# Patient Record
Sex: Male | Born: 1935 | Race: Black or African American | Hispanic: No | Marital: Single | Smoking: Never smoker
Health system: Southern US, Community
[De-identification: ages and names within clinical notes are randomized; demographics above are authoritative.]

## PROBLEM LIST (undated history)

## (undated) DIAGNOSIS — R413 Other amnesia: Secondary | ICD-10-CM

## (undated) DIAGNOSIS — C61 Malignant neoplasm of prostate: Secondary | ICD-10-CM

## (undated) DIAGNOSIS — I639 Cerebral infarction, unspecified: Secondary | ICD-10-CM

## (undated) DIAGNOSIS — H409 Unspecified glaucoma: Secondary | ICD-10-CM

## (undated) DIAGNOSIS — D128 Benign neoplasm of rectum: Secondary | ICD-10-CM

## (undated) HISTORY — DX: Benign neoplasm of rectum: D12.8

## (undated) HISTORY — DX: Other amnesia: R41.3

## (undated) HISTORY — DX: Unspecified glaucoma: H40.9

## (undated) HISTORY — DX: Malignant neoplasm of prostate: C61

---

## 2001-02-04 ENCOUNTER — Ambulatory Visit (HOSPITAL_BASED_OUTPATIENT_CLINIC_OR_DEPARTMENT_OTHER): Admission: RE | Admit: 2001-02-04 | Discharge: 2001-02-04 | Payer: Self-pay | Admitting: Urology

## 2001-12-17 ENCOUNTER — Encounter (HOSPITAL_BASED_OUTPATIENT_CLINIC_OR_DEPARTMENT_OTHER): Payer: Self-pay | Admitting: General Surgery

## 2001-12-22 ENCOUNTER — Ambulatory Visit (HOSPITAL_COMMUNITY): Admission: RE | Admit: 2001-12-22 | Discharge: 2001-12-23 | Payer: Self-pay | Admitting: General Surgery

## 2002-11-05 DIAGNOSIS — C61 Malignant neoplasm of prostate: Secondary | ICD-10-CM

## 2002-11-05 HISTORY — PX: OTHER SURGICAL HISTORY: SHX169

## 2002-11-05 HISTORY — DX: Malignant neoplasm of prostate: C61

## 2003-08-11 ENCOUNTER — Inpatient Hospital Stay (HOSPITAL_COMMUNITY): Admission: EM | Admit: 2003-08-11 | Discharge: 2003-08-12 | Payer: Self-pay | Admitting: Emergency Medicine

## 2003-08-11 ENCOUNTER — Encounter: Payer: Self-pay | Admitting: Emergency Medicine

## 2004-01-06 ENCOUNTER — Ambulatory Visit (HOSPITAL_COMMUNITY): Admission: RE | Admit: 2004-01-06 | Discharge: 2004-01-06 | Payer: Self-pay | Admitting: Ophthalmology

## 2004-02-22 ENCOUNTER — Ambulatory Visit (HOSPITAL_COMMUNITY): Admission: RE | Admit: 2004-02-22 | Discharge: 2004-02-22 | Payer: Self-pay | Admitting: Ophthalmology

## 2004-07-14 ENCOUNTER — Emergency Department (HOSPITAL_COMMUNITY): Admission: EM | Admit: 2004-07-14 | Discharge: 2004-07-14 | Payer: Self-pay | Admitting: Emergency Medicine

## 2005-01-12 ENCOUNTER — Ambulatory Visit: Payer: Self-pay | Admitting: Family Medicine

## 2005-02-22 ENCOUNTER — Ambulatory Visit: Payer: Self-pay | Admitting: Family Medicine

## 2005-03-13 ENCOUNTER — Ambulatory Visit: Payer: Self-pay | Admitting: Family Medicine

## 2005-05-29 ENCOUNTER — Ambulatory Visit: Payer: Self-pay | Admitting: Family Medicine

## 2005-05-30 ENCOUNTER — Ambulatory Visit: Payer: Self-pay | Admitting: Cardiovascular Disease

## 2005-06-01 ENCOUNTER — Ambulatory Visit (HOSPITAL_COMMUNITY): Admission: RE | Admit: 2005-06-01 | Discharge: 2005-06-01 | Payer: Self-pay | Admitting: Family Medicine

## 2005-06-04 ENCOUNTER — Ambulatory Visit: Payer: Self-pay

## 2005-06-08 ENCOUNTER — Ambulatory Visit (HOSPITAL_COMMUNITY): Admission: RE | Admit: 2005-06-08 | Discharge: 2005-06-08 | Payer: Self-pay | Admitting: Family Medicine

## 2005-08-27 ENCOUNTER — Ambulatory Visit: Payer: Self-pay | Admitting: Family Medicine

## 2006-03-07 ENCOUNTER — Ambulatory Visit: Payer: Self-pay | Admitting: Gastroenterology

## 2006-04-04 ENCOUNTER — Ambulatory Visit: Payer: Self-pay | Admitting: Family Medicine

## 2006-05-06 ENCOUNTER — Ambulatory Visit: Payer: Self-pay | Admitting: Family Medicine

## 2007-12-25 ENCOUNTER — Ambulatory Visit (HOSPITAL_COMMUNITY): Admission: RE | Admit: 2007-12-25 | Discharge: 2007-12-25 | Payer: Self-pay | Admitting: Ophthalmology

## 2008-02-10 ENCOUNTER — Ambulatory Visit: Admission: RE | Admit: 2008-02-10 | Discharge: 2008-05-10 | Payer: Self-pay | Admitting: Radiation Oncology

## 2008-03-24 ENCOUNTER — Ambulatory Visit (HOSPITAL_BASED_OUTPATIENT_CLINIC_OR_DEPARTMENT_OTHER): Admission: RE | Admit: 2008-03-24 | Discharge: 2008-03-24 | Payer: Self-pay | Admitting: Urology

## 2008-05-11 ENCOUNTER — Ambulatory Visit: Admission: RE | Admit: 2008-05-11 | Discharge: 2008-05-16 | Payer: Self-pay | Admitting: Radiation Oncology

## 2008-05-19 ENCOUNTER — Ambulatory Visit (HOSPITAL_COMMUNITY): Admission: RE | Admit: 2008-05-19 | Discharge: 2008-05-19 | Payer: Self-pay | Admitting: Ophthalmology

## 2010-07-20 ENCOUNTER — Encounter (INDEPENDENT_AMBULATORY_CARE_PROVIDER_SITE_OTHER): Payer: Self-pay | Admitting: *Deleted

## 2010-12-05 NOTE — Letter (Signed)
Summary: Colonoscopy Date Change Letter  [facility]  [address] [place], [place] [address]   Phone: [phone]  Fax: [phone]      July 20, 2010 MRN: [id number]   [facility] [address] [place], [place]  [address]   Dear [name],   Previously you were recommended to have a repeat colonoscopy around this time. Your chart was recently reviewed by Dr. Claudette Head of [facility]. Follow up colonoscopy is now recommended in November 2014. This revised recommendation is based on current, nationally recognized guidelines for colorectal cancer screening and polyp surveillance. These guidelines are endorsed by the American Cancer Society, [employer] on Colorectal Cancer as well as numerous other major medical organizations.  Please understand that our recommendation assumes that you do not have any new symptoms such as bleeding, a change in bowel habits, anemia, or significant abdominal discomfort. If you do have any concerning GI symptoms or want to discuss the guideline recommendations, please call to arrange an office visit at your earliest convenience. Otherwise we will keep you in our reminder system and contact you 1-2 months prior to the date listed above to schedule your next colonoscopy.  Thank you,  Judie Petit T. Russella Dar, M.D.  [facility] [phone]

## 2011-03-20 NOTE — Op Note (Signed)
NAME:  [name], [name]                ACCOUNT NO.:  [id number]   MEDICAL RECORD NO.:  [id number]          PATIENT TYPE:  AMB   LOCATION:  [facility]                         FACILITY:  [facility]   PHYSICIAN:  Lindaann Slough, M.D.  DATE OF BIRTH:  Oct 19, 1932   DATE OF PROCEDURE:  03/24/2008  DATE OF DISCHARGE:                               OPERATIVE REPORT   PREOPERATIVE DIAGNOSIS:  Adenocarcinoma of prostate.   POSTOPERATIVE DIAGNOSIS:  Adenocarcinoma of prostate.   PROCEDURE DONE:  I-125 seed implantation.   SURGEON:  Danae Chen, M.D. and Maryln Gottron, M.D.   ANESTHESIA:  General.   INDICATION:  The patient is a 77 year old male who had a positive  prostate biopsy for adenocarcinoma of prostate on January 19, 2008.  Gleason score 6.  PSA at diagnosis was 5.70.  Treatment options were  discussed with the patient and he elected to have seeds implantation.  He is scheduled today for the procedure.   Under general anesthesia, the patient was prepped and draped and placed  in the dorsal lithotomy position.  A Foley catheter was inserted in the  bladder.  A rectal tube was placed in the rectum and ultrasound planning  was done by Dr. Dayton Scrape.  When ultrasound planning was completed, a total  of 59 seeds were inserted in the prostate using 24 needles.  The  procedure was done under ultrasound guidance and using the Nucletron.  Then the Foley catheter was then removed.  A flexible cystoscope was  then inserted in the bladder.  The anterior urethra is normal.  He has  moderate prostatic hypertrophy.  The bladder mucosa is normal.  There is  no stone, seed or tumor in the bladder.  The ureteral orifices are in  normal position and shape with clear efflux.  The cystoscope was then  removed.  A #16 Foley catheter was then inserted in the bladder.   The patient tolerated the procedure well and left the OR in satisfactory  condition to post anesthesia care unit.      Lindaann Slough, M.D.  Electronically Signed     MN/MEDQ  D:  03/24/2008  T:  03/24/2008  Job:  [id number]   cc:   Maryln Gottron, M.D.  Fax: [phone]   Delaney Meigs, M.D.  Fax: [phone]

## 2011-03-20 NOTE — Op Note (Signed)
NAMEANTONE, SUMMONS                ACCOUNT NO.:  [id number]   MEDICAL RECORD NO.:  [id number]          PATIENT TYPE:  AMB   LOCATION:  SDS                          FACILITY:  [facility]   PHYSICIAN:  Salley Scarlet., M.D.DATE OF BIRTH:  [birth year]   DATE OF PROCEDURE:  12/25/2007  DATE OF DISCHARGE:  12/25/2007                               OPERATIVE REPORT      Salley Scarlet., M.D.  Electronically Signed     TB/MEDQ  D:  12/25/2007  T:  12/26/2007  Job:  [id number]

## 2011-03-23 NOTE — Op Note (Signed)
NAMEKENNEY, GOING                            ACCOUNT NO.:  [id number]   MEDICAL RECORD NO.:  [id number]                   PATIENT TYPE:  OIB   LOCATION:  2855                                 FACILITY:  [facility]   PHYSICIAN:  Salley Scarlet., M.D.         DATE OF BIRTH:  05/30/1932   DATE OF PROCEDURE:  01/06/2004  DATE OF DISCHARGE:  01/06/2004                                 OPERATIVE REPORT   PREOPERATIVE DIAGNOSIS:  Immature cataract right eye.   POSTOPERATIVE DIAGNOSIS:  Immature cataract right eye.   PROCEDURE:  Kelman phacoemulsification cataract with intraocular lens  implantation right eye.   SURGEON:  Nadyne Coombes, M.D.   ANESTHESIA:  Local using Xylocaine 2% with Marcaine 0.75% Wydase.   JUSTIFICATION FOR PROCEDURE:  This is a 77 year old diabetic gentleman who  complains of blurring of vision with difficulty seeing to read.  He was  evaluated and found to have a visual acuity best corrected as 20/70 on the  right and 20/50 on the left.  There were bilateral immature cataracts worse  on the right than the left.  Cataract extraction of the right eye was  recommended.  He was admitted, at this time, for that purpose.   DESCRIPTION OF PROCEDURE:  Under the influence with IV sedation, akinesia  and retrobulbar anesthesia was given.  The patient was prepped and draped in  the usual manner.  The lid speculum was inserted under the upper lower lid  of the right eye and a 4-0 silk traction suture was passed through the belly  of the superior rectus muscle retraction.  A Fornix based conjunctival flap  was turned and hemostasis was achieved by using cautery.  An incision was  made in the sclera approximately 1 mm posterior to the limbus.  This  incision was dissected down into the cornea using a crescent blade.  A strap  wound incision was made at the 135 position. Occucoat was injected into the  eye through the side port incision.   The anterior chamber was then  entered through the corneoscleral tunnel  incision at the 11:30 o'clock position.  An anterior capsulotomy was done  using the 25-gauge needle.  The nucleus was hydrodissected using Xylocaine.  The KTP handpiece was passed into the eye and the nucleus was emulsified  without difficulty.  The residual cortical material was aspirated.  The  posterior capsule was polished using high tech polisher.  A foldable  posterior chamber lens was then inserted into the eye behind the iris  without difficulty.  The anterior chamber was reformed and pupil was  constricted using Miochol.  The corneoscleral wound was tested to make sure  that there was no leak.   The conjunctiva was then closed over the wound using thermal cautery. One cc  of Celestone, 0.5 cc of gentamicin were injected subconjunctivally.  Maxitrol ophthalmic ointment and pilocarpine  ointment were applied along  with a  patch and Fox shield.  The patient tolerated the procedure well and  was discharged to the post-anesthesia care unit in  satisfactory condition.  He was instructed to rest today, to take Vicodin  q.4h. as needed for pain and to see me in the office tomorrow for further  evaluation.   DISCHARGE DIAGNOSIS:  Immature cataract right eye.                                               Salley Scarlet., M.D.    TB/MEDQ  D:  01/06/2004  T:  01/08/2004  Job:  [id number]

## 2011-03-23 NOTE — Discharge Summary (Signed)
NAMEELENA, COTHERN                            ACCOUNT NO.:  [id number]   MEDICAL RECORD NO.:  [id number]                   PATIENT TYPE:  INP   LOCATION:  0353                                 FACILITY:  [facility]   PHYSICIAN:  Malcolm T. Russella Dar, M.D. [facility]          DATE OF BIRTH:  1931/11/11   DATE OF ADMISSION:  08/11/2003  DATE OF DISCHARGE:  08/12/2003                                 DISCHARGE SUMMARY   ADMISSION DIAGNOSES:  46. A 77 year old, white male with hematemesis and syncope, rule out Mallory-     Weiss tear, rule out peptic ulcer disease.  2. Recent gum infection on clindamycin.  3. Remote inguinal hernia repair.  4. Status post bladder surgery, type unclear.   DISCHARGE DIAGNOSES:  1. Stable, status post acute upper gastrointestinal bleed secondary to     Mallory-Weiss tear, self-limited with no endoscopic intervention     required.  2. Incidental antral ulcer.  3. Anemia, normocytic secondary to above.   CONSULTATIONS:  None.   PROCEDURE:  Upper endoscopy.   HISTORY OF PRESENT ILLNESS:  Mr. [name] is a pleasant, 77 year old, African-  American male generally in good health with history of remote peptic ulcer  disease in the 1980s, but no history of GI bleeding that he can recall.  He  has had problems with his gums recently and has been having some scaling  done and has been on clindamycin.  He says he has been having some  indigestion and epigastric discomfort over the past week or so for which he  had been taking a couple of Advil per day.  He denies any heartburn,  dysphagia or odynophagia.  His appetite has been fine and he had not been  experiencing any nausea.  Earlier on the morning of admission, he became  rather acutely nauseated and then vomited x1 with a large amount of bright  red blood and clots.  He says he became dizzy and had a subsequent syncope  episode slumping down, but without any apparent injury and the patient  reports that he does not feel that  he hit his head.  He was brought to the  emergency room and found to be orthostatic, but not hypotensive and has been  stable in the emergency room.  His stool is dark and heme positive and he  has evidence of dried dark blood on his shoes and slacks.  Labs in the ER  showed a hemoglobin of 13.4, hematocrit 39.2, coagulations were normal.  BUN  was slightly elevated at 31, creatinine 1.  He was admitted to the hospital  with an acute upper GI bleed, rule out peptic ulcer disease versus Mallory-  Weiss tear for observation and upper endoscopy.   LABORATORY DATA AND X-RAY FINDINGS:  On admission, WBC 14.4, hemoglobin  13.4, hematocrit 39.2, platelets 312.  Electrolytes within normal limits.  BUN 31, creatinine 1.  UA was negative.  Follow up on October 7, with  hemoglobin down to 10.2 with hematocrit of 30.2.  H. pylori antibody was  drawn and is pending at the time of dictation.   HOSPITAL COURSE:  The patient was admitted to the service of Dr. Claudette Head who was covering the hospital.  He was initially placed on telemetry  and kept at bed rest, hydrated with serial H&H's obtained.  He was covered  with IV Protonix.  He did not have any further evidence of active bleeding,  no further hematemesis.  He underwent upper endoscopy on the morning of  October 7, and was found to have a Mallory-Weiss tear in the gastric cardia  which was not bleeding and did not require any therapy.  He was also noted  to have a 6 mm antral ulcer which was clean based.  CLOtest was done and  also noted duodenal erosions.  As it was felt that he was stable and was not  having any evidence of further active bleeding, he was allowed discharge to  home on October 7, with plans to have followup CBC in five days.  He was  scheduled to see Dr. Russella Dar in the office on October 21, at 11:15 a.m. and at  that time will discuss outpatient colonoscopy as well for surveillance.   DISCHARGE MEDICATIONS:  1. Zyrtec 10 mg  q.d.  2. Nexium 40 mg q.d.   SPECIAL INSTRUCTIONS:  Avoid all aspirin and aspirin products.  He could  continue his clindamycin if he did not feel it was causing any problems with  nausea.   DIET:  Full liquids on the day of discharge and advance to soft over the  next two to three days and then regular as tolerated.      Amy Esterwood, P.A.-C. [facility]                Malcolm T. Russella Dar, M.D. [facility]    AE/MEDQ  D:  08/23/2003  T:  08/23/2003  Job:  [id number]   cc:   Colon Flattery, MD  [address]  [place]  [place] [address]  Fax: [phone]

## 2011-03-23 NOTE — Op Note (Signed)
[facility]  Patient:    [name], [name] Visit Number: [id number] MRN: [id number]          Service Type: DSU Location: 3000 [id number] Attending Physician:  Sonda Primes Dictated by:   Mardene Celeste. Lurene Shadow, M.D. Proc. Date: 12/22/01 Admit Date:  12/22/2001 Discharge Date: 12/23/2001                             Operative Report  PREOPERATIVE DIAGNOSIS: Bilateral inguinal hernias.  POSTOPERATIVE DIAGNOSIS: Bilateral inguinal hernias.  OPERATION/PROCEDURE: Repair of bilateral inguinal hernias with mesh.  SURGEON: Mardene Celeste. Lurene Shadow, M.D.  ASSISTANT: Nurse.  ANESTHESIA: General.  INDICATIONS FOR PROCEDURE: The patient is a 77 year old man, presenting with bilateral inguinal hernias, the right side being greater than the left.  The hernias have been essentially asymptomatic but the right one had been increasing in size over the past few months.  The risks and benefits of surgery for hernia repair have been discussed with the patient, all questions answered, and consent obtained.  DESCRIPTION OF PROCEDURE: Following the induction of satisfactory general anesthesia, with the patient positioned supine, the lower abdomen was prepped and draped to be included in the sterile operative field.  I started on the left side.  I infiltrated the lower abdominal crease with 0.5% Marcaine with epinephrine 1:200,000 concentration.  I made a transverse incision in the lower abdominal crease and deepened this throughout the skin and subcutaneous tissues, carrying dissection down to the external oblique aponeurosis.  The external oblique aponeurosis was opened up through the external inguinal ring, with traction on the ilioinguinal nerve medially and cephalad.  The spermatic cord was then dissected free and elevated with a Penrose drain.  Dissection along the anterior medial aspect of the cord did not reveal any evidence of an indirect sac.  There was a  modestly large and prolapsing left inguinal hernia in the direct space.  This was repaired with Marlex mesh sewn in at the pubic tubercle, carried up along the conjoined tendon to the internal ring, and again from the pubic tubercle up along the shelving edge of Pouparts ligament to the internal ring.  Mesh was split so as to allow the cord to protrude. Tails of the mesh were then trimmed and sutured down into the internal oblique muscle completely occluding the internal ring save for the spermatic cord. Sponge, instrument, and sharp counts on the left side were then verified.  The external oblique aponeurosis was then closed with a running suture of 2-0 Vicryl after all the areas had been suctioned and checked for hemostasis and noted to be dry.  Subcutaneous and Scarpas fascia were closed with a running suture of 3-0 Vicryl.  Attention was then turned to the right side. Similarly, the right side was infiltrated with 0.5% Marcaine with epinephrine 1:200,000.  A transverse incision was made symmetrically on the right side, with dissection carried down into the external oblique aponeurosis.  The aponeurosis was opened up throughout the external inguinal hernia ring with retraction of the ilioinguinal nerve laterally and inferiorly.  Spermatic cord was elevated and held with a Penrose drain.  A rather large direct hernia could be seen immediately.  There was no evidence of an indirect hernia on dissection within the spermatic cord.  The hernia was repaired onlay mesh of polypropylene starting at the pubic tubercle and carried up along the conjoined tendon with a 2-0 Novofil suture up to the internal  ring, and again from the pubic tubercle up along the the shelving edge of Pouparts ligament to the internal ring.  The mesh was split so as to allow the cord to protrude and the tails of the mesh were trimmed and then sutured down at the internal oblique muscle, completing the occlusion of the  internal ring.  Again, sponge, instruments, and sharp counts were verified.  All areas of dissection were checked for hemostasis and noted to be dry.  External oblique aponeurosis was closed with a running suture of 2-0 Vicryl, Scarpas fascia and subcutaneous tissues closed with running 3-0 Vicryl.  Skin was closed on both sides with running sutures of 4-0 Monocryl and then reinforced with Steri-Strips.  A sterile compressive dressing was applied to both wounds.  The anesthetic was reversed, the patient removed from the operating room to the recovery room in stable condition.  He tolerated the procedure well. Dictated by:   Mardene Celeste. Lurene Shadow, M.D. Attending Physician:  Sonda Primes DD:  12/22/01 TD:  12/22/01 Job: [id number]

## 2011-03-23 NOTE — Op Note (Signed)
NAME:  JARYN, ROSKO                ACCOUNT NO.:  [id number]   MEDICAL RECORD NO.:  [id number]          PATIENT TYPE:  AMB   LOCATION:  SDS                          FACILITY:  [facility]   PHYSICIAN:  Salley Scarlet., M.D.DATE OF BIRTH:  1932-02-21   DATE OF PROCEDURE:  01/09/2008  DATE OF DISCHARGE:  12/25/2007                               OPERATIVE REPORT   PREOPERATIVE DIAGNOSIS:  Immature cataract, left eye.   POSTOPERATIVE DIAGNOSIS:  Immature cataract, left eye.   PROCEDURE:  Cataract extraction, left eye, with intraocular lens  implantation.   ANESTHESIA:  Local using Xylocaine 2% with Marcaine, 0.75% Wydase.   JUSTIFICATION FOR PROCEDURE:  This is a 77 year old gentleman who  underwent a cataract extraction of the right eye with good visual  results several months ago.  He complains of inability to see from the  left eye which causes him difficulty seeing to read and drive.  He was  evaluated and found have a dense posterior subcapsular cataract of the  left eye.  Cataract extraction of the left eye was recommended.  He is  admitted at this time for that purpose.   PROCEDURE:  Under the influence of IV sedation, Van Lint akinesia and  retrobulbar anesthesia was given.  The patient was prepped and draped in  the usual manner.  A lid speculum was inserted under the upper and lower  lid of the left eye, and a 4-0 silk traction suture was passed through  the belly of the superior rectus muscle for retraction.  A fornix-based  conjunctival flap was turned, and hemostasis was achieved using cautery.  An incision was made in the sclera at the limbus.  This incision was  dissected down to clear cornea using a crescent blade.  A sideport  incision was made at the 1:30 o'clock position.  Ocucoat was injected  into the eye through the sideport incision.  The anterior chamber was  then entered through the corneoscleral tunnel incision at the 11:30  o'clock position.  An anterior  capsulotomy was done using a bent 25-  gauge needle.  The nucleus was hydrodissected using Xylocaine.  The KPE  handpiece was passed into the eye, and the nucleus was emulsified  without difficulty.  The residual cortical material was aspirated.  The  posterior capsule was polished using an olive tip polisher.  The wound  was widened slightly to accommodate a foldable silicone lens.  The lens  was seated into the eye behind the iris without difficulty.  The  anterior chamber was reformed, and the pupil was constricted using  Miochol.  The lips of the wound were hydrated and tested to make sure  that there was no leak.  After ascertaining that there was no leak, the  conjunctiva was closed over the wound using thermal cautery.  1 mL of  Celestone and 0.5 mL of gentamicin were injected subconjunctivally.  Maxitrol ophthalmic ointment and prilocaine antibiotic ointment were  applied along with a patch and Fox shield.   The patient tolerated the procedure well and was discharged to the  postanesthesia recovery room  in satisfactory condition.  He was  instructed to rest today, to take Vicodin every 4 hours as needed for  pain, and to see me in office tomorrow for further evaluation.   DISCHARGE DIAGNOSIS:  Immature cataract, left eye.      Salley Scarlet., M.D.  Electronically Signed     TB/MEDQ  D:  01/09/2008  T:  01/10/2008  Job:  [id number]

## 2011-03-23 NOTE — H&P (Signed)
NAMEANTONIA, [name]                            ACCOUNT NO.:  [id number]   MEDICAL RECORD NO.:  [id number]                   PATIENT TYPE:  INP   LOCATION:  0353                                 FACILITY:  [facility]   PHYSICIAN:  Malcolm T. Russella Dar, M.D. [facility]          DATE OF BIRTH:  October 25, 1932   DATE OF ADMISSION:  08/11/2003  DATE OF DISCHARGE:                                HISTORY & PHYSICAL   CHIEF COMPLAINT:  Vomiting blood.   HISTORY:  [name] is a pleasant 77 year old African-American male  generally in good health who has history of peptic ulcer disease in the  1980s, no prior history of GI bleeding.  He does have seasonal allergies and  a recent gum infection for which he is on clindamycin.  He relates he had  had some indigestion and epigastric discomfort over the past week for which  he had been taking some Advil two to three per day.  He denied any  heartburn.  No dysphagia or odynophagia.  His appetite has been fine.  His  weight has been stable.  Earlier on the morning of admission he became  nauseated and vomited x1 with a large amount of bright red blood and clots.  He says he became dizzy and had a subsequent syncopal episode slumping  down, but apparently had no obvious injury and does not feel that he hit his  head.  He was brought to the emergency room, found to be orthostatic, but  not hypotensive and has been stable.  His stool is dark, but heme-positive.  He does have dark dried blood on his shoes and slacks.  Laboratories in the  ER showed a hemoglobin of 13.4, hematocrit of 39.2, WBC of 14, platelets  312,000, pro time 12.9, INR 1, potassium 4.1, BUN 31, creatinine 1.0.  UA is  negative.  EKG showed normal sinus rhythm.  He is admitted at this time with  an acute upper GI bleed, rule out peptic ulcer disease versus Mallory-Weiss  tear.   CURRENT MEDICATIONS:  1. Clindamycin 150 t.i.d.  2. Zyrtec 10 daily.   ALLERGIES:  No known drug allergies.   PAST  MEDICAL HISTORY:  1. Remote inguinal hernia repair.  2. Status post bladder surgery two to three years ago.  He is uncertain of     the exact procedure.   FAMILY HISTORY:  Pertinent for diabetes mellitus.   SOCIAL HISTORY:  The patient lives alone.  Has grown children here in town.  He is retired from [employer].  He has a large extended family with several family  members present in the emergency room.  He had moved back to [place] from  [place] within the past couple of years.   REVIEW OF SYSTEMS:  Reviewed and cardiovascular, pulmonary, genitourinary,  musculoskeletal all negative.  Specifically, denies any chest pain or  anginal symptoms prior to his syncopal episode.  No shortness of  breath.  Denies any pain or discomfort post fall.  Specifically, denies headache.   PHYSICAL EXAMINATION:  GENERAL:  Well-developed African-American male in no  acute distress.  He is alert and oriented x3.  VITAL SIGNS:  Temperature 97, blood pressure 142/64, pulse in the 70s.  HEENT:  Normocephalic, atraumatic.  EOMI.  PERLA.  Sclerae anicteric.  NECK:  Supple without nodes.  CARDIOVASCULAR:  Regular rate and rhythm with S1 and S2.  PULMONARY:  Clear to A&P.  ABDOMEN:  Soft, basically nontender.  No mass or hepatosplenomegaly.  Bowel  sounds active.  RECTAL:  Per the ER.  Dark green stool.  4+ positive for occult blood.  EXTREMITIES:  Without clubbing, cyanosis, edema.  Pulses are equal.  NEUROLOGIC:  Nonfocal.   IMPRESSION:  24. A 77 year old African-American male with acute upper gastrointestinal     bleed with syncope and orthostasis currently stable.  Rule out Mallory-     Weiss tear.  Rule out peptic ulcer disease.  2. Recent gum infection on clindamycin.  3. Remote hernia repair.  4. Status post bladder surgery, type unclear.   PLAN:  The patient is admitted to the service of Judie Petit T. Russella Dar, M.D. [facility]  to telemetry.  He will be hydrated, volume repleted, serial H&H, then  transfusions  as indicated.  Have scheduled upper endoscopy for early a.m.  This will be done emergently if he becomes unstable in the interim.  He will  be placed on IV PPI and for further details please see the orders.     Amy Esterwood, P.A.-C. [facility]                Malcolm T. Russella Dar, M.D. [facility]    AE/MEDQ  D:  08/12/2003  T:  08/12/2003  Job:  [id number]   cc:   Colon Flattery, MD  [address]  [place]  [place] [address]  Fax: [phone]

## 2011-07-27 LAB — URINE MICROSCOPIC-ADD ON

## 2011-07-27 LAB — CBC
HCT: 39.6
MCHC: 34
MCV: 92.6
RBC: 4.28
WBC: 7.8

## 2011-07-27 LAB — URINALYSIS, ROUTINE W REFLEX MICROSCOPIC
Bilirubin Urine: NEGATIVE
Glucose, UA: NEGATIVE
Hgb urine dipstick: NEGATIVE
Ketones, ur: NEGATIVE
Protein, ur: NEGATIVE
pH: 5.5

## 2011-07-27 LAB — BASIC METABOLIC PANEL
BUN: 16
CO2: 25
Chloride: 107
Creatinine, Ser: 1.11
GFR calc Af Amer: >60
Potassium: 4.2

## 2011-08-01 LAB — COMPREHENSIVE METABOLIC PANEL
ALT: 20
AST: 21
Alkaline Phosphatase: 67
CO2: 28
Calcium: 9.5
Chloride: 104
GFR calc Af Amer: >60
GFR calc non Af Amer: >60
Glucose, Bld: 68 — ABNORMAL LOW
Potassium: 4.7
Sodium: 138

## 2011-08-01 LAB — CBC
Hemoglobin: 13.7
MCHC: 33.5
RBC: 4.45
WBC: 7

## 2011-08-01 LAB — PROTIME-INR: Prothrombin Time: 13.2

## 2013-06-19 ENCOUNTER — Encounter: Payer: Self-pay | Admitting: Gastroenterology

## 2013-07-29 ENCOUNTER — Encounter: Payer: Self-pay | Admitting: Gastroenterology

## 2013-09-24 ENCOUNTER — Ambulatory Visit (AMBULATORY_SURGERY_CENTER): Payer: Self-pay | Admitting: *Deleted

## 2013-09-24 VITALS — Ht 71.0 in | Wt 172.2 lb

## 2013-09-24 DIAGNOSIS — Z1211 Encounter for screening for malignant neoplasm of colon: Secondary | ICD-10-CM

## 2013-09-24 MED ORDER — MOVIPREP 100 G PO SOLR: ORAL | Status: DC

## 2013-09-25 ENCOUNTER — Encounter: Payer: Self-pay | Admitting: Gastroenterology

## 2013-10-08 ENCOUNTER — Ambulatory Visit (AMBULATORY_SURGERY_CENTER): Payer: Medicare Other | Admitting: Gastroenterology

## 2013-10-08 ENCOUNTER — Encounter: Payer: Self-pay | Admitting: Gastroenterology

## 2013-10-08 VITALS — BP 133/76 | HR 51 | Temp 97.3°F | Resp 15 | Ht 71.0 in | Wt 172.0 lb

## 2013-10-08 DIAGNOSIS — D126 Benign neoplasm of colon, unspecified: Secondary | ICD-10-CM

## 2013-10-08 DIAGNOSIS — Z1211 Encounter for screening for malignant neoplasm of colon: Secondary | ICD-10-CM

## 2013-10-08 DIAGNOSIS — D128 Benign neoplasm of rectum: Secondary | ICD-10-CM

## 2013-10-08 HISTORY — DX: Benign neoplasm of rectum: D12.8

## 2013-10-08 MED ORDER — SODIUM CHLORIDE 0.9 % IV SOLN: 500.0000 mL | INTRAVENOUS | Status: DC

## 2013-10-08 NOTE — Progress Notes (Signed)
Report to pacu rn, vss, bbs=clear 

## 2013-10-08 NOTE — Op Note (Signed)
[facility] [address] [place], [address]   COLONOSCOPY PROCEDURE REPORT  PATIENT: [name], [name]  MR#: [id number] BIRTHDATE: [birth year] , 81  yrs. old GENDER: Male ENDOSCOPIST: Meryl Dare, MD, [facility] PROCEDURE DATE:  10/08/2013 PROCEDURE:   Colonoscopy with biopsy and snare polypectomy First Screening Colonoscopy - Avg.  risk and is 50 yrs.  old or older - No.  Prior Negative Screening - Now for repeat screening. 10 or more years since last screening  History of Adenoma - Now for follow-up colonoscopy & has been > or = to 3 yrs.  N/A  Polyps Removed Today? Yes. ASA CLASS:   Class II INDICATIONS:average risk screening. MEDICATIONS: MAC sedation, administered by CRNA and propofol (Diprivan) 230mg  IV DESCRIPTION OF PROCEDURE:   After the risks benefits and alternatives of the procedure were thoroughly explained, informed consent was obtained.  A digital rectal exam revealed no abnormalities of the rectum.   The LB [id number]  endoscope was introduced through the anus and advanced to the cecum, which was identified by both the appendix and ileocecal valve. No adverse events experienced.   The quality of the prep was good, using MoviPrep  The instrument was then slowly withdrawn as the colon was fully examined.  COLON FINDINGS: Two sessile polyps measuring 4-5 mm in size were found in the transverse colon.  A polypectomy was performed with a cold snare and with cold forceps.   The colon was otherwise normal. There was no diverticulosis, inflammation, polyps or cancers unless previously stated.  Retroflexed views revealed small internal hemorrhoids. The time to cecum=3 minutes 29 seconds. Withdrawal time=9 minutes 38 seconds.  The scope was withdrawn and the procedure completed. COMPLICATIONS: There were no complications.  ENDOSCOPIC IMPRESSION: 1.   Two sessile polyps measuring 4-5 mm in the transverse colon; polypectomy performed with a cold snare  and with cold forceps 2.   Small internal hemorrhoids  RECOMMENDATIONS: 1.  Await pathology results 2.  Given your age, you will not need another colonoscopy for colon cancer screening or polyp surveillance.  These types of tests usually stop around the age 76.  eSigned:  Meryl Dare, MD, [facility] 10/08/2013 8:28 AM   cc: Joette Catching, MD

## 2013-10-08 NOTE — Patient Instructions (Signed)

## 2013-10-08 NOTE — Progress Notes (Signed)
Called to room to assist during endoscopic procedure.  Patient ID and intended procedure confirmed with present staff. Received instructions for my participation in the procedure from the performing physician.  

## 2013-10-08 NOTE — Progress Notes (Signed)
Pt rolled to right side.  HOB dropped with feet up.  Pt encouraged to pass gas. Maw

## 2013-10-08 NOTE — Progress Notes (Signed)
Pt did pass a good amount of flatus.  Pt's abdomen is soft.  No complaints noted.  Maw

## 2013-10-09 ENCOUNTER — Telehealth: Payer: Self-pay

## 2013-10-09 NOTE — Telephone Encounter (Signed)
  Follow up Call-  Call back number 10/08/2013  Post procedure Call Back phone  # [phone]  Permission to leave phone message Yes     Patient questions:  Do you have a fever, pain , or abdominal swelling? no Pain Score  0 *  Have you tolerated food without any problems? yes  Have you been able to return to your normal activities? yes  Do you have any questions about your discharge instructions: Diet   no Medications  no Follow up visit  no  Do you have questions or concerns about your Care? no  Actions: * If pain score is 4 or above: No action needed, pain <4.

## 2013-10-13 ENCOUNTER — Encounter: Payer: Self-pay | Admitting: Gastroenterology

## 2013-10-13 ENCOUNTER — Telehealth: Payer: Self-pay | Admitting: Gastroenterology

## 2013-10-13 NOTE — Telephone Encounter (Signed)
Patient c/o right sided abdominal pain since the procedure on 10/08/13.  He had two polyps removed via cold snare and cold forceps.  He denies fever,  rectal bleeding or constipation. He has not had any vomiting for the last two days.  Upon further questioning the patient admits to the same pain being present prior to the procedure.  He will come in and see Willette Cluster RNP tomorrow at 10:00

## 2013-10-13 NOTE — Telephone Encounter (Signed)
Agree with office evaluation tomorrow.  I don't think this is procedure related.

## 2013-10-14 ENCOUNTER — Ambulatory Visit (INDEPENDENT_AMBULATORY_CARE_PROVIDER_SITE_OTHER): Payer: Medicare Other | Admitting: Nurse Practitioner

## 2013-10-14 ENCOUNTER — Other Ambulatory Visit (INDEPENDENT_AMBULATORY_CARE_PROVIDER_SITE_OTHER): Payer: Medicare Other

## 2013-10-14 ENCOUNTER — Encounter: Payer: Self-pay | Admitting: Nurse Practitioner

## 2013-10-14 VITALS — BP 128/64 | HR 74 | Ht 71.0 in | Wt 167.0 lb

## 2013-10-14 DIAGNOSIS — R109 Unspecified abdominal pain: Secondary | ICD-10-CM | POA: Insufficient documentation

## 2013-10-14 DIAGNOSIS — R112 Nausea with vomiting, unspecified: Secondary | ICD-10-CM | POA: Insufficient documentation

## 2013-10-14 LAB — CBC WITH DIFFERENTIAL/PLATELET
Basophils Relative: 0.1 % (ref 0.0–3.0)
Eosinophils Absolute: 0 10*3/uL (ref 0.0–0.7)
Eosinophils Relative: 0 % (ref 0.0–5.0)
HCT: 40.7 % (ref 39.0–52.0)
Lymphs Abs: 1.2 10*3/uL (ref 0.7–4.0)
MCHC: 34.2 g/dL (ref 30.0–36.0)
MCV: 91.8 fl (ref 78.0–100.0)
Monocytes Absolute: 1.6 10*3/uL — ABNORMAL HIGH (ref 0.1–1.0)
Neutrophils Relative %: 82.9 % — ABNORMAL HIGH (ref 43.0–77.0)
Platelets: 288 10*3/uL (ref 150.0–400.0)
WBC: 16.4 10*3/uL — ABNORMAL HIGH (ref 4.5–10.5)

## 2013-10-14 LAB — COMPREHENSIVE METABOLIC PANEL
AST: 16 U/L (ref 0–37)
Alkaline Phosphatase: 64 U/L (ref 39–117)
BUN: 16 mg/dL (ref 6–23)
Glucose, Bld: 121 mg/dL — ABNORMAL HIGH (ref 70–99)
Sodium: 141 mEq/L (ref 135–145)
Total Bilirubin: 2.9 mg/dL — ABNORMAL HIGH (ref 0.3–1.2)

## 2013-10-14 MED ORDER — METHOCARBAMOL 500 MG PO TABS: ORAL_TABLET | ORAL | Status: DC

## 2013-10-14 MED ORDER — TRAMADOL HCL 50 MG PO TABS: 50.0000 mg | ORAL_TABLET | Freq: Four times a day (QID) | ORAL | Status: DC | PRN

## 2013-10-14 MED ORDER — ESOMEPRAZOLE MAGNESIUM 40 MG PO CPDR: 40.0000 mg | DELAYED_RELEASE_CAPSULE | Freq: Every day | ORAL | Status: DC

## 2013-10-14 NOTE — Patient Instructions (Addendum)
Please go to the basement level to have your labs drawn.  We faxed a signed prescription for Tramadol to your pharmacy. Take over the counter Motrin 200 mg, Take 1 tab twice daily.  We sent prescriptions to [facility]. 1. Robaxin 2. Ultram ( Tramadol ) for pain 3. Nexium 40 mg. Take 1 tab in the am   For 14 days.

## 2013-10-14 NOTE — Progress Notes (Signed)
     History of Present Illness:  Patient is a relatively healthy 79 year old male who had a screening colonoscopy with [place] 10/08/13 . He is here today for evaluation of abdominal pain, nausea and vomiting which started following the procedure . According to the telephone call note yesterday, symptoms were probably present prior to procedure but patient adamantly denies that today . The right sided abdominal pain is intermittent, mainly positional. Patient has slept in a rocking chair since the procedure because it hurts to lay on left side. Pain is also exacerbated by twisting.  It can be uncomfortable to walk. Pain is not related to eating . Since colonoscopy patient has also had several episodes of nausea and vomiting, as recent as yesterday. His appetite has diminished. No fevers. He is not having much in the way of bowel movements since colonoscopy.  Current Medications, Allergies, Past Medical History, Past Surgical History, Family History and Social History were reviewed in [employer] record.  Physical Exam: General: Pleasant, well developed , black male in no acute distress Head: Normocephalic and atraumatic Eyes:  sclerae anicteric, conjunctiva pink  Ears: Normal auditory acuity Lungs: Clear throughout to auscultation Heart: Regular rate and rhythm Abdomen: Soft, non distended, mild right mid abdominal / RUQ tenderness. Negative Carnett's. No obvious masses.  Normal bowel sounds Musculoskeletal: Symmetrical with no gross deformities  Extremities: No edema  Neurological: Alert oriented x 4, grossly nonfocal Psychological:  Alert and cooperative. Normal mood and affect  Assessment and Recommendations:  Nausea, vomiting, right-sided abdominal pain. Symptoms present since colonoscopy with polypectomy on the fourth of this month. Doubt symptoms are related to colonoscopy. His pain may be musculoskeletal given its relationship to position/movement. I am not sure what's  causing the nausea and vomiting. Some diagnostic considerations include gallbladder, PUD.   Will obtain CBC, CMET.   Trial of Motrin 200mg  BID and Robaxin BID for 7 days. Samples of Nexium  daily x 2 weeks for mucosal protection while taking NSAID.   If no improvement in symptoms he may need abdominal imaging.   Trial of ultram for pain.   Asked patient to call for lab results and to give [place] a condition update in two days. He knows to call in the interim for worsening pain and /or fever.

## 2013-10-14 NOTE — Progress Notes (Signed)
Reviewed and agree with management plan.  Malcolm T. Stark, MD FACG 

## 2013-10-15 ENCOUNTER — Other Ambulatory Visit: Payer: Self-pay | Admitting: *Deleted

## 2013-10-15 DIAGNOSIS — R945 Abnormal results of liver function studies: Secondary | ICD-10-CM

## 2013-10-15 DIAGNOSIS — R112 Nausea with vomiting, unspecified: Secondary | ICD-10-CM

## 2013-10-15 DIAGNOSIS — R109 Unspecified abdominal pain: Secondary | ICD-10-CM

## 2013-10-15 DIAGNOSIS — R7989 Other specified abnormal findings of blood chemistry: Secondary | ICD-10-CM

## 2013-10-16 ENCOUNTER — Ambulatory Visit (HOSPITAL_COMMUNITY)
Admission: RE | Admit: 2013-10-16 | Discharge: 2013-10-16 | Disposition: A | Discharge status: Home / Self Care | Payer: Medicare Other | Source: Ambulatory Visit | Attending: Nurse Practitioner | Admitting: Nurse Practitioner

## 2013-10-16 DIAGNOSIS — R112 Nausea with vomiting, unspecified: Secondary | ICD-10-CM

## 2013-10-16 DIAGNOSIS — R109 Unspecified abdominal pain: Secondary | ICD-10-CM

## 2013-10-16 DIAGNOSIS — R7989 Other specified abnormal findings of blood chemistry: Secondary | ICD-10-CM | POA: Insufficient documentation

## 2013-10-16 DIAGNOSIS — K7689 Other specified diseases of liver: Secondary | ICD-10-CM | POA: Insufficient documentation

## 2013-10-16 DIAGNOSIS — R945 Abnormal results of liver function studies: Secondary | ICD-10-CM

## 2013-10-16 DIAGNOSIS — N281 Cyst of kidney, acquired: Secondary | ICD-10-CM | POA: Insufficient documentation

## 2013-10-19 ENCOUNTER — Other Ambulatory Visit (INDEPENDENT_AMBULATORY_CARE_PROVIDER_SITE_OTHER): Payer: Medicare Other

## 2013-10-19 DIAGNOSIS — R112 Nausea with vomiting, unspecified: Secondary | ICD-10-CM

## 2013-10-19 DIAGNOSIS — R945 Abnormal results of liver function studies: Secondary | ICD-10-CM

## 2013-10-19 DIAGNOSIS — R7989 Other specified abnormal findings of blood chemistry: Secondary | ICD-10-CM

## 2013-10-19 DIAGNOSIS — R109 Unspecified abdominal pain: Secondary | ICD-10-CM

## 2013-10-19 LAB — HEPATIC FUNCTION PANEL
ALT: 14 U/L (ref 0–53)
AST: 17 U/L (ref 0–37)
Alkaline Phosphatase: 54 U/L (ref 39–117)
Bilirubin, Direct: 0.1 mg/dL (ref 0.0–0.3)
Total Bilirubin: 0.7 mg/dL (ref 0.3–1.2)

## 2013-10-19 LAB — BASIC METABOLIC PANEL
BUN: 16 mg/dL (ref 6–23)
CO2: 24 mEq/L (ref 19–32)
Calcium: 9.1 mg/dL (ref 8.4–10.5)
GFR: 49.82 mL/min — ABNORMAL LOW (ref >60.00)
Glucose, Bld: 116 mg/dL — ABNORMAL HIGH (ref 70–99)
Potassium: 4.2 mEq/L (ref 3.5–5.1)
Sodium: 140 mEq/L (ref 135–145)

## 2013-10-19 LAB — CBC WITH DIFFERENTIAL/PLATELET
Basophils Absolute: 0 10*3/uL (ref 0.0–0.1)
Eosinophils Relative: 1.7 % (ref 0.0–5.0)
Lymphocytes Relative: 15.3 % (ref 12.0–46.0)
Monocytes Relative: 9.1 % (ref 3.0–12.0)
Neutrophils Relative %: 73.6 % (ref 43.0–77.0)
Platelets: 334 10*3/uL (ref 150.0–400.0)
RDW: 14.3 % (ref 11.5–14.6)
WBC: 7.7 10*3/uL (ref 4.5–10.5)

## 2013-11-03 ENCOUNTER — Telehealth: Payer: Self-pay | Admitting: *Deleted

## 2013-11-03 DIAGNOSIS — K7689 Other specified diseases of liver: Secondary | ICD-10-CM

## 2013-11-03 NOTE — Telephone Encounter (Signed)
Message copied by Richardson Chiquito on Tue Nov 03, 2013 10:59 AM ------      Message from: Meredith Pel      Created: Mon Nov 02, 2013  4:38 PM       Rene Kocher, can you set patient up for MRI for evaluation of liver cysts please. Thanks      ----- Message -----         From: Meryl Dare, MD         Sent: 10/16/2013   6:26 PM           To: Meredith Pel, NP            Yes, MRI with and without contrast as recommended.      GU evaluation of renal cysts and symptoms.            ----- Message -----         From: Meredith Pel, NP         Sent: 10/16/2013   4:32 PM           To: Meryl Dare, MD            Would you like to obtain MRI for evaluation of liver cysts. Will send report to PCP for evaluation of renal cysts. Not sure why the abdominal pain, elevated WBC. He was feeling better when Rene Kocher called him yesterday. I have ordered repeat CBC for Monday.             ------

## 2013-11-03 NOTE — Telephone Encounter (Signed)
I have spoken to patient and have advised that ultrasound showed some enlarging liver cysts as well as some kidney cysts. He needs MRI abdomen for further evaluation of liver cysts. He has been set up for MRI abdomen with and without contrast on 11/09/13 @ 12:00 pm. No prep nessecary. Appt is at [facility] radiology. I advised that patient will need to have the kidney cysts further evaluated by his primary care doctor (I have sent a copy of the ultrasound to Dr Lysbeth Galas). Patient verbalizes understanding but would also like me to contact his brother, Felicity Pellegrini to discuss as he takes care of his medical concerns. I have contacted and spoken to [facility] and have given him all of the above information. He verbalizes understanding as well.

## 2013-11-09 ENCOUNTER — Ambulatory Visit (HOSPITAL_COMMUNITY)
Admission: RE | Admit: 2013-11-09 | Discharge: 2013-11-09 | Disposition: A | Discharge status: Home / Self Care | Payer: Medicare Other | Source: Ambulatory Visit | Attending: Nurse Practitioner | Admitting: Nurse Practitioner

## 2013-11-09 DIAGNOSIS — M47817 Spondylosis without myelopathy or radiculopathy, lumbosacral region: Secondary | ICD-10-CM | POA: Insufficient documentation

## 2013-11-09 DIAGNOSIS — K7689 Other specified diseases of liver: Secondary | ICD-10-CM | POA: Insufficient documentation

## 2013-11-09 DIAGNOSIS — R112 Nausea with vomiting, unspecified: Secondary | ICD-10-CM | POA: Insufficient documentation

## 2013-11-09 DIAGNOSIS — Z8546 Personal history of malignant neoplasm of prostate: Secondary | ICD-10-CM | POA: Insufficient documentation

## 2013-11-09 DIAGNOSIS — Q618 Other cystic kidney diseases: Secondary | ICD-10-CM | POA: Insufficient documentation

## 2013-11-09 MED ORDER — GADOBENATE DIMEGLUMINE 529 MG/ML IV SOLN
15.0000 mL | Freq: Once | INTRAVENOUS | Status: AC | PRN
  Administered 2013-11-09: 15 mL via INTRAVENOUS

## 2013-11-13 ENCOUNTER — Telehealth: Payer: Self-pay | Admitting: Nurse Practitioner

## 2013-11-13 NOTE — Telephone Encounter (Signed)
Already spoke with patient this AM. Called him and told him I was just trying to reach him and tell him to call Dr. Edrick Oh for OV which we have already discussed.

## 2014-07-03 ENCOUNTER — Encounter (HOSPITAL_COMMUNITY): Payer: Self-pay | Admitting: Emergency Medicine

## 2014-07-03 ENCOUNTER — Emergency Department (HOSPITAL_COMMUNITY): Payer: Medicare Other

## 2014-07-03 ENCOUNTER — Inpatient Hospital Stay (HOSPITAL_COMMUNITY)
Admission: EM | Admit: 2014-07-03 | Discharge: 2014-07-07 | DRG: 481 | Disposition: A | Discharge status: Skilled Nursing Facility | Payer: Medicare Other | Attending: Family Medicine | Admitting: Family Medicine

## 2014-07-03 DIAGNOSIS — D62 Acute posthemorrhagic anemia: Secondary | ICD-10-CM | POA: Diagnosis not present

## 2014-07-03 DIAGNOSIS — S7290XA Unspecified fracture of unspecified femur, initial encounter for closed fracture: Secondary | ICD-10-CM | POA: Diagnosis present

## 2014-07-03 DIAGNOSIS — Z7901 Long term (current) use of anticoagulants: Secondary | ICD-10-CM | POA: Diagnosis not present

## 2014-07-03 DIAGNOSIS — W19XXXA Unspecified fall, initial encounter: Secondary | ICD-10-CM | POA: Diagnosis present

## 2014-07-03 DIAGNOSIS — S7223XA Displaced subtrochanteric fracture of unspecified femur, initial encounter for closed fracture: Principal | ICD-10-CM | POA: Diagnosis present

## 2014-07-03 DIAGNOSIS — Z79899 Other long term (current) drug therapy: Secondary | ICD-10-CM | POA: Diagnosis not present

## 2014-07-03 DIAGNOSIS — G8918 Other acute postprocedural pain: Secondary | ICD-10-CM | POA: Diagnosis not present

## 2014-07-03 DIAGNOSIS — Z8546 Personal history of malignant neoplasm of prostate: Secondary | ICD-10-CM | POA: Diagnosis not present

## 2014-07-03 DIAGNOSIS — H409 Unspecified glaucoma: Secondary | ICD-10-CM | POA: Diagnosis present

## 2014-07-03 DIAGNOSIS — I498 Other specified cardiac arrhythmias: Secondary | ICD-10-CM | POA: Diagnosis present

## 2014-07-03 DIAGNOSIS — S7292XA Unspecified fracture of left femur, initial encounter for closed fracture: Secondary | ICD-10-CM | POA: Diagnosis present

## 2014-07-03 DIAGNOSIS — R001 Bradycardia, unspecified: Secondary | ICD-10-CM

## 2014-07-03 LAB — COMPREHENSIVE METABOLIC PANEL
ALBUMIN: 3.8 g/dL (ref 3.5–5.2)
ALK PHOS: 73 U/L (ref 39–117)
ALT: 15 U/L (ref 0–53)
AST: 16 U/L (ref 0–37)
Anion gap: 9 (ref 5–15)
BUN: 15 mg/dL (ref 6–23)
CHLORIDE: 107 meq/L (ref 96–112)
CO2: 26 meq/L (ref 19–32)
Calcium: 9.3 mg/dL (ref 8.4–10.5)
Creatinine, Ser: 1.05 mg/dL (ref 0.50–1.35)
GFR calc Af Amer: 75 mL/min — ABNORMAL LOW (ref >90)
GFR, EST NON AFRICAN AMERICAN: 65 mL/min — AB (ref >90)
GLUCOSE: 138 mg/dL — AB (ref 70–99)
POTASSIUM: 4.8 meq/L (ref 3.7–5.3)
Sodium: 142 mEq/L (ref 137–147)
Total Bilirubin: 0.8 mg/dL (ref 0.3–1.2)
Total Protein: 7.2 g/dL (ref 6.0–8.3)

## 2014-07-03 LAB — CBC WITH DIFFERENTIAL/PLATELET
BASOS PCT: 0 % (ref 0–1)
Basophils Absolute: 0 10*3/uL (ref 0.0–0.1)
Eosinophils Absolute: 0.1 10*3/uL (ref 0.0–0.7)
Eosinophils Relative: 1 % (ref 0–5)
HCT: 38.7 % — ABNORMAL LOW (ref 39.0–52.0)
HEMOGLOBIN: 13.1 g/dL (ref 13.0–17.0)
LYMPHS ABS: 0.8 10*3/uL (ref 0.7–4.0)
LYMPHS PCT: 6 % — AB (ref 12–46)
MCH: 30.6 pg (ref 26.0–34.0)
MCHC: 33.9 g/dL (ref 30.0–36.0)
MCV: 90.4 fL (ref 78.0–100.0)
MONO ABS: 0.6 10*3/uL (ref 0.1–1.0)
Monocytes Relative: 5 % (ref 3–12)
NEUTROS ABS: 11.4 10*3/uL — AB (ref 1.7–7.7)
NEUTROS PCT: 88 % — AB (ref 43–77)
Platelets: 281 10*3/uL (ref 150–400)
RBC: 4.28 MIL/uL (ref 4.22–5.81)
RDW: 14 % (ref 11.5–15.5)
WBC: 12.9 10*3/uL — ABNORMAL HIGH (ref 4.0–10.5)

## 2014-07-03 LAB — PROTIME-INR
INR: 1.03 (ref 0.00–1.49)
Prothrombin Time: 13.5 seconds (ref 11.6–15.2)

## 2014-07-03 MED ORDER — SODIUM CHLORIDE 0.45 % IV SOLN
INTRAVENOUS | Status: DC
  Administered 2014-07-03 – 2014-07-07 (×2): via INTRAVENOUS

## 2014-07-03 MED ORDER — ENOXAPARIN SODIUM 40 MG/0.4ML ~~LOC~~ SOLN
40.0000 mg | SUBCUTANEOUS | Status: DC
  Administered 2014-07-04: 40 mg via SUBCUTANEOUS
  Filled 2014-07-03 (×2): qty 0.4

## 2014-07-03 MED ORDER — MORPHINE SULFATE 4 MG/ML IJ SOLN
6.0000 mg | Freq: Once | INTRAMUSCULAR | Status: AC
  Administered 2014-07-03: 6 mg via INTRAVENOUS
  Filled 2014-07-03: qty 2

## 2014-07-03 MED ORDER — ENOXAPARIN SODIUM 40 MG/0.4ML ~~LOC~~ SOLN: 40.0000 mg | SUBCUTANEOUS | Status: DC

## 2014-07-03 MED ORDER — MORPHINE SULFATE 2 MG/ML IJ SOLN
2.0000 mg | INTRAMUSCULAR | Status: DC | PRN
  Administered 2014-07-03 – 2014-07-06 (×5): 2 mg via INTRAVENOUS
  Filled 2014-07-03 (×5): qty 1

## 2014-07-03 NOTE — ED Notes (Signed)
Pt to xray at this time.

## 2014-07-03 NOTE — H&P (Signed)
[facility] Admission History and Physical Service Pager: [phone]  Patient name: [name] Medical record number: [id number] Date of birth: [birth year] Age: 80 y.o. Gender: male  Primary Care Provider: Sherrie Mustache, MD Consultants: Ortho Code Status: FULL  Chief Complaint: broken L femur  Assessment and Plan: Eben Choinski is a 80 y.o. male presenting with broken L femur. PMH is significant for prostate cancer 10-15 years ago, glaucoma  Broken L femur: Xray: Oblique displaced proximal femur fracture beginning at lesser trochanter.  No evidence of dislocation or subluxation.  Extremity WWP, no mottling or evidence of compartment syndrome.  Fracture is closed.  Hgb 13.1 (baseline 12-13's). Patient reports no bony pain prior to this event. -Admit to Med-surge under Dr McDiarmid -vitals per floor protocol -[facility] to see in am, likely surgical fixation -repeat CBC in am -Lovenox for DVT prophylaxis given elevated risk of DVT in fracture patient  -PT/OT consult -IV Morphine PRN pain -could benefit from calcium and vitamin D supplementation given fracture in a fairly low risk situation  Bradycardia: notes has not previously been told his heart rate is slow. No prior EKG to review. Likely is baseline heart rate.  -EKG: sinus brady, with borderline L axis deviation -1/2 NS @100cc /hr, HR 40-50's, will bolus if HR stays low. -will continue to monitor   Glaucoma: -Patient uses eye drops daily but is unsure of what he uses. -Monitor -will restart eye drops once they are confirmed  Hx of prostate cancer -pelvic xray shows seeds in prostate -could consider obtaining a repeat PSA given fracture to ensure no recurrence of cancer  FEN/GI:1/2NS@100cc /hr, NPO after midnight Prophylaxis: Lovenox   Disposition: Admit to service under Dr McDiarmid. Discharge pending stabilization of fracture and ortho recs/evaluation.  History of Present Illness:  [name] is a 80 y.o. male presenting with broken L femur.  PMH of glaucoma and prostate cancer treated with seeds.  Patient reports that he was moving a wheelbarrow and fell on ground.  Patient didn't think anything was wrong at first but was unable to get up off the ground.  Patient denies extreme pain but does note that his upper leg is uncomfortable.  Pain is about 7-8/10, controlled by Morphine.  Reports that he is completely independent at home (performs ADLs), drives himself and resides alone.  Denies CP, SOB, abdominal pain, or pain anywhere else.  ED: ED provider consulted [facility] ortho.  Ortho provider likely to bring patient in for surgery in the am.  NPO after midnight. HR 40-50's in ED. EKG: sinus bradycardia.  Vital signs were otherwise stable.   Review Of Systems: Per HPI with the following additions: none Otherwise 12 point review of systems was performed and was unremarkable.  Patient Active Problem List   Diagnosis Date Noted  . Closed femur fracture 07/03/2014  . Abdominal  pain, other specified site 10/14/2013  . Nausea & vomiting 10/14/2013   Past Medical History: Past Medical History  Diagnosis Date  . Glaucoma   . Prostate cancer 2004  . Tubular adenoma polyp of rectum 10/08/2013   Past Surgical History: Past Surgical History  Procedure Laterality Date  . Prostate seed implant  2004   Social History: History  Substance Use Topics  . Smoking status: Never Smoker   . Smokeless tobacco: Never Used  . Alcohol Use: Yes     Comment: 1 drink a month   Additional social history: none Please also refer to relevant sections of EMR.  Family History: Family  History  Problem Relation Age of Onset  . Colon cancer Neg Hx    Allergies and Medications: No Known Allergies No current facility-administered medications on file prior to encounter.   No current outpatient prescriptions on file prior to encounter.    Objective: BP 148/74  Pulse 46  Temp(Src)  97.9 F (36.6 C) (Oral)  Resp 20  SpO2 97% Exam: General: well nourished elderly AA male, resting in bed, NAD, family at bedside HEENT: AT/[place], MMM, EOMI, PERRLA Cardiovascular: S1S2, bradycardic but regular rhythm, no murmurs Respiratory: CTAB anteriorly, no wheeze, rhonchi, rales Abdomen: flat, soft, NT/ND, +BS Extremities: WWP, no pitting edema Left BD:[id number] swelling of the upper thigh. Externally rotated and shortened.  Extremity is cool but the same temperature compared to the right.  No mottling. +2 posterior tib pulses b/l R stronger than L. Skin: dry, intact, no lesions/rashes Neuro: alert, oriented, gait not assessed, LE sensation grossly intact b/l  Labs and Imaging: CBC BMET   Recent Labs Lab 07/03/14 2147  WBC 12.9*  HGB 13.1  HCT 38.7*  PLT 281   No results found for this basename: NA, K, CL, CO2, BUN, CREATININE, GLUCOSE, CALCIUM,  in the last 168 hours   Dg Pelvis 1-2 Views  07/03/2014   CLINICAL DATA:  Fall.  Proximal femur pain.  EXAM: PELVIS - 1-2 VIEW  COMPARISON:  None.  FINDINGS: An oblique fracture of the proximal femur begins at the lesser trochanter and extends inferiorly and laterally. The hip remains located. Degenerative changes are noted in the SI joints. The pelvis is intact. Prostate seeds are noted.  IMPRESSION: 1. Oblique proximal femur fracture beginning at the lesser trochanter.   Electronically Signed   By: Lawrence Santiago M.D.   On: 07/03/2014 21:11   Dg Femur Left  07/03/2014   CLINICAL DATA:  80 year old male right leg pain following fall.  EXAM: LEFT FEMUR - 2 VIEW  COMPARISON:  None.  FINDINGS: An oblique/spiral type fracture of the proximal left femoral diaphysis is noted with 3.5 cm posterior and medial displacement.  There is no evidence of subluxation or dislocation.  No other acute bony abnormalities are noted.  Prostate seeds are present.  IMPRESSION: Displaced proximal left femoral fracture.   Electronically Signed   By: Hassan Rowan M.D.    On: 07/03/2014 21:12     Janora Norlander, DO 07/03/2014, 10:05 PM PGY-1, [place] Intern pager: [phone], text pages welcome  Upper Level Addendum:  I have seen and evaluated this patient along with Dr. Lajuana Ripple and reviewed the above note, making necessary revisions in red.   Tommi Rumps, MD Family Medicine PGY-3

## 2014-07-03 NOTE — ED Provider Notes (Signed)
CSN: [id number]     Arrival date & time 07/03/14  2006 History   First MD Initiated Contact with Patient 07/03/14 2007     Chief Complaint  Patient presents with  . Fall     (Consider location/radiation/quality/duration/timing/severity/associated sxs/prior Treatment) Patient is a 80 y.o. male presenting with fall. The history is provided by the patient.  Fall This is a new problem. The current episode started 1 to 2 hours ago. The problem occurs constantly. The problem has been resolved. Pertinent negatives include no chest pain, no abdominal pain and no shortness of breath. Nothing aggravates the symptoms. Nothing relieves the symptoms.    Past Medical History  Diagnosis Date  . Glaucoma   . Prostate cancer 2004  . Tubular adenoma polyp of rectum 10/08/2013   Past Surgical History  Procedure Laterality Date  . Prostate seed implant  2004   Family History  Problem Relation Age of Onset  . Colon cancer Neg Hx    History  Substance Use Topics  . Smoking status: Never Smoker   . Smokeless tobacco: Never Used  . Alcohol Use: Yes     Comment: 1 drink a month    Review of Systems  Constitutional: Negative for fever and chills.  Respiratory: Negative for cough and shortness of breath.   Cardiovascular: Negative for chest pain and leg swelling.  Gastrointestinal: Negative for vomiting and abdominal pain.  All other systems reviewed and are negative.     Allergies  Review of patient's allergies indicates no known allergies.  Home Medications   Prior to Admission medications   Not on File   BP 152/80  Pulse 46  Temp(Src) 97.9 F (36.6 C) (Oral)  Resp 17  SpO2 97% Physical Exam  Nursing note and vitals reviewed. Constitutional: He is oriented to person, place, and time. He appears well-developed and well-nourished. No distress.  HENT:  Head: Normocephalic and atraumatic.  Mouth/Throat: No oropharyngeal exudate.  Eyes: EOM are normal. Pupils are equal, round,  and reactive to light.  Neck: Normal range of motion. Neck supple.  Cardiovascular: Normal rate and regular rhythm.  Exam reveals no friction rub.   No murmur heard. Pulmonary/Chest: Effort normal and breath sounds normal. No respiratory distress. He has no wheezes. He has no rales.  Abdominal: He exhibits no distension. There is no tenderness. There is no rebound.  Musculoskeletal: He exhibits no edema.       Left hip: He exhibits decreased range of motion, decreased strength, tenderness and bony tenderness.  L leg externally rotated and shortened  Neurological: He is alert and oriented to person, place, and time.  Skin: He is not diaphoretic.    ED Course  Procedures (including critical care time) Labs Review Labs Reviewed  CBC WITH DIFFERENTIAL - Abnormal; Notable for the following:    WBC 12.9 (*)    HCT 38.7 (*)    Neutrophils Relative % 88 (*)    Neutro Abs 11.4 (*)    Lymphocytes Relative 6 (*)    All other components within normal limits  COMPREHENSIVE METABOLIC PANEL - Abnormal; Notable for the following:    Glucose, Bld 138 (*)    GFR calc non Af Amer 65 (*)    GFR calc Af Amer 75 (*)    All other components within normal limits  PROTIME-INR    Imaging Review Dg Pelvis 1-2 Views  07/03/2014   CLINICAL DATA:  Fall.  Proximal femur pain.  EXAM: PELVIS - 1-2 VIEW  COMPARISON:  None.  FINDINGS: An oblique fracture of the proximal femur begins at the lesser trochanter and extends inferiorly and laterally. The hip remains located. Degenerative changes are noted in the SI joints. The pelvis is intact. Prostate seeds are noted.  IMPRESSION: 1. Oblique proximal femur fracture beginning at the lesser trochanter.   Electronically Signed   By: Lawrence Santiago M.D.   On: 07/03/2014 21:11   Dg Femur Left  07/03/2014   CLINICAL DATA:  80 year old male right leg pain following fall.  EXAM: LEFT FEMUR - 2 VIEW  COMPARISON:  None.  FINDINGS: An oblique/spiral type fracture of the proximal  left femoral diaphysis is noted with 3.5 cm posterior and medial displacement.  There is no evidence of subluxation or dislocation.  No other acute bony abnormalities are noted.  Prostate seeds are present.  IMPRESSION: Displaced proximal left femoral fracture.   Electronically Signed   By: Hassan Rowan M.D.   On: 07/03/2014 21:12     EKG Interpretation None      MDM   Final diagnoses:  Closed femur fracture, left, initial encounter    10M s/p fall - fell while pushing a wheel barrow. L leg shortened and externally rotated. Mild pain. No loss of consciousness. Concern for hip fracture. L femur with oblique proximal femur fx at lesser trochanter. NVI. Dr. Erlinda Hong with Ortho with see him in the morning. Admitted by medicine.   Evelina Bucy, MD 07/03/14 [phone]

## 2014-07-03 NOTE — ED Notes (Signed)
Pt arrives via EMS from home with c/o left hip pain post fall. Shortened, externally rotated. CMS intact to LLE. Per EMS, he was outside pushing a wheelbarrow and lost his balance. Initially rated pain 8/10, 4 MG morphine given by EMS, rates pain a 4/10 at this time. VSS. No hx, no allergies, no meds. 22 G IV R wrist

## 2014-07-04 DIAGNOSIS — S7290XA Unspecified fracture of unspecified femur, initial encounter for closed fracture: Secondary | ICD-10-CM

## 2014-07-04 LAB — CBC
HEMATOCRIT: 36.2 % — AB (ref 39.0–52.0)
HEMOGLOBIN: 12.1 g/dL — AB (ref 13.0–17.0)
MCH: 31.2 pg (ref 26.0–34.0)
MCHC: 33.4 g/dL (ref 30.0–36.0)
MCV: 93.3 fL (ref 78.0–100.0)
Platelets: 251 10*3/uL (ref 150–400)
RBC: 3.88 MIL/uL — ABNORMAL LOW (ref 4.22–5.81)
RDW: 14.3 % (ref 11.5–15.5)
WBC: 8.2 10*3/uL (ref 4.0–10.5)

## 2014-07-04 LAB — BASIC METABOLIC PANEL
Anion gap: 9 (ref 5–15)
BUN: 14 mg/dL (ref 6–23)
CHLORIDE: 105 meq/L (ref 96–112)
CO2: 25 meq/L (ref 19–32)
Calcium: 9.2 mg/dL (ref 8.4–10.5)
Creatinine, Ser: 0.94 mg/dL (ref 0.50–1.35)
GFR calc Af Amer: 89 mL/min — ABNORMAL LOW (ref >90)
GFR calc non Af Amer: 77 mL/min — ABNORMAL LOW (ref >90)
Glucose, Bld: 127 mg/dL — ABNORMAL HIGH (ref 70–99)
Potassium: 4.8 mEq/L (ref 3.7–5.3)
SODIUM: 139 meq/L (ref 137–147)

## 2014-07-04 LAB — SURGICAL PCR SCREEN
MRSA, PCR: NEGATIVE
STAPHYLOCOCCUS AUREUS: NEGATIVE

## 2014-07-04 MED ORDER — CHLORHEXIDINE GLUCONATE 4 % EX LIQD
60.0000 mL | Freq: Once | CUTANEOUS | Status: DC
  Filled 2014-07-04: qty 60

## 2014-07-04 MED ORDER — ACETAMINOPHEN 325 MG PO TABS
650.0000 mg | ORAL_TABLET | Freq: Four times a day (QID) | ORAL | Status: AC
  Administered 2014-07-04 – 2014-07-06 (×4): 650 mg via ORAL
  Filled 2014-07-04 (×3): qty 2

## 2014-07-04 MED ORDER — SODIUM CHLORIDE 0.9 % IV SOLN
INTRAVENOUS | Status: DC
  Administered 2014-07-05 (×2): via INTRAVENOUS

## 2014-07-04 MED ORDER — CEFAZOLIN SODIUM-DEXTROSE 2-3 GM-% IV SOLR
2.0000 g | INTRAVENOUS | Status: AC
  Administered 2014-07-05 (×2): 2 g via INTRAVENOUS
  Filled 2014-07-04 (×2): qty 50

## 2014-07-04 NOTE — H&P (Signed)
I have seen and examined this patient. I have discussed with Dr Caryl Bis.  I agree with their findings and plans as documented in their admission note.  Acute Issue 1. Acute left femur fracture, traumatic 2. Predisposing factors for post-operative Delirium: Age, Male. Moderate risk.   Plan 1. Anticoagulation perioperatively per orthopedic service. 2. Minimize psychoactive medications and anticholinergic medications 3.  Remove Foley as soon as reasonable post-operatively 4. Adequate analgesia; Recommend scheduled APAP and oxycodone 2.5 mg oral every 6 hours for breakthrough pain once patient able to take oral medications.  If patient needing frequent breakthrough oxycodone, change as needed to scheduled dosing.  5. Monitor electrolytes and hemoglobin  6. Out of bed as soon as orthopedic service gives okay for activity. 7. Family at bedside for reorientation as much as they can.

## 2014-07-04 NOTE — Consult Note (Addendum)
ORTHOPAEDIC CONSULTATION  REQUESTING PHYSICIAN: Blane Ohara McDiarmid, MD  Chief Complaint: left femur fx  HPI: [name] is a 80 y.o. male who complains of left femur fx s/p mechanical fall while trying to use a wheelbarrow.  Denies syncope.  Denies LOC, neck pain, abd pain.  Ortho consulted for proximal femur fx.  Past Medical History  Diagnosis Date  . Glaucoma   . Prostate cancer 2004  . Tubular adenoma polyp of rectum 10/08/2013   Past Surgical History  Procedure Laterality Date  . Prostate seed implant  2004   History   Social History  . Marital Status: Single    Spouse Name: N/A    Number of Children: N/A  . Years of Education: N/A   Social History Main Topics  . Smoking status: Never Smoker   . Smokeless tobacco: Never Used  . Alcohol Use: Yes     Comment: 1 drink a month  . Drug Use: No  . Sexual Activity: None   Other Topics Concern  . None   Social History Narrative  . None   Family History  Problem Relation Age of Onset  . Colon cancer Neg Hx    No Known Allergies Prior to Admission medications   Medication Sig Start Date End Date Taking? Authorizing Provider  PRESCRIPTION MEDICATION Apply 1 drop to eye at bedtime. Glaucoma eye drops   Yes Historical Provider, MD   Dg Pelvis 1-2 Views  07/03/2014   CLINICAL DATA:  Fall.  Proximal femur pain.  EXAM: PELVIS - 1-2 VIEW  COMPARISON:  None.  FINDINGS: An oblique fracture of the proximal femur begins at the lesser trochanter and extends inferiorly and laterally. The hip remains located. Degenerative changes are noted in the SI joints. The pelvis is intact. Prostate seeds are noted.  IMPRESSION: 1. Oblique proximal femur fracture beginning at the lesser trochanter.   Electronically Signed   By: Lawrence Santiago M.D.   On: 07/03/2014 21:11   Dg Femur Left  07/03/2014   CLINICAL DATA:  80 year old male right leg pain following fall.  EXAM: LEFT FEMUR - 2 VIEW  COMPARISON:  None.  FINDINGS: An oblique/spiral  type fracture of the proximal left femoral diaphysis is noted with 3.5 cm posterior and medial displacement.  There is no evidence of subluxation or dislocation.  No other acute bony abnormalities are noted.  Prostate seeds are present.  IMPRESSION: Displaced proximal left femoral fracture.   Electronically Signed   By: Hassan Rowan M.D.   On: 07/03/2014 21:12    Positive ROS: All other systems have been reviewed and were otherwise negative with the exception of those mentioned in the HPI and as above.  Physical Exam: General: Alert, no acute distress Cardiovascular: No pedal edema Respiratory: No cyanosis, no use of accessory musculature GI: No organomegaly, abdomen is soft and non-tender Skin: No lesions in the area of chief complaint Neurologic: Sensation intact distally Psychiatric: Patient is competent for consent with normal mood and affect Lymphatic: No axillary or cervical lymphadenopathy  MUSCULOSKELETAL:  - very painful ROM of hip and thigh - NVI LLE - skin intact   Assessment: Left proximal femur fx  Plan: - recommend IM nail of femur - discussed r/b/a to surgery and patient wishes to proceed - hold periop lovenox - consent signed - will plan for surgery tomorrow - Buck's traction   - Based on history and fracture pattern this likely represents a fragility fracture. - Fragility fractures affect up to one  half of women and one third of men after age 25 years and occur in the setting of bone disorder such as osteoporosis or osteopenia and warrant appropriate work-up. - The following are general recommendations that may serve as an outline for an appropriate work-up:  1.) Obtain bone density measurement to confirm presumptive diagnosis, assess severity of osteoporosis and risk of future fracture, and use as baseline for monitoring treatment  2.) Obtain laboratory tests: CBC, ESR, serum calcium, creatinine, albumin,phosphate, alkaline phosphatase, liver transaminases, protein  electrophoresis, urinalysis, 25-hydroxyvitamin D.  3.) Exclude secondary causes of low bone mass and skeletal fragility (eg,multiple myeloma, lymphoma) as indicated.  4.) Obtain radiograph of thoracic and lumbar spine, particularly among individuals with back pain or height loss to assess presence of vertebral fractures  5.) Intermittent administration of recombinant human parathyroid hormone  6.) Optimize nutritional status using nutritional supplementation.  7.) Patient/family education to prevent future falls.  8.) Early mobilization and exercise program - exercise decreases the rate of bone loss and has been associated with decreased rate of fragility fractures   Thank you for the consult and the opportunity to see [name]  N. Eduard Roux, MD [place] 12:36 PM

## 2014-07-04 NOTE — Progress Notes (Signed)
Family Medicine Teaching Service Daily Progress Note Intern Pager: [phone]  Patient name: [name] Medical record number: [id number] Date of birth: 1932/04/28 Age: 80 y.o. Gender: male  Primary Care Provider: Sherrie Mustache, MD Consultants: Orthopedics Code Status: FULL  Pt Overview and Major Events to Date:  08/30: Bucks traction applied.  No events overnight  Assessment and Plan: [name] is a 80 y.o. male presenting with broken L femur. PMH is significant for prostate cancer 10-15 years ago, glaucoma  Broken L femur: Xray: Oblique displaced proximal femur fracture beginning at lesser trochanter. No evidence of dislocation or subluxation. Extremity WWP, no mottling or evidence of compartment syndrome. Fracture is closed. Hgb 13.1 (baseline 12-13's). Patient reports no bony pain prior to this event.  -LLE with bucks traction -vitals per floor protocol  -Dr Erlinda Hong from [facility] to see this morning, f/u recs -hgb: 12.1 (baseline 12-13's) -Lovenox for DVT prophylaxis given elevated risk of DVT in fracture patient  -PT/OT consult  -IV Morphine PRN pain (last dose 08/29 11:30pm)  -could benefit from calcium and vitamin D supplementation given fracture in a fairly low risk situation   Bradycardia: notes has not previously been told his heart rate is slow. No prior EKG to review. Likely is baseline heart rate.  -EKG: sinus brady, with borderline L axis deviation  -HR 53 this morning, will consider bolus -1/2 NS @100cc /hr, HR 40-50's -will continue to monitor   Glaucoma:  -Patient uses eye drops daily but is unsure of what he uses.  -Monitor  -will restart eye drops once they are confirmed  FEN/GI: NPO until orth evaluation/surgery, 1/2NS @100cc /hr PPx: Lovenox  Disposition: Discharge home after orthopedic intervention and PT evaluation.  Subjective:  Patient reports that he is doing well.  Pain has improved since traction was placed last evening, 4-5/10.  Denies  any changes in sensation of the LE or any pain anywhere else.  Denies CP or SOB.    Objective: Temp:  [97.9 F (36.6 C)-98.1 F (36.7 C)] 98.1 F (36.7 C) (08/30 0501) Pulse Rate:  [46-58] 53 (08/30 0501) Resp:  [16-20] 20 (08/30 0501) BP: (107-155)/(57-87) 107/57 mmHg (08/30 0501) SpO2:  [97 %-99 %] 99 % (08/30 0501) Weight:  [164 lb (74.39 kg)] 164 lb (74.39 kg) (08/29 2300) Physical Exam: General: elderly male asleep in bed, NAD, son at bedside Cardiovascular: S1S2, bradycardic, regular rhythm, no rubs/murmurs Respiratory: CTAB anteriorly, no wheeze/rhonchi/rales, no increased work of breathing Abdomen: flat, soft, NT/ND Extremities: Right LE WWP, Left LE wrapped and in bucks traction, thigh swelling is stable from yesterday (not increased) and is warm to touch, popliteal pulse appreciated. Neuro: sensation in tact on the L thigh, L lower leg unassessable as it is wrapped  Laboratory:  Recent Labs Lab 07/03/14 2147 07/04/14 0507  WBC 12.9* 8.2  HGB 13.1 12.1*  HCT 38.7* 36.2*  PLT 281 251    Recent Labs Lab 07/03/14 2147 07/04/14 0507  NA 142 139  K 4.8 4.8  CL 107 105  CO2 26 25  BUN 15 14  CREATININE 1.05 0.94  CALCIUM 9.3 9.2  PROT 7.2  --   BILITOT 0.8  --   ALKPHOS 73  --   ALT 15  --   AST 16  --   GLUCOSE 138* 127*      Imaging/Diagnostic Tests: Dg Pelvis 1-2 Views  07/03/2014   CLINICAL DATA:  Fall.  Proximal femur pain.  EXAM: PELVIS - 1-2 VIEW  COMPARISON:  None.  FINDINGS:  An oblique fracture of the proximal femur begins at the lesser trochanter and extends inferiorly and laterally. The hip remains located. Degenerative changes are noted in the SI joints. The pelvis is intact. Prostate seeds are noted.  IMPRESSION: 1. Oblique proximal femur fracture beginning at the lesser trochanter.   Electronically Signed   By: Lawrence Santiago M.D.   On: 07/03/2014 21:11   Dg Femur Left  07/03/2014   CLINICAL DATA:  80 year old male right leg pain following  fall.  EXAM: LEFT FEMUR - 2 VIEW  COMPARISON:  None.  FINDINGS: An oblique/spiral type fracture of the proximal left femoral diaphysis is noted with 3.5 cm posterior and medial displacement.  There is no evidence of subluxation or dislocation.  No other acute bony abnormalities are noted.  Prostate seeds are present.  IMPRESSION: Displaced proximal left femoral fracture.   Electronically Signed   By: Hassan Rowan M.D.   On: 07/03/2014 21:12     Janora Norlander, DO 07/04/2014, 7:03 AM PGY-1, [facility] Intern pager: [phone], text pages welcome

## 2014-07-05 ENCOUNTER — Inpatient Hospital Stay (HOSPITAL_COMMUNITY): Payer: Medicare Other

## 2014-07-05 ENCOUNTER — Encounter (HOSPITAL_COMMUNITY)
Admission: EM | Disposition: A | Discharge status: Skilled Nursing Facility | Payer: Self-pay | Source: Home / Self Care | Attending: Family Medicine

## 2014-07-05 ENCOUNTER — Inpatient Hospital Stay (HOSPITAL_COMMUNITY): Payer: Medicare Other | Admitting: Anesthesiology

## 2014-07-05 ENCOUNTER — Encounter (HOSPITAL_COMMUNITY): Payer: Self-pay | Admitting: Certified Registered Nurse Anesthetist

## 2014-07-05 ENCOUNTER — Encounter (HOSPITAL_COMMUNITY): Payer: Medicare Other | Admitting: Anesthesiology

## 2014-07-05 DIAGNOSIS — I498 Other specified cardiac arrhythmias: Secondary | ICD-10-CM

## 2014-07-05 HISTORY — PX: FEMUR IM NAIL: SHX1597

## 2014-07-05 LAB — BASIC METABOLIC PANEL
Anion gap: 11 (ref 5–15)
BUN: 10 mg/dL (ref 6–23)
CHLORIDE: 105 meq/L (ref 96–112)
CO2: 20 meq/L (ref 19–32)
Calcium: 7.9 mg/dL — ABNORMAL LOW (ref 8.4–10.5)
Creatinine, Ser: 0.85 mg/dL (ref 0.50–1.35)
GFR calc Af Amer: >90 mL/min (ref >90)
GFR calc non Af Amer: 80 mL/min — ABNORMAL LOW (ref >90)
Glucose, Bld: 105 mg/dL — ABNORMAL HIGH (ref 70–99)
Potassium: 3.8 mEq/L (ref 3.7–5.3)
SODIUM: 136 meq/L — AB (ref 137–147)

## 2014-07-05 LAB — CBC
HEMATOCRIT: 32.7 % — AB (ref 39.0–52.0)
HEMOGLOBIN: 11.3 g/dL — AB (ref 13.0–17.0)
MCH: 31 pg (ref 26.0–34.0)
MCHC: 34.6 g/dL (ref 30.0–36.0)
MCV: 89.8 fL (ref 78.0–100.0)
Platelets: 227 10*3/uL (ref 150–400)
RBC: 3.64 MIL/uL — AB (ref 4.22–5.81)
RDW: 13.9 % (ref 11.5–15.5)
WBC: 9.9 10*3/uL (ref 4.0–10.5)

## 2014-07-05 LAB — PSA: PSA: 0.02 ng/mL — AB (ref <=4.00)

## 2014-07-05 SURGERY — INSERTION, INTRAMEDULLARY ROD, FEMUR
Anesthesia: General | Site: Leg Upper | Laterality: Left

## 2014-07-05 MED ORDER — MIDAZOLAM HCL 2 MG/2ML IJ SOLN: 1.0000 mg | INTRAMUSCULAR | Status: DC | PRN

## 2014-07-05 MED ORDER — DEXAMETHASONE SODIUM PHOSPHATE 4 MG/ML IJ SOLN
INTRAMUSCULAR | Status: AC
  Filled 2014-07-05: qty 1

## 2014-07-05 MED ORDER — ROCURONIUM BROMIDE 100 MG/10ML IV SOLN
INTRAVENOUS | Status: DC | PRN
  Administered 2014-07-05: 50 mg via INTRAVENOUS
  Administered 2014-07-05: 20 mg via INTRAVENOUS

## 2014-07-05 MED ORDER — ONDANSETRON HCL 4 MG/2ML IJ SOLN: 4.0000 mg | Freq: Four times a day (QID) | INTRAMUSCULAR | Status: DC | PRN

## 2014-07-05 MED ORDER — DEXTROSE 5 % IV SOLN
INTRAVENOUS | Status: DC | PRN
  Administered 2014-07-05: 15:00:00 via INTRAVENOUS

## 2014-07-05 MED ORDER — OXYCODONE HCL 5 MG PO TABS
5.0000 mg | ORAL_TABLET | ORAL | Status: DC | PRN
  Administered 2014-07-06 – 2014-07-07 (×3): 10 mg via ORAL
  Administered 2014-07-07: 5 mg via ORAL
  Filled 2014-07-05: qty 1
  Filled 2014-07-05 (×3): qty 2

## 2014-07-05 MED ORDER — ONDANSETRON HCL 4 MG PO TABS: 4.0000 mg | ORAL_TABLET | Freq: Four times a day (QID) | ORAL | Status: DC | PRN

## 2014-07-05 MED ORDER — MENTHOL 3 MG MT LOZG: 1.0000 | LOZENGE | OROMUCOSAL | Status: DC | PRN

## 2014-07-05 MED ORDER — LIDOCAINE HCL (CARDIAC) 20 MG/ML IV SOLN
INTRAVENOUS | Status: AC
  Filled 2014-07-05: qty 5

## 2014-07-05 MED ORDER — ONDANSETRON HCL 4 MG/2ML IJ SOLN
INTRAMUSCULAR | Status: DC | PRN
  Administered 2014-07-05: 4 mg via INTRAVENOUS

## 2014-07-05 MED ORDER — MAGNESIUM CITRATE PO SOLN: 1.0000 | Freq: Once | ORAL | Status: AC | PRN

## 2014-07-05 MED ORDER — HYDROCODONE-ACETAMINOPHEN 5-325 MG PO TABS: 1.0000 | ORAL_TABLET | Freq: Four times a day (QID) | ORAL | Status: DC | PRN

## 2014-07-05 MED ORDER — ENOXAPARIN SODIUM 40 MG/0.4ML ~~LOC~~ SOLN: 40.0000 mg | Freq: Every day | SUBCUTANEOUS | Status: DC

## 2014-07-05 MED ORDER — ENOXAPARIN SODIUM 40 MG/0.4ML ~~LOC~~ SOLN
40.0000 mg | SUBCUTANEOUS | Status: DC
  Filled 2014-07-05: qty 0.4

## 2014-07-05 MED ORDER — EPHEDRINE SULFATE 50 MG/ML IJ SOLN
INTRAMUSCULAR | Status: DC | PRN
Start: 2014-07-05 — End: 2014-07-05
  Administered 2014-07-05: 15 mg via INTRAVENOUS
  Administered 2014-07-05: 10 mg via INTRAVENOUS

## 2014-07-05 MED ORDER — VECURONIUM BROMIDE 10 MG IV SOLR
INTRAVENOUS | Status: AC
  Filled 2014-07-05: qty 10

## 2014-07-05 MED ORDER — MORPHINE SULFATE 2 MG/ML IJ SOLN: 0.5000 mg | INTRAMUSCULAR | Status: DC | PRN

## 2014-07-05 MED ORDER — CEFAZOLIN SODIUM 1-5 GM-% IV SOLN
1.0000 g | Freq: Four times a day (QID) | INTRAVENOUS | Status: AC
  Administered 2014-07-05 – 2014-07-06 (×3): 1 g via INTRAVENOUS
  Filled 2014-07-05 (×3): qty 50

## 2014-07-05 MED ORDER — SODIUM CHLORIDE 0.9 % IV SOLN
INTRAVENOUS | Status: DC
  Administered 2014-07-05 – 2014-07-06 (×2): via INTRAVENOUS

## 2014-07-05 MED ORDER — METOCLOPRAMIDE HCL 10 MG PO TABS: 5.0000 mg | ORAL_TABLET | Freq: Three times a day (TID) | ORAL | Status: DC | PRN

## 2014-07-05 MED ORDER — ACETAMINOPHEN 325 MG PO TABS: 650.0000 mg | ORAL_TABLET | Freq: Four times a day (QID) | ORAL | Status: DC | PRN

## 2014-07-05 MED ORDER — LACTATED RINGERS IV SOLN
INTRAVENOUS | Status: DC | PRN
  Administered 2014-07-05 (×2): via INTRAVENOUS

## 2014-07-05 MED ORDER — GLYCOPYRROLATE 0.2 MG/ML IJ SOLN
INTRAMUSCULAR | Status: AC
  Filled 2014-07-05: qty 3

## 2014-07-05 MED ORDER — ONDANSETRON HCL 4 MG/2ML IJ SOLN
INTRAMUSCULAR | Status: AC
Start: 2014-07-05 — End: 2014-07-05
  Filled 2014-07-05: qty 2

## 2014-07-05 MED ORDER — NEOSTIGMINE METHYLSULFATE 10 MG/10ML IV SOLN
INTRAVENOUS | Status: AC
  Filled 2014-07-05: qty 1

## 2014-07-05 MED ORDER — DOCUSATE SODIUM 100 MG PO CAPS: 100.0000 mg | ORAL_CAPSULE | Freq: Two times a day (BID) | ORAL | Status: DC | PRN

## 2014-07-05 MED ORDER — WHITE PETROLATUM GEL
Status: AC
  Filled 2014-07-05: qty 5

## 2014-07-05 MED ORDER — STERILE WATER FOR INJECTION IJ SOLN
INTRAMUSCULAR | Status: AC
  Filled 2014-07-05: qty 10

## 2014-07-05 MED ORDER — ONDANSETRON HCL 4 MG/2ML IJ SOLN
INTRAMUSCULAR | Status: AC
  Filled 2014-07-05: qty 2

## 2014-07-05 MED ORDER — ROCURONIUM BROMIDE 50 MG/5ML IV SOLN
INTRAVENOUS | Status: AC
  Filled 2014-07-05: qty 1

## 2014-07-05 MED ORDER — ARTIFICIAL TEARS OP OINT
TOPICAL_OINTMENT | OPHTHALMIC | Status: AC
  Filled 2014-07-05: qty 3.5

## 2014-07-05 MED ORDER — MIDAZOLAM HCL 2 MG/2ML IJ SOLN
INTRAMUSCULAR | Status: AC
  Filled 2014-07-05: qty 2

## 2014-07-05 MED ORDER — ACETAMINOPHEN 650 MG RE SUPP: 650.0000 mg | Freq: Four times a day (QID) | RECTAL | Status: DC | PRN

## 2014-07-05 MED ORDER — NEOSTIGMINE METHYLSULFATE 10 MG/10ML IV SOLN
INTRAVENOUS | Status: DC | PRN
  Administered 2014-07-05: 4 mg via INTRAVENOUS

## 2014-07-05 MED ORDER — LIDOCAINE HCL (CARDIAC) 20 MG/ML IV SOLN
INTRAVENOUS | Status: DC | PRN
  Administered 2014-07-05: 100 mg via INTRAVENOUS

## 2014-07-05 MED ORDER — MIDAZOLAM HCL 5 MG/5ML IJ SOLN
INTRAMUSCULAR | Status: DC | PRN
  Administered 2014-07-05: 2 mg via INTRAVENOUS

## 2014-07-05 MED ORDER — FENTANYL CITRATE 0.05 MG/ML IJ SOLN
INTRAMUSCULAR | Status: DC | PRN
  Administered 2014-07-05: 50 ug via INTRAVENOUS
  Administered 2014-07-05: 25 ug via INTRAVENOUS
  Administered 2014-07-05 (×2): 50 ug via INTRAVENOUS
  Administered 2014-07-05: 100 ug via INTRAVENOUS
  Administered 2014-07-05: 50 ug via INTRAVENOUS
  Administered 2014-07-05: 25 ug via INTRAVENOUS

## 2014-07-05 MED ORDER — ALUM & MAG HYDROXIDE-SIMETH 200-200-20 MG/5ML PO SUSP: 30.0000 mL | ORAL | Status: DC | PRN

## 2014-07-05 MED ORDER — METHOCARBAMOL 500 MG PO TABS
500.0000 mg | ORAL_TABLET | Freq: Four times a day (QID) | ORAL | Status: DC | PRN
  Administered 2014-07-05 – 2014-07-07 (×3): 500 mg via ORAL
  Filled 2014-07-05 (×3): qty 1

## 2014-07-05 MED ORDER — FENTANYL CITRATE 0.05 MG/ML IJ SOLN
INTRAMUSCULAR | Status: AC
Start: 2014-07-05 — End: 2014-07-05
  Filled 2014-07-05: qty 5

## 2014-07-05 MED ORDER — DEXAMETHASONE SODIUM PHOSPHATE 4 MG/ML IJ SOLN
INTRAMUSCULAR | Status: DC | PRN
  Administered 2014-07-05: 4 mg via INTRAVENOUS

## 2014-07-05 MED ORDER — METHOCARBAMOL 1000 MG/10ML IJ SOLN: 500.0000 mg | Freq: Four times a day (QID) | INTRAVENOUS | Status: DC | PRN

## 2014-07-05 MED ORDER — SORBITOL 70 % SOLN
30.0000 mL | Freq: Every day | Status: DC | PRN
  Administered 2014-07-07: 30 mL via ORAL
  Filled 2014-07-05: qty 30

## 2014-07-05 MED ORDER — FENTANYL CITRATE 0.05 MG/ML IJ SOLN
INTRAMUSCULAR | Status: AC
  Filled 2014-07-05: qty 5

## 2014-07-05 MED ORDER — 0.9 % SODIUM CHLORIDE (POUR BTL) OPTIME
TOPICAL | Status: DC | PRN
  Administered 2014-07-05: 1000 mL

## 2014-07-05 MED ORDER — PHENOL 1.4 % MT LIQD: 1.0000 | OROMUCOSAL | Status: DC | PRN

## 2014-07-05 MED ORDER — PROPOFOL 10 MG/ML IV BOLUS
INTRAVENOUS | Status: DC | PRN
  Administered 2014-07-05: 110 mg via INTRAVENOUS

## 2014-07-05 MED ORDER — ARTIFICIAL TEARS OP OINT
TOPICAL_OINTMENT | OPHTHALMIC | Status: DC | PRN
  Administered 2014-07-05: 1 via OPHTHALMIC

## 2014-07-05 MED ORDER — SENNA 8.6 MG PO TABS
1.0000 | ORAL_TABLET | Freq: Two times a day (BID) | ORAL | Status: DC
  Administered 2014-07-05 – 2014-07-07 (×4): 8.6 mg via ORAL
  Filled 2014-07-05 (×5): qty 1

## 2014-07-05 MED ORDER — SODIUM CHLORIDE 0.9 % IV SOLN
10.0000 mg | INTRAVENOUS | Status: DC | PRN
  Administered 2014-07-05: 20 ug/min via INTRAVENOUS

## 2014-07-05 MED ORDER — POLYETHYLENE GLYCOL 3350 17 G PO PACK: 17.0000 g | PACK | Freq: Every day | ORAL | Status: DC | PRN

## 2014-07-05 MED ORDER — PROPOFOL 10 MG/ML IV BOLUS
INTRAVENOUS | Status: AC
  Filled 2014-07-05: qty 20

## 2014-07-05 MED ORDER — METOCLOPRAMIDE HCL 5 MG/ML IJ SOLN: 5.0000 mg | Freq: Three times a day (TID) | INTRAMUSCULAR | Status: DC | PRN

## 2014-07-05 MED ORDER — LACTATED RINGERS IV SOLN: INTRAVENOUS | Status: DC

## 2014-07-05 MED ORDER — ENOXAPARIN SODIUM 40 MG/0.4ML ~~LOC~~ SOLN
40.0000 mg | SUBCUTANEOUS | Status: DC
  Administered 2014-07-06 – 2014-07-07 (×2): 40 mg via SUBCUTANEOUS
  Filled 2014-07-05 (×3): qty 0.4

## 2014-07-05 MED ORDER — GLYCOPYRROLATE 0.2 MG/ML IJ SOLN
INTRAMUSCULAR | Status: DC | PRN
  Administered 2014-07-05: .5 mg via INTRAVENOUS

## 2014-07-05 MED ORDER — FENTANYL CITRATE 0.05 MG/ML IJ SOLN: 50.0000 ug | INTRAMUSCULAR | Status: DC | PRN

## 2014-07-05 MED ORDER — NEOSTIGMINE METHYLSULFATE 10 MG/10ML IV SOLN
INTRAVENOUS | Status: AC
  Filled 2014-07-05: qty 3

## 2014-07-05 SURGICAL SUPPLY — 54 items
BIT DRILL LONG 4.0MM (BIT) ×2 IMPLANT
BIT DRILL SHORT 4.0 (BIT) ×1 IMPLANT
BNDG COHESIVE 4X5 TAN STRL (GAUZE/BANDAGES/DRESSINGS) ×12 IMPLANT
COVER PERINEAL POST (MISCELLANEOUS) ×3 IMPLANT
COVER SURGICAL LIGHT HANDLE (MISCELLANEOUS) ×3 IMPLANT
DRAPE C-ARM 42X72 X-RAY (DRAPES) ×3 IMPLANT
DRAPE C-ARMOR (DRAPES) ×3 IMPLANT
DRAPE INCISE IOBAN 66X45 STRL (DRAPES) ×3 IMPLANT
DRAPE ORTHO SPLIT 77X108 STRL (DRAPES) ×4
DRAPE PROXIMA HALF (DRAPES) ×6 IMPLANT
DRAPE SURG ORHT 6 SPLT 77X108 (DRAPES) ×2 IMPLANT
DRAPE U-SHAPE 47X51 STRL (DRAPES) ×3 IMPLANT
DRILL BIT LONG 4.0MM (BIT) ×6
DRILL BIT SHORT 4.0 (BIT) ×2
DRILL STEP  6.4 (BIT) ×3 IMPLANT
DRSG EMULSION OIL 3X3 NADH (GAUZE/BANDAGES/DRESSINGS) ×3 IMPLANT
DRSG MEPILEX BORDER 4X4 (GAUZE/BANDAGES/DRESSINGS) ×6 IMPLANT
DRSG MEPILEX BORDER 4X8 (GAUZE/BANDAGES/DRESSINGS) ×3 IMPLANT
DURAPREP 26ML APPLICATOR (WOUND CARE) ×3 IMPLANT
ELECT CAUTERY BLADE 6.4 (BLADE) ×3 IMPLANT
ELECT REM PT RETURN 9FT ADLT (ELECTROSURGICAL) ×3
ELECTRODE REM PT RTRN 9FT ADLT (ELECTROSURGICAL) ×1 IMPLANT
FACESHIELD WRAPAROUND (MASK) ×6 IMPLANT
GLOVE BIOGEL PI IND STRL 6.5 (GLOVE) ×1 IMPLANT
GLOVE BIOGEL PI INDICATOR 6.5 (GLOVE) ×2
GLOVE ECLIPSE 6.0 STRL STRAW (GLOVE) ×3 IMPLANT
GLOVE SURG SYN 7.5  E (GLOVE) ×4
GLOVE SURG SYN 7.5 E (GLOVE) ×2 IMPLANT
GOWN STRL REIN XL XLG (GOWN DISPOSABLE) ×3 IMPLANT
GOWN STRL REUS W/ TWL LRG LVL3 (GOWN DISPOSABLE) ×3 IMPLANT
GOWN STRL REUS W/TWL LRG LVL3 (GOWN DISPOSABLE) ×6
GUIDE PIN 3.2MM (MISCELLANEOUS) ×2
GUIDE PIN ORTH 343X3.2XBRAD (MISCELLANEOUS) ×1 IMPLANT
GUIDE ROD 3.0 (MISCELLANEOUS) ×3
KIT BASIN OR (CUSTOM PROCEDURE TRAY) ×3 IMPLANT
KIT ROOM TURNOVER OR (KITS) ×3 IMPLANT
LINER BOOT UNIVERSAL DISP (MISCELLANEOUS) ×3 IMPLANT
MANIFOLD NEPTUNE II (INSTRUMENTS) ×3 IMPLANT
NAIL FEMORAL LT 13X44 (Nail) ×3 IMPLANT
NS IRRIG 1000ML POUR BTL (IV SOLUTION) ×3 IMPLANT
PACK GENERAL/GYN (CUSTOM PROCEDURE TRAY) ×3 IMPLANT
PAD ARMBOARD 7.5X6 YLW CONV (MISCELLANEOUS) ×6 IMPLANT
ROD GUIDE 3.0 (MISCELLANEOUS) ×1 IMPLANT
SCREW 6.4X100 (Screw) ×6 IMPLANT
SCREW 6.4X110 (Screw) ×3 IMPLANT
SCREW TRIGEN LOW PROF 5.0X42.5 (Screw) ×3 IMPLANT
SCREW TRIGEN LOW PROF 5.0X60 (Screw) ×3 IMPLANT
STAPLER VISISTAT 35W (STAPLE) IMPLANT
STOCKINETTE IMPERVIOUS 9X36 MD (GAUZE/BANDAGES/DRESSINGS) ×3 IMPLANT
SUT VIC AB 0 CT1 27 (SUTURE) ×2
SUT VIC AB 0 CT1 27XBRD ANBCTR (SUTURE) ×1 IMPLANT
SUT VIC AB 2-0 CT1 27 (SUTURE) ×4
SUT VIC AB 2-0 CT1 TAPERPNT 27 (SUTURE) ×2 IMPLANT
WATER STERILE IRR 1000ML POUR (IV SOLUTION) ×6 IMPLANT

## 2014-07-05 NOTE — Anesthesia Preprocedure Evaluation (Addendum)
Anesthesia Evaluation   Patient awake    Reviewed: Allergy & Precautions, H&P , NPO status , Patient's Chart, lab work & pertinent test results  Airway Mallampati: I TM Distance: >3 FB Neck ROM: Full    Dental  (+) Dental Advisory Given, Teeth Intact   Pulmonary          Cardiovascular     Neuro/Psych    GI/Hepatic   Endo/Other    Renal/GU      Musculoskeletal   Abdominal   Peds  Hematology   Anesthesia Other Findings   Reproductive/Obstetrics                         Anesthesia Physical Anesthesia Plan  ASA: II  Anesthesia Plan: General   Post-op Pain Management:    Induction: Intravenous  Airway Management Planned: Oral ETT  Additional Equipment:   Intra-op Plan:   Post-operative Plan: Extubation in OR  Informed Consent: I have reviewed the patients History and Physical, chart, labs and discussed the procedure including the risks, benefits and alternatives for the proposed anesthesia with the patient or authorized representative who has indicated his/her understanding and acceptance.   Dental advisory given  Plan Discussed with: CRNA and Surgeon  Anesthesia Plan Comments:        Anesthesia Quick Evaluation

## 2014-07-05 NOTE — Anesthesia Procedure Notes (Signed)
Procedure Name: Intubation Date/Time: 07/05/2014 3:17 PM Performed by: Jacquiline Doe A Pre-anesthesia Checklist: Patient identified, Timeout performed, Emergency Drugs available, Suction available and Patient being monitored Patient Re-evaluated:Patient Re-evaluated prior to inductionOxygen Delivery Method: Circle system utilized Preoxygenation: Pre-oxygenation with 100% oxygen Intubation Type: IV induction and Cricoid Pressure applied Ventilation: Mask ventilation without difficulty Laryngoscope Size: Miller and 2 Grade View: Grade I Tube type: Oral Tube size: 7.5 mm Number of attempts: 1 Airway Equipment and Method: Stylet Placement Confirmation: ETT inserted through vocal cords under direct vision,  breath sounds checked- equal and bilateral and positive ETCO2 Secured at: 23 cm Tube secured with: Tape Dental Injury: Teeth and Oropharynx as per pre-operative assessment

## 2014-07-05 NOTE — Transfer of Care (Signed)
Immediate Anesthesia Transfer of Care Note  Patient: [name]  Procedure(s) Performed: Procedure(s): INTRAMEDULLARY (IM) NAIL FEMORAL (Left)  Patient Location: PACU  Anesthesia Type:General  Level of Consciousness: oriented, sedated, patient cooperative and responds to stimulation  Airway & Oxygen Therapy: Patient Spontanous Breathing and Patient connected to nasal cannula oxygen  Post-op Assessment: Report given to PACU RN, Post -op Vital signs reviewed and stable, Patient moving all extremities and Patient moving all extremities X 4  Post vital signs: Reviewed and stable  Complications: No apparent anesthesia complications

## 2014-07-05 NOTE — Progress Notes (Signed)
FMTS Attending Note  I personally saw and evaluated the patient. The plan of care was discussed with the resident team. I agree with the assessment and plan as documented by the resident.   80 y/o male admitted with L femur fracture, patient scheduled for OR later today for repair, with history of prostate CA there is concern for possible pathologic fracture, PSA level pending, no evidence of bone lesion on xray, will discuss case with orthopedics and consider additional imaging to evaluate for malignancy  Dossie Arbour MD

## 2014-07-05 NOTE — Progress Notes (Signed)
Family Medicine Teaching Service Daily Progress Note Intern Pager: [phone]  Patient name: [name] Medical record number: [id number] Date of birth: 18-Apr-1932 Age: 80 y.o. Gender: male  Primary Care Provider: Sherrie Mustache, MD Consultants: Orthopedics Code Status: FULL  Pt Overview and Major Events to Date:  08/30: Bucks traction applied.  No events overnight 08/31: Surgery today.  Assessment and Plan: [name] is a 80 y.o. male presenting with broken L femur. PMH is significant for prostate cancer 10-15 years ago, glaucoma  Broken L femur: Xray: Oblique displaced proximal femur fracture beginning at lesser trochanter. No evidence of dislocation or subluxation. Extremity WWP, no mottling or evidence of compartment syndrome. Fracture is closed. Patient reports no bony pain prior to this event.  -LLE with bucks traction -vitals per floor protocol   -Surgery today -hgb: 11.3 (baseline 12-13's). Presently asx. -Lovenox held. -NS @ 125cc/hr pre-op -Vit D level ordered -PSA pending -PT/OT consult  -IV Morphine PRN pain (high pain tolerance, has only been asking for this once daily)  -could benefit from calcium and vitamin D supplementation given fracture in a fairly low risk situation   Bradycardia: notes has not previously been told his heart rate is slow. No prior EKG to review. Likely is baseline heart rate.  -EKG: sinus brady, with borderline L axis deviation  -HR 66 this am -will continue to monitor   Glaucoma:  -Patient uses eye drops daily but is unsure of what he uses.  -Monitor  -will restart eye drops once they are confirmed  FEN/GI: NPO until orth evaluation/surgery, NS @125cc /hr PPx: Lovenox  Disposition: Discharge home with outpatient PT setup, after orthopedic intervention and PT evaluation.  Subjective:  Patient reports that he continues to do well.  States that pain is well controlled.   Denies any changes in sensation of the LE or any pain  anywhere else.  Denies n/v, CP or SOB.  Does note he has not had a BM since admission but denies discomfort.  Admits to good urine output.  Objective: Temp:  [98.4 F (36.9 C)-99.4 F (37.4 C)] 99.4 F (37.4 C) (08/31 0505) Pulse Rate:  [58-69] 58 (08/31 0505) Resp:  [16-20] 16 (08/31 0505) BP: (119-135)/(64-83) 119/64 mmHg (08/31 0505) SpO2:  [98 %-100 %] 99 % (08/31 0505) Physical Exam: General: elderly male resting in bed, NAD, son and phelbotomist at bedside Cardiovascular: S1S2, bradycardic, regular rhythm, no rubs/murmurs Respiratory: CTAB anteriorly, no wheeze/rhonchi/rales, no increased work of breathing Abdomen: flat, soft, NT/ND Extremities: Right LE WWP, Left LE wrapped and in bucks traction @15  lbs, thigh swelling looks slightly increased compared to yesterday and is warm to touch, posterior tibial pulse +1 Neuro: sensation in tact on the L thigh, gait not assessed  Laboratory:  Recent Labs Lab 07/03/14 2147 07/04/14 0507 07/05/14 0650  WBC 12.9* 8.2 9.9  HGB 13.1 12.1* 11.3*  HCT 38.7* 36.2* 32.7*  PLT 281 251 227    Recent Labs Lab 07/03/14 2147 07/04/14 0507  NA 142 139  K 4.8 4.8  CL 107 105  CO2 26 25  BUN 15 14  CREATININE 1.05 0.94  CALCIUM 9.3 9.2  PROT 7.2  --   BILITOT 0.8  --   ALKPHOS 73  --   ALT 15  --   AST 16  --   GLUCOSE 138* 127*      Imaging/Diagnostic Tests: Dg Pelvis 1-2 Views  07/03/2014   CLINICAL DATA:  Fall.  Proximal femur pain.  EXAM: PELVIS - 1-2 VIEW  COMPARISON:  None.  FINDINGS: An oblique fracture of the proximal femur begins at the lesser trochanter and extends inferiorly and laterally. The hip remains located. Degenerative changes are noted in the SI joints. The pelvis is intact. Prostate seeds are noted.  IMPRESSION: 1. Oblique proximal femur fracture beginning at the lesser trochanter.   Electronically Signed   By: Lawrence Santiago M.D.   On: 07/03/2014 21:11   Dg Femur Left  07/03/2014   CLINICAL DATA:   80 year old male right leg pain following fall.  EXAM: LEFT FEMUR - 2 VIEW  COMPARISON:  None.  FINDINGS: An oblique/spiral type fracture of the proximal left femoral diaphysis is noted with 3.5 cm posterior and medial displacement.  There is no evidence of subluxation or dislocation.  No other acute bony abnormalities are noted.  Prostate seeds are present.  IMPRESSION: Displaced proximal left femoral fracture.   Electronically Signed   By: Hassan Rowan M.D.   On: 07/03/2014 21:12     Janora Norlander, DO 07/05/2014, 7:56 AM PGY-1, [place] Intern pager: [phone], text pages welcome

## 2014-07-05 NOTE — Anesthesia Postprocedure Evaluation (Signed)
  Anesthesia Post-op Note  Patient: [name]  Procedure(s) Performed: Procedure(s): INTRAMEDULLARY (IM) NAIL FEMORAL (Left)  Patient Location: PACU  Anesthesia Type:General  Level of Consciousness: awake, alert  and patient cooperative  Airway and Oxygen Therapy: Patient Spontanous Breathing and Patient connected to nasal cannula oxygen  Post-op Pain: none  Post-op Assessment: Post-op Vital signs reviewed, Patient's Cardiovascular Status Stable, Respiratory Function Stable, Patent Airway, No signs of Nausea or vomiting and Pain level controlled  Post-op Vital Signs: Reviewed and stable  Last Vitals:  Filed Vitals:   07/05/14 1900  BP: 127/63  Pulse: 57  Temp:   Resp: 18    Complications: No apparent anesthesia complications

## 2014-07-05 NOTE — H&P (Signed)

## 2014-07-05 NOTE — Progress Notes (Signed)
**  Interim Note**  Spoke with Dr Erlinda Hong on phone with regards to nature of fracture.  He says that looking at the fracture on xray, it does not appear to be pathologic but that a CT scan is merited given patient's h/o Prostate cancer.  Dr Erlinda Hong is ordering CT.  PSA is also pending.  In the event that fracture does prove to be pathologic in nature, he recommends that we plan to transfer patient to [facility] for further treatment.  Appreciate ortho recommendations.   Muriel Hannold M. Lajuana Ripple, DO 07/05/14 10:45am PGY-1, [place]

## 2014-07-05 NOTE — Progress Notes (Signed)
I have seen and examined this patient. I have discussed with Dr Gottschalk.  I agree with their findings and plans as documented in their progress note.     

## 2014-07-05 NOTE — Progress Notes (Signed)
Utilization review completed.  

## 2014-07-05 NOTE — Op Note (Signed)
Date of Surgery: 07/05/2014  INDICATIONS: [name] is a 80 y.o.-year-old male who was involved in a ground level fall and sustained a left hip fracture (subtrochanteric-type). The risks and benefits of the procedure discussed with the patient prior to the procedure and all questions were answered; consent was obtained.  PREOPERATIVE DIAGNOSIS: left hip fracture (subtrochanteric-type)   POSTOPERATIVE DIAGNOSIS: Same   PROCEDURE: Treatment of subtrochanteric fracture with intramedullary implant. CPT [phone]   SURGEON: N. Eduard Roux, M.D.   ANESTHESIA: general   IV FLUIDS AND URINE: See anesthesia record   ESTIMATED BLOOD LOSS: 600 cc  IMPLANTS: [employer] Recon 13 x 44, 100/100 screws  DRAINS: None.   COMPLICATIONS: None.   DESCRIPTION OF PROCEDURE: The patient was brought to the operating room and placed supine on the operating table. The patient's leg had been signed prior to the procedure. The patient had the anesthesia placed by the anesthesiologist. The prep verification and incision time-outs were performed to confirm that this was the correct patient, site, side and location. The patient had an SCD on the opposite lower extremity. The patient did receive antibiotics prior to the incision and was re-dosed during the procedure as needed at indicated intervals. The patient was positioned on the flat jackson table in lateral position on a beanbag. All bony prominences were well-padded. The patient had the lower extremity prepped and draped in the standard surgical fashion. A laterally-based incision over the lateral aspect of the proximal femur was used. Blunt dissection was taken down to the IT band. The IT band was sharply incised in line with the incision. The vastus lateralis was split in line with the incision. Once this was done the fracture was exposed. Any entrapped hematoma or muscle was retrieved out of the fracture site. The fracture was then reduced using traction  rotation and held in place with a clamp. This was confirmed under fluoroscopy. With this reduced we then moved to the intramedullary implant insertion portion of the procedure. A separate incision was made 4 finger breadths superior to the greater trochanter. A guide pin was inserted into the tip of the greater trochanter under fluoroscopic guidance. An opening reamer was used to gain access to the femoral canal. The canal was sequentially reamed until adequate chatter.  The nail length was measured and inserted down the femoral canal to its proper depth.  The appropriate version of insertion for the screws was found under fluoroscopy. The inferior screw was inserted as close to the inferior calcar as possible after drilling and measuring. The superior screw was inserted after drilling and measuring.  With the fracture still redosed we then placed 2 distal interlocking screws using the perfect circle method.  The wound was copiously irrigated with saline and the fascia was closed with 0 Vicryl, the subcutaneous layer closed with 2.0 vicryl and the skin was reapproximated with staples. The wounds were cleaned and dried a final time and a sterile dressing was placed. The hip was taken through a range of motion at the end of the case under fluoroscopic imaging to visualize the approach-withdraw phenomenon and confirm implant length in the head. The patient was then awakened from anesthesia and taken to the recovery room in stable condition. All counts were correct at the end of the case.   POSTOPERATIVE PLAN: The patient will be weight bearing as tolerated and will return in 2 weeks for staple removal and the patient will receive DVT prophylaxis based on other medications, activity level, and  risk ratio of bleeding to thrombosis.  Azucena Cecil, MD [place] 6:28 PM

## 2014-07-06 ENCOUNTER — Encounter (HOSPITAL_COMMUNITY): Payer: Self-pay | Admitting: Orthopaedic Surgery

## 2014-07-06 LAB — CBC
HCT: 26.5 % — ABNORMAL LOW (ref 39.0–52.0)
HEMOGLOBIN: 9.1 g/dL — AB (ref 13.0–17.0)
MCH: 31.9 pg (ref 26.0–34.0)
MCHC: 34.3 g/dL (ref 30.0–36.0)
MCV: 93 fL (ref 78.0–100.0)
Platelets: 203 10*3/uL (ref 150–400)
RBC: 2.85 MIL/uL — AB (ref 4.22–5.81)
RDW: 14.2 % (ref 11.5–15.5)
WBC: 10.5 10*3/uL (ref 4.0–10.5)

## 2014-07-06 LAB — BASIC METABOLIC PANEL
Anion gap: 9 (ref 5–15)
BUN: 8 mg/dL (ref 6–23)
CO2: 23 mEq/L (ref 19–32)
Calcium: 7.9 mg/dL — ABNORMAL LOW (ref 8.4–10.5)
Chloride: 106 mEq/L (ref 96–112)
Creatinine, Ser: 0.84 mg/dL (ref 0.50–1.35)
GFR calc Af Amer: >90 mL/min (ref >90)
GFR, EST NON AFRICAN AMERICAN: 81 mL/min — AB (ref >90)
GLUCOSE: 143 mg/dL — AB (ref 70–99)
POTASSIUM: 4.5 meq/L (ref 3.7–5.3)
Sodium: 138 mEq/L (ref 137–147)

## 2014-07-06 LAB — VITAMIN D 25 HYDROXY (VIT D DEFICIENCY, FRACTURES): Vit D, 25-Hydroxy: 21 ng/mL — ABNORMAL LOW (ref 30–89)

## 2014-07-06 NOTE — Progress Notes (Addendum)
Family Medicine Teaching Service Daily Progress Note Intern Pager: [phone]  Patient name: [name] Medical record number: [id number] Date of birth: [birth year] Age: 80 y.o. Gender: male  Primary Care Provider: Sherrie Mustache, MD Consultants: Orthopedics Code Status: FULL  Pt Overview and Major Events to Date:  08/30: Bucks traction applied.  No events overnight 08/31: Surgery today.  Assessment and Plan: [name] is a 80 y.o. male presenting with broken L femur. PMH is significant for prostate cancer 10-15 years ago, glaucoma  POD#1 s/p Placement of Intramedullary Nail for Broken L femur (07/05/14):  -Initial Xray: Oblique displaced proximal femur fracture beginning at lesser trochanter. No evidence of dislocation or subluxation.  -Vitals per floor protocol   -Hgb: 9.1, down from 11.3 yesterday (baseline 12-13's).   -As expected following surgery  -Continue to monitor CBC -Restart Lovenox today -Vit D= 21 -PSA =0.02 -Abdominal/Pelvic CT- no findings to suggest pathologic injury -PT/OT consult- recommend discharge to ST-SNF -Could benefit from calcium and vitamin D supplementation given fracture in a fairly low risk situation  -Ancef 2g/18mL during surgery -Acetaminophen 650mg  PRN pain -Norco/Vicodin 5-325mg  1-2 tablets q6hr PRN pain -Robaxin 500mg  q6hr PRN muscle spasms -Morphine 2mg  q2hr PRN severe pain -Oxycodone 5-10mg  q4hr PRN breakthrough pain  Bradycardia: -EKG: sinus brady, with borderline L axis deviation  -HR 71 this am with 24hr range 57-71 -Monitor  Glaucoma:  -Patient uses eye drops daily but is unsure of what he uses.  -Monitor  -Restart eye drops once they are confirmed  FEN/GI: Regular Diet, NS @125cc /hr, Maalox/Mylanta 24mL q 4hr PRN indigestion, Miralax 17g PRN constipation, Senna 8.6mg  BID PRN constipation, Colace 100mg  BID PRN constipation, Reglan 5-10mg  q8hr PRN nausea, Zofran 4mg  q6hr PRN nausea PPx: Lovenox 40mg   Disposition:  Discharge home with outpatient PT setup, after orthopedic intervention and PT evaluation.  Subjective:  No complaints overnight. Denies any pain.  Did have muscle relaxer overnight, but did not require any pain medications.  Denies any problems urinating, but has not had bowel movement since hospitalization.  PT was in room and about to evaluate.    Objective: Temp:  [97.7 F (36.5 C)-98.9 F (37.2 C)] 98.9 F (37.2 C) (09/01 0410) Pulse Rate:  [57-71] 71 (09/01 0410) Resp:  [11-20] 20 (09/01 0432) BP: (112-148)/(57-76) 112/57 mmHg (09/01 0410) SpO2:  [95 %-100 %] 100 % (09/01 0432) Physical Exam: General: 80 yo male resting comfortably in bedand in no apparent distress Cardiovascular: S1S2,regular rate and rhythm, no murmurs Respiratory: CTAB anteriorly, no wheeze/rhonchi/rales, no increased work of breathing Abdomen: flat, soft and nondistended, no tenderness or masses to palpation Extremities: Surgical site observed over left hip and is dry and intact; mild edema noted Neuro: sensation intact on the L thigh  Laboratory:  Recent Labs Lab 07/04/14 0507 07/05/14 0650 07/06/14 0519  WBC 8.2 9.9 10.5  HGB 12.1* 11.3* 9.1*  HCT 36.2* 32.7* 26.5*  PLT 251 227 203    Recent Labs Lab 07/03/14 2147 07/04/14 0507 07/05/14 0650 07/06/14 0519  NA 142 139 136* 138  K 4.8 4.8 3.8 4.5  CL 107 105 105 106  CO2 26 25 20 23   BUN 15 14 10 8   CREATININE 1.05 0.94 0.85 0.84  CALCIUM 9.3 9.2 7.9* 7.9*  PROT 7.2  --   --   --   BILITOT 0.8  --   --   --   ALKPHOS 73  --   --   --   ALT 15  --   --   --  AST 16  --   --   --   GLUCOSE 138* 127* 105* 143*   Imaging/Diagnostic Tests: Dg Pelvis 1-2 Views  07/03/2014   CLINICAL DATA:  Fall.  Proximal femur pain.  EXAM: PELVIS - 1-2 VIEW  COMPARISON:  None.  FINDINGS: An oblique fracture of the proximal femur begins at the lesser trochanter and extends inferiorly and laterally. The hip remains located. Degenerative changes are noted in  the SI joints. The pelvis is intact. Prostate seeds are noted.  IMPRESSION: 1. Oblique proximal femur fracture beginning at the lesser trochanter.   Electronically Signed   By: Lawrence Santiago M.D.   On: 07/03/2014 21:11   Dg Femur Left  07/05/2014   CLINICAL DATA:  ORIF of a left proximal femur fracture.  EXAM: LEFT FEMUR - 2 VIEW; DG C-ARM 61-120 MIN  COMPARISON:  07/05/2014 at 1233 hr  FINDINGS: Submitted portable images show placement of 2 screws supported by a long intra medullary rod reducing the major intertrochanteric fracture fragments into near anatomic alignment. There is no new fracture or evidence of an operative complication.  IMPRESSION: ORIF of a proximal left femur fracture as described.   Electronically Signed   By: Lajean Manes M.D.   On: 07/05/2014 18:00   Dg Femur Left  07/03/2014   CLINICAL DATA:  80 year old male right leg pain following fall.  EXAM: LEFT FEMUR - 2 VIEW  COMPARISON:  None.  FINDINGS: An oblique/spiral type fracture of the proximal left femoral diaphysis is noted with 3.5 cm posterior and medial displacement.  There is no evidence of subluxation or dislocation.  No other acute bony abnormalities are noted.  Prostate seeds are present.  IMPRESSION: Displaced proximal left femoral fracture.   Electronically Signed   By: Hassan Rowan M.D.   On: 07/03/2014 21:12   Ct Hip Left Wo Contrast  07/05/2014   CLINICAL DATA:  Status post fall of the left femur fracture. History of prostate cancer. Question pathologic fracture.  EXAM: CT OF THE LEFT HIP WITHOUT CONTRAST  TECHNIQUE: Multidetector CT imaging was performed according to the standard protocol. Multiplanar CT image reconstructions were also generated.  COMPARISON:  Plain films left femur 07/03/2014.  FINDINGS: As seen on the patient's plain films, there is a proximal left femur fracture. No finding to suggest pathologic fracture is present. No focal bony lesion is seen. The fracture extends from the lesser trochanter in an  inferior in an oblique orientation through the lateral cortex of femur approximately 11 cm below the top of the greater trochanter. The distal fragment demonstrates approximately 1/2 shaft width medial displacement. Fragments are distracted up to 2.6 cm. Contusion and hematoma about the patient's fracture are noted. Imaged intrapelvic contents demonstrate radiotherapy seeds in the prostate gland.  IMPRESSION: Proximal left femur fracture has no finding to suggest pathologic injury.   Electronically Signed   By: Inge Rise M.D.   On: 07/05/2014 13:23   Dg C-arm 61-120 Min  07/05/2014   CLINICAL DATA:  ORIF of a left proximal femur fracture.  EXAM: LEFT FEMUR - 2 VIEW; DG C-ARM 61-120 MIN  COMPARISON:  07/05/2014 at 1233 hr  FINDINGS: Submitted portable images show placement of 2 screws supported by a long intra medullary rod reducing the major intertrochanteric fracture fragments into near anatomic alignment. There is no new fracture or evidence of an operative complication.  IMPRESSION: ORIF of a proximal left femur fracture as described.   Electronically Signed   By: Shanon Brow  Ormond M.D.   On: 07/05/2014 18:00     Lorna Few, DO 07/06/2014, 9:46 AM PGY-1, [place] Intern pager: [phone], text pages welcome

## 2014-07-06 NOTE — Progress Notes (Signed)
FMTS Attending Note  I personally saw and evaluated the patient. The plan of care was discussed with the resident team. I agree with the assessment and plan as documented by the resident.   Pain is controlled, able to stand today with PT  Dossie Arbour

## 2014-07-06 NOTE — Progress Notes (Signed)
   Subjective:  Patient reports pain as moderate.  No events.  Objective:   VITALS:   Filed Vitals:   07/06/14 0410 07/06/14 0432 07/06/14 0800 07/06/14 1154  BP: 112/57     Pulse: 71     Temp: 98.9 F (37.2 C)     TempSrc: Oral     Resp: 20 20 18 18   Height:      Weight:      SpO2: 100% 100%      Neurologically intact Neurovascular intact Sensation intact distally Intact pulses distally Dorsiflexion/Plantar flexion intact Incision: dressing C/D/I and no drainage No cellulitis present Compartment soft   Lab Results  Component Value Date   WBC 10.5 07/06/2014   HGB 9.1* 07/06/2014   HCT 26.5* 07/06/2014   MCV 93.0 07/06/2014   PLT 203 07/06/2014     Assessment/Plan:  1 Day Post-Op   - Expected postop acute blood loss anemia - will monitor for symptoms - Up with PT/OT - DVT ppx - SCDs, ambulation, lovenox - 50% PWB left lower extremity - Pain control - Discharge planning - needs SNF  Marianna Payment 07/06/2014, 11:56 AM [phone]

## 2014-07-06 NOTE — Evaluation (Signed)
Physical Therapy Evaluation Patient Details Name: [name] MRN: [id number] DOB: February 16, 1932 Today's Date: 07/06/2014   History of Present Illness  pt presents with L Femur fx post IM Nail.    Clinical Impression  Pt very motivated to work on mobility and attempts to ambulate today, but unable 2/2 weakness and buckling in L LE.  Per son family is unable to provide the 24hr care pt would need initially, so will need to consider ST-SNF.  Pt and family do want to check with SNF to make sure pt could get a pass to leave for part of the day Saturday for his Family Reunion.  Will continue to follow while on acute.      Follow Up Recommendations SNF    Equipment Recommendations  Rolling walker with 5" wheels;3in1 (PT)    Recommendations for Other Services       Precautions / Restrictions Precautions Precautions: Fall Restrictions Weight Bearing Restrictions: Yes LLE Weight Bearing: Partial weight bearing LLE Partial Weight Bearing Percentage or Pounds: 50%      Mobility  Bed Mobility Overal bed mobility: Needs Assistance Bed Mobility: Supine to Sit     Supine to sit: Mod assist     General bed mobility comments: A with L LE and bringing trunk up to sitting.    Transfers Overall transfer level: Needs assistance Equipment used: Rolling walker (2 wheeled) Transfers: Sit to/from [facility] Sit to Stand: Mod assist;+2 physical assistance Stand pivot transfers: Mod assist       General transfer comment: cues for UE use and safe technique.  pt attempted to ambulate, but unable 2/2 pain and weakness in L LE.  L LE buckles.  cueing for use of UEs on RW, but pt with difficulty following through.    Ambulation/Gait                Stairs            Wheelchair Mobility    Modified Rankin (Stroke Patients Only)       Balance Overall balance assessment: History of Falls;Needs assistance Sitting-balance support: Single extremity supported;Feet  supported Sitting balance-Leahy Scale: Poor     Standing balance support: Bilateral upper extremity supported Standing balance-Leahy Scale: Poor                               Pertinent Vitals/Pain Pain Assessment: Faces Faces Pain Scale: Hurts little more (Asked pt to rate, but doesn't answer.) Pain Location: L hip/ thigh Pain Intervention(s): Premedicated before session;Repositioned    Home Living Family/patient expects to be discharged to:: Skilled nursing facility                      Prior Function Level of Independence: Independent               Hand Dominance        Extremity/Trunk Assessment   Upper Extremity Assessment: Defer to OT evaluation           Lower Extremity Assessment: LLE deficits/detail   LLE Deficits / Details: pt weak post-op.    Cervical / Trunk Assessment: Kyphotic  Communication   Communication: No difficulties  Cognition Arousal/Alertness: Awake/alert Behavior During Therapy: WFL for tasks assessed/performed Overall Cognitive Status: History of cognitive impairments - at baseline                      General Comments  Exercises        Assessment/Plan    PT Assessment Patient needs continued PT services  PT Diagnosis Difficulty walking;Acute pain   PT Problem List Decreased strength;Decreased activity tolerance;Decreased balance;Decreased mobility;Decreased knowledge of use of DME;Pain  PT Treatment Interventions DME instruction;Gait training;Stair training;Functional mobility training;Therapeutic activities;Therapeutic exercise;Balance training;Patient/family education   PT Goals (Current goals can be found in the Care Plan section) Acute Rehab PT Goals Patient Stated Goal: To go to his Family Reunion this Saturday. PT Goal Formulation: With patient/family Time For Goal Achievement: 07/20/14 Potential to Achieve Goals: Good    Frequency Min 3X/week   Barriers to discharge         Co-evaluation               End of Session Equipment Utilized During Treatment: Gait belt Activity Tolerance: Patient limited by pain;Patient limited by fatigue Patient left: in chair;with call bell/phone within reach;with family/visitor present Nurse Communication: Mobility status         Time: 6122-4497 PT Time Calculation (min): 37 min   Charges:   PT Evaluation $Initial PT Evaluation Tier I: 1 Procedure PT Treatments $Therapeutic Activity: 23-37 mins   PT G CodesCatarina Hartshorn, [place] [phone] 07/06/2014, 9:59 AM

## 2014-07-07 ENCOUNTER — Other Ambulatory Visit (HOSPITAL_COMMUNITY): Payer: Self-pay | Admitting: Pharmacy Technician

## 2014-07-07 LAB — CBC
HCT: 22.7 % — ABNORMAL LOW (ref 39.0–52.0)
Hemoglobin: 7.9 g/dL — ABNORMAL LOW (ref 13.0–17.0)
MCH: 31.1 pg (ref 26.0–34.0)
MCHC: 34.8 g/dL (ref 30.0–36.0)
MCV: 89.4 fL (ref 78.0–100.0)
Platelets: 202 10*3/uL (ref 150–400)
RBC: 2.54 MIL/uL — ABNORMAL LOW (ref 4.22–5.81)
RDW: 13.8 % (ref 11.5–15.5)
WBC: 11.8 10*3/uL — ABNORMAL HIGH (ref 4.0–10.5)

## 2014-07-07 LAB — BASIC METABOLIC PANEL
ANION GAP: 9 (ref 5–15)
BUN: 8 mg/dL (ref 6–23)
CHLORIDE: 107 meq/L (ref 96–112)
CO2: 22 mEq/L (ref 19–32)
Calcium: 7.8 mg/dL — ABNORMAL LOW (ref 8.4–10.5)
Creatinine, Ser: 0.8 mg/dL (ref 0.50–1.35)
GFR, EST NON AFRICAN AMERICAN: 82 mL/min — AB (ref >90)
Glucose, Bld: 123 mg/dL — ABNORMAL HIGH (ref 70–99)
POTASSIUM: 3.9 meq/L (ref 3.7–5.3)
SODIUM: 138 meq/L (ref 137–147)

## 2014-07-07 MED ORDER — POLYETHYLENE GLYCOL 3350 17 G PO PACK: 17.0000 g | PACK | Freq: Every day | ORAL | Status: DC | PRN

## 2014-07-07 MED ORDER — ACETAMINOPHEN 325 MG PO TABS: 650.0000 mg | ORAL_TABLET | Freq: Four times a day (QID) | ORAL | Status: DC | PRN

## 2014-07-07 MED ORDER — CALCIUM CARB-CHOLECALCIFEROL 600-800 MG-UNIT PO TABS: 1.0000 | ORAL_TABLET | Freq: Every day | ORAL | Status: DC

## 2014-07-07 NOTE — Progress Notes (Signed)
Patient discharged to [facility] via [facility] x 2 staff. Dressings changed x 3 Mepilex and medicated for pain for the ride to [place]. Multiple attempts to call report and no answer. No s/sx of acute distress noted.

## 2014-07-07 NOTE — Discharge Summary (Signed)
[facility] Discharge Summary  Patient name: [name] Medical record number: [id number] Date of birth: 02/13/1932 Age: 80 y.o. Gender: male Date of Admission: 07/03/2014  Date of Discharge: 07/07/14 Admitting Physician: Blane Ohara McDiarmid, MD  Primary Care Provider: Sherrie Mustache, MD Consultants: Orthopedics  Indication for Hospitalization: Closed Left Femur Fracture  Discharge Diagnoses/Problem List:  Closed Left Femur Fracture Placement of Intramedullary Nail in Left Femur Bradycardia Glaucoma  Disposition: Discharge to [facility] SNF  Discharge Condition: Stable  Brief Hospital Course:  #Closed Left Femur Fracture -Xray- oblique displaced proximal femur fracture beginning at lesser trochanter.  No evidence of dislocation or subluxation -Abdominal/Pelvic CT- no findings to suggest pathologic injury -Placement of Intramedullary Nail in Left Femur on 07/05/14  -Ancef 2g/72mL given during surgery  -POD#2 on Day of Discharge -Pain Management:  AcetaminophenPRN, Norco/Vicodin PRN, Robaxin PRN, MorphinePRN, Oxycodone PRN offered  -Mr. Skeels used Acetaminophen, Robaxin, and Oxycodone throughout -Hemoglobin decreased from 13.1 to 7.9 secondary to surgery -Vitamin D level low at 21 and Ca low at 7.8  (trended down from 9.3 at admission)  -Discharged on Calcium Carbonate-Vitamin D supplementation -PSA- 0.02 -Orthopedics and PT/OT recommend discharge to SNF  #Bradycardia -EKG- sinus bradycardia with borderline left axis deviation -HR range 64-89 during hospitalization and was 89 at time of discharge  Issues for Follow Up:  -Vitamin D and Calcium low and discharged with supplementation.  Consider DEXA scan on follow up. -Follow-up with Orthopedics to remove staples in 2 weeks.  Significant Procedures: Intramedullary Nail Placement in Left Femur on 07/05/14  Significant Labs and Imaging:   Recent Labs Lab 07/05/14 0650 07/06/14 0519  07/07/14 0615  WBC 9.9 10.5 11.8*  HGB 11.3* 9.1* 7.9*  HCT 32.7* 26.5* 22.7*  PLT 227 203 202    Recent Labs Lab 07/03/14 2147 07/04/14 0507 07/05/14 0650 07/06/14 0519 07/07/14 0615  NA 142 139 136* 138 138  K 4.8 4.8 3.8 4.5 3.9  CL 107 105 105 106 107  CO2 26 25 20 23 22   GLUCOSE 138* 127* 105* 143* 123*  BUN 15 14 10 8 8   CREATININE 1.05 0.94 0.85 0.84 0.80  CALCIUM 9.3 9.2 7.9* 7.9* 7.8*  ALKPHOS 73  --   --   --   --   AST 16  --   --   --   --   ALT 15  --   --   --   --   ALBUMIN 3.8  --   --   --   --    PSA- 0.02 Vitamin D- 21 -Xray- oblique displaced proximal femur fracture beginning at lesser trochanter.  No evidence of dislocation or subluxation -Abdominal/Pelvic CT- no findings to suggest pathologic injury   Results/Tests Pending at Time of Discharge: None  Discharge Medications:    Medication List         acetaminophen 325 MG tablet  Commonly known as:  TYLENOL  Take 2 tablets (650 mg total) by mouth every 6 (six) hours as needed for mild pain (or Fever >/= 101).     enoxaparin 40 MG/0.4ML injection  Commonly known as:  LOVENOX  Inject 0.4 mLs (40 mg total) into the skin daily.     HYDROcodone-acetaminophen 5-325 MG per tablet  Commonly known as:  NORCO  Take 1-2 tablets by mouth every 6 (six) hours as needed.     polyethylene glycol packet  Commonly known as:  MIRALAX / GLYCOLAX  Take 17 g by mouth  daily as needed for mild constipation.     PRESCRIPTION MEDICATION  Apply 1 drop to eye at bedtime. Glaucoma eye drops       Discharge Instructions: Please refer to Patient Instructions section of EMR for full details.  Patient was counseled important signs and symptoms that should prompt return to medical care, changes in medications, dietary instructions, activity restrictions, and follow up appointments.   Follow-Up Appointments:     Follow-up Information   Follow up with Marianna Payment, MD In 2 weeks. (For suture removal, For  wound re-check)    Specialty:  Orthopedic Surgery   Contact information:   [place] [id number] [place], DO 07/07/2014, 2:23 PM PGY-1, [place]

## 2014-07-07 NOTE — Discharge Instructions (Signed)
1. Change dressings as needed 2. Take lovenox to prevent blood clots 3. Take pain meds and stool softeners as needed

## 2014-07-07 NOTE — Clinical Social Work Note (Signed)
Pt chooses bed at [facility] for STR.  Pt will be transferred via [facility] once medically stable.  MD made aware of bed availability.  Nonnie Done, [place] [phone]  Psychiatric & Orthopedics (5N 1-16) Clinical Social Worker

## 2014-07-07 NOTE — Progress Notes (Signed)
   Subjective:  Patient reports pain as moderate.  No events.  Objective:   VITALS:   Filed Vitals:   07/06/14 2108 07/07/14 0000 07/07/14 0400 07/07/14 0513  BP: 128/62   120/57  Pulse: 74   89  Temp: 98.4 F (36.9 C)   99.2 F (37.3 C)  TempSrc:      Resp: 18 18 18 18   Height:      Weight:      SpO2: 100% 100% 100% 98%    Neurologically intact Neurovascular intact Sensation intact distally Intact pulses distally Dorsiflexion/Plantar flexion intact Incision: dressing C/D/I and no drainage No cellulitis present Compartment soft   Lab Results  Component Value Date   WBC 11.8* 07/07/2014   HGB 7.9* 07/07/2014   HCT 22.7* 07/07/2014   MCV 89.4 07/07/2014   PLT 202 07/07/2014     Assessment/Plan:  2 Days Post-Op   - Expected postop acute blood loss anemia - will monitor for symptoms - Up with PT/OT - DVT ppx - SCDs, ambulation, lovenox - 50% PWB left lower extremity - Pain control - Discharge planning - needs SNF - dressing changed  Marianna Payment 07/07/2014, 8:03 AM [phone]

## 2014-07-07 NOTE — Clinical Social Work Placement (Signed)
Clinical Social Work Department CLINICAL SOCIAL WORK PLACEMENT NOTE 07/07/2014  Patient:  Seth,Omarie  Account Number:  [id number] Admit date:  07/03/2014  Clinical Social Worker:  Wylene Men  Date/time:  07/07/2014 11:32 AM  Clinical Social Work is seeking post-discharge placement for this patient at the following level of care:   [place]   (*CSW will update this form in Epic as items are completed)   07/07/2014  Patient/family provided with [facility] Department of Clinical Social Work's list of facilities offering this level of care within the geographic area requested by the patient (or if unable, by the patient's family).  07/07/2014  Patient/family informed of their freedom to choose among providers that offer the needed level of care, that participate in Medicare, Medicaid or managed care program needed by the patient, have an available bed and are willing to accept the patient.  07/07/2014  Patient/family informed of [facility]' ownership interest in [facility], as well as of the fact that they are under no obligation to receive care at this facility.  PASARR submitted to EDS on 07/07/2014 PASARR number received on 07/07/2014  FL2 transmitted to all facilities in geographic area requested by pt/family on  07/07/2014 FL2 transmitted to all facilities within larger geographic area on   Patient informed that his/her managed care company has contracts with or will negotiate with  certain facilities, including the following:     Patient/family informed of bed offers received:  07/07/2014 Patient chooses bed at [facility] Physician recommends and patient chooses bed at    Patient to be transferred to [facility] on   Patient to be transferred to facility by [facility] Patient and family notified of transfer on  Name of family member notified:    The following physician request were entered in Epic:   Additional  Comments:   Nonnie Done, [place] [phone]  Psychiatric & Orthopedics (5N 1-16) Clinical Social Worker

## 2014-07-07 NOTE — Progress Notes (Signed)
FMTS Attending Note  I personally saw and evaluated the patient. The plan of care was discussed with the resident team. I agree with the assessment and plan as documented by the resident.   Patient able to stand yesterday, still to work with PT today, pain controlled with PRN medications, patient and family on board with discharge to SNF/CIR.  Dossie Arbour MD

## 2014-07-07 NOTE — Care Management Note (Signed)
CARE MANAGEMENT NOTE 07/07/2014  Patient:  [name]   Account Number:  [id number]  Date Initiated:  07/07/2014  Documentation initiated by:  Ricki Miller  Subjective/Objective Assessment:   80 yr old male admitted with left hip fracture, s/p Left hip IM Nailing.     Action/Plan:   Patient has been evaluated by physical therapy, needs SNF. Patient's family would like him evaluated by CIR, CM will follow.   Anticipated DC Date:     Anticipated DC Plan:           Choice offered to / List presented to:             Status of service:  In process, will continue to follow Medicare Important Message given?  YES (If response is "NO", the following Medicare IM given date fields will be blank) Date Medicare IM given:  07/07/2014 Medicare IM given by:  Ricki Miller Date Additional Medicare IM given:   Additional Medicare IM given by:    Discharge Disposition:    Per UR Regulation:  Reviewed for med. necessity/level of care/duration of stay

## 2014-07-07 NOTE — Progress Notes (Signed)
Family Medicine Teaching Service Daily Progress Note Intern Pager: [phone]  Patient name: [name] Medical record number: [id number] Date of birth: 1931-12-20 Age: 80 y.o. Gender: male  Primary Care Provider: Sherrie Mustache, MD Consultants: Orthopedics Code Status: FULL  Pt Overview and Major Events to Date:  8/30: [place] traction applied.  No events overnight 8/31: Surgery today. 9/1:  PT and OT evaluation  Assessment and Plan: [name] is a 80 y.o. male presenting with broken L femur. PMH is significant for prostate cancer 10-15 years ago, glaucoma  POD#2 s/p Placement of Intramedullary Nail for Broken L femur (07/05/14):  -Secondary to falling over wheelbarrow -Labs:  -Hgb: 7.9, down from 9.1 yesterday (baseline 12-13's).    -Continue to monitor CBC  -Vit D= 21  -PSA =0.02  -Ca- 7.8, trending down from 9.3, 9.2, and 7.9 -Imaging:  -Initial Xray: Oblique displaced proximal femur fracture beginning at lesser trochanter. No evidence of dislocation or subluxation.   -Abdominal/Pelvic CT- no findings to suggest pathologic injury -Orthopedics Consult- recommend discharge to SNF -PT/OT consult- recommend discharge to ST-SNF -Could benefit from calcium and vitamin D supplementation given fracture in a fairly low risk situation  -Medications:  -Ancef 2g/65mL during surgery  -Acetaminophen 650mg  PRN pain  -Norco/Vicodin 5-325mg  1-2 tablets q6hr PRN pain  -Robaxin 500mg  q6hr PRN muscle spasms  -Morphine 2mg  q2hr PRN severe pain  -Oxycodone 5-10mg  q4hr PRN breakthrough pain   Bradycardia: -EKG: sinus brady, with borderline L axis deviation  -HR 89 this am with 24hr range 74-89  -Continue to monitor HR  Glaucoma:  -Patient uses eye drops daily but is unsure of what he uses.  -Restart eye drops once they are confirmed  FEN/GI: Regular Diet, NS @125cc /hr, Maalox/Mylanta 56mL q 4hr PRN indigestion, Miralax 17g PRN constipation, Senna 8.6mg  BID PRN constipation, Colace  100mg  BID PRN constipation, Reglan 5-10mg  q8hr PRN nausea, Zofran 4mg  q6hr PRN nausea PPx: Lovenox 40mg , SCDs, Ambulation  Disposition: Discharge home with outpatient PT setup, after orthopedic intervention and PT evaluation.  Subjective:  No acute complaints overnight.  States pain is more significant today and rates it an 8/10 in severity over his left hip.  Has not taken any pain medications today.  Denies problems with appetite or urination.  Has not had bowel movement since being hospitalized. Has been up out of bed and to chair.  Does not remember exactly how the fall took place, but was pushing a wheelbarrow when it occurred.   Objective: Temp:  [98.3 F (36.8 C)-99.2 F (37.3 C)] 99.2 F (37.3 C) (09/02 0513) Pulse Rate:  [74-89] 89 (09/02 0513) Resp:  [18] 18 (09/02 0513) BP: (120-136)/(53-62) 120/57 mmHg (09/02 0513) SpO2:  [98 %-100 %] 98 % (09/02 0513) Physical Exam: General: 80 yo male resting comfortably and sitting in bed and in no apparent distress Cardiovascular: S1S2,regular rate and rhythm, no murmurs Respiratory: CTAB anteriorly, no wheeze/rhonchi/rales, no increased work of breathing Abdomen: flat, soft and nondistended, no tenderness or masses to palpation Extremities: no edema noted  Laboratory:  Recent Labs Lab 07/05/14 0650 07/06/14 0519 07/07/14 0615  WBC 9.9 10.5 11.8*  HGB 11.3* 9.1* 7.9*  HCT 32.7* 26.5* 22.7*  PLT 227 203 202    Recent Labs Lab 07/03/14 2147  07/05/14 0650 07/06/14 0519 07/07/14 0615  NA 142  < > 136* 138 138  K 4.8  < > 3.8 4.5 3.9  CL 107  < > 105 106 107  CO2 26  < > 20 23 22  BUN 15  < > 10 8 8   CREATININE 1.05  < > 0.85 0.84 0.80  CALCIUM 9.3  < > 7.9* 7.9* 7.8*  PROT 7.2  --   --   --   --   BILITOT 0.8  --   --   --   --   ALKPHOS 73  --   --   --   --   ALT 15  --   --   --   --   AST 16  --   --   --   --   GLUCOSE 138*  < > 105* 143* 123*  < > = values in this interval not displayed. Imaging/Diagnostic  Tests: Dg Pelvis 1-2 Views  07/03/2014   CLINICAL DATA:  Fall.  Proximal femur pain.  EXAM: PELVIS - 1-2 VIEW  COMPARISON:  None.  FINDINGS: An oblique fracture of the proximal femur begins at the lesser trochanter and extends inferiorly and laterally. The hip remains located. Degenerative changes are noted in the SI joints. The pelvis is intact. Prostate seeds are noted.  IMPRESSION: 1. Oblique proximal femur fracture beginning at the lesser trochanter.   Electronically Signed   By: Lawrence Santiago M.D.   On: 07/03/2014 21:11   Dg Femur Left  07/05/2014   CLINICAL DATA:  ORIF of a left proximal femur fracture.  EXAM: LEFT FEMUR - 2 VIEW; DG C-ARM 61-120 MIN  COMPARISON:  07/05/2014 at 1233 hr  FINDINGS: Submitted portable images show placement of 2 screws supported by a long intra medullary rod reducing the major intertrochanteric fracture fragments into near anatomic alignment. There is no new fracture or evidence of an operative complication.  IMPRESSION: ORIF of a proximal left femur fracture as described.   Electronically Signed   By: Lajean Manes M.D.   On: 07/05/2014 18:00   Dg Femur Left  07/03/2014   CLINICAL DATA:  80 year old male right leg pain following fall.  EXAM: LEFT FEMUR - 2 VIEW  COMPARISON:  None.  FINDINGS: An oblique/spiral type fracture of the proximal left femoral diaphysis is noted with 3.5 cm posterior and medial displacement.  There is no evidence of subluxation or dislocation.  No other acute bony abnormalities are noted.  Prostate seeds are present.  IMPRESSION: Displaced proximal left femoral fracture.   Electronically Signed   By: Hassan Rowan M.D.   On: 07/03/2014 21:12   Ct Hip Left Wo Contrast  07/05/2014   CLINICAL DATA:  Status post fall of the left femur fracture. History of prostate cancer. Question pathologic fracture.  EXAM: CT OF THE LEFT HIP WITHOUT CONTRAST  TECHNIQUE: Multidetector CT imaging was performed according to the standard protocol. Multiplanar CT image  reconstructions were also generated.  COMPARISON:  Plain films left femur 07/03/2014.  FINDINGS: As seen on the patient's plain films, there is a proximal left femur fracture. No finding to suggest pathologic fracture is present. No focal bony lesion is seen. The fracture extends from the lesser trochanter in an inferior in an oblique orientation through the lateral cortex of femur approximately 11 cm below the top of the greater trochanter. The distal fragment demonstrates approximately 1/2 shaft width medial displacement. Fragments are distracted up to 2.6 cm. Contusion and hematoma about the patient's fracture are noted. Imaged intrapelvic contents demonstrate radiotherapy seeds in the prostate gland.  IMPRESSION: Proximal left femur fracture has no finding to suggest pathologic injury.   Electronically Signed   By: Inge Rise M.D.  On: 07/05/2014 13:23   Dg C-arm 61-120 Min  07/05/2014   CLINICAL DATA:  ORIF of a left proximal femur fracture.  EXAM: LEFT FEMUR - 2 VIEW; DG C-ARM 61-120 MIN  COMPARISON:  07/05/2014 at 1233 hr  FINDINGS: Submitted portable images show placement of 2 screws supported by a long intra medullary rod reducing the major intertrochanteric fracture fragments into near anatomic alignment. There is no new fracture or evidence of an operative complication.  IMPRESSION: ORIF of a proximal left femur fracture as described.   Electronically Signed   By: Lajean Manes M.D.   On: 07/05/2014 18:00     Lorna Few, DO 07/07/2014, 9:33 AM PGY-1, [place] Intern pager: [phone], text pages welcome

## 2014-07-07 NOTE — Discharge Summary (Signed)
I agree with the discharge summary as documented.   Amando Ishikawa MD  

## 2014-07-07 NOTE — Clinical Social Work Psychosocial (Signed)
Clinical Social Work Department BRIEF PSYCHOSOCIAL ASSESSMENT 07/07/2014  Patient:  [name],[name]     Account Number:  [id number]     Admit date:  07/03/2014  Clinical Social Worker:  Wylene Men  Date/Time:  07/07/2014 11:27 AM  Referred by:  Physician  Date Referred:  07/07/2014 Referred for  SNF Placement  Psychosocial assessment   Other Referral:   none   Interview type:  Other - See comment Other interview type:   Maralyn Sago    PSYCHOSOCIAL DATA Living Status:  FAMILY Admitted from facility:   Level of care:   Primary support name:  Merrilee Seashore Primary support relationship to patient:  FAMILY Degree of support available:   strong    CURRENT CONCERNS Current Concerns  Post-Acute Placement   Other Concerns:   none    SOCIAL WORK ASSESSMENT / PLAN CSW assessed pt at beside.  Pt was alert and orientec x4. Pt expressed frustration regarding his  limited mobility. CSW offered support.  Pt is hopeful regarding prognosis and STR.  CSW introduced self and CSW role as well as SNF/STR process.  PT has recommended SNF.  Pt and family are agreeable.  Pt lives in [place] and is requesting [facility] for rehab.  CSW contacted Mardene Celeste at [place], [place] who states they can offer a bed to the pt once medically stable.  Pt will be transported via [facility].   Assessment/plan status:  Psychosocial Support/Ongoing Assessment of Needs Other assessment/ plan:   FL2  PASARR   Information/referral to community resources:   SNF    PATIENT'S/FAMILY'S RESPONSE TO PLAN OF CARE: Pt and family are agreeable to SNF and appreciative of CSW assistance and support.       Nonnie Done, [place] [phone]  Psychiatric & Orthopedics (5N 1-16) Clinical Social Worker

## 2014-07-07 NOTE — Progress Notes (Signed)
Physical Therapy Treatment Patient Details Name: [name] MRN: [id number] DOB: [birth year] Today's Date: 07/07/2014    History of Present Illness      PT Comments    Pt demo difficulty with motor planning and initiation of movement.  Unable to consistently progress RLE through swing phase for gait.  This is probably due to L hip pain with weight shift to LLE.  Due to cognitive deficits, it is difficult for pt to understand 50% PWB.  PT to continue per POC.  Follow Up Recommendations  SNF     Equipment Recommendations  Rolling walker with 5" wheels;3in1 (PT)    Recommendations for Other Services       Precautions / Restrictions Precautions Precautions: Fall Restrictions Weight Bearing Restrictions: Yes LLE Weight Bearing: Partial weight bearing LLE Partial Weight Bearing Percentage or Pounds: 50 %    Mobility  Bed Mobility                  Transfers   Equipment used: Rolling walker (2 wheeled)   Sit to Stand: +2 physical assistance;Mod assist         General transfer comment: verbal cues for sequencing, hand placement  Ambulation/Gait Ambulation/Gait assistance: +2 safety/equipment;Mod assist Ambulation Distance (Feet): 5 Feet Assistive device: Rolling walker (2 wheeled) Gait Pattern/deviations: Step-to pattern;Antalgic;Decreased stride length Gait velocity: very slow       Financial trader Rankin (Stroke Patients Only)       Balance                                    Cognition Arousal/Alertness: Awake/alert Behavior During Therapy: WFL for tasks assessed/performed Overall Cognitive Status: History of cognitive impairments - at baseline                      Exercises      General Comments        Pertinent Vitals/Pain Pain Assessment: Faces Faces Pain Scale: Hurts little more Pain Location: L hip Pain Intervention(s): Monitored during session;Repositioned    Home  Living                      Prior Function            PT Goals (current goals can now be found in the care plan section) Progress towards PT goals: Progressing toward goals    Frequency  Min 3X/week    PT Plan Current plan remains appropriate    Co-evaluation             End of Session Equipment Utilized During Treatment: Gait belt Activity Tolerance: Patient limited by fatigue;Patient limited by pain Patient left: in chair;with call bell/phone within reach;with family/visitor present     Time: 0814-4818 PT Time Calculation (min): 32 min  Charges:  $Gait Training: 8-22 mins $Therapeutic Activity: 8-22 mins                    G Codes:      Lorriane Shire 07/07/2014, 12:26 PM

## 2014-07-07 NOTE — Care Management Note (Signed)
CARE MANAGEMENT NOTE 07/07/2014  Patient:  [name],[name]   Account Number:  [id number]  Date Initiated:  07/07/2014  Documentation initiated by:  Ricki Miller  Subjective/Objective Assessment:   80 yr old male admitted with left hip fracture, s/p Left hip IM Nailing.     Action/Plan:   Patient has been evaluated by physical therapy, needs SNF. Patient's family would like him evaluated by CIR, CM will follow.  Patient is not a candidate for CIR, will go to [facility] today.   Anticipated DC Date:  07/07/2014   Anticipated DC Plan:  SKILLED NURSING FACILITY  In-house referral  Clinical Social Worker      DC Planning Services  CM consult      [facility] Choice  NA   Choice offered to / List presented to:  NA   DME arranged  NA        HH arranged  NA      Status of service:  Completed, signed off Medicare Important Message given?  YES (If response is "NO", the following Medicare IM given date fields will be blank) Date Medicare IM given:  07/07/2014 Medicare IM given by:  Ricki Miller Date Additional Medicare IM given:   Additional Medicare IM given by:    Discharge Disposition:  [place]  Per UR Regulation:  Reviewed for med. necessity/level of care/duration of stay

## 2014-09-09 ENCOUNTER — Ambulatory Visit: Payer: Medicare Other | Attending: Orthopaedic Surgery | Admitting: Physical Therapy

## 2014-09-09 DIAGNOSIS — M810 Age-related osteoporosis without current pathological fracture: Secondary | ICD-10-CM | POA: Diagnosis not present

## 2014-09-09 DIAGNOSIS — Z5189 Encounter for other specified aftercare: Secondary | ICD-10-CM | POA: Insufficient documentation

## 2014-09-09 DIAGNOSIS — M25559 Pain in unspecified hip: Secondary | ICD-10-CM | POA: Insufficient documentation

## 2014-09-09 DIAGNOSIS — R001 Bradycardia, unspecified: Secondary | ICD-10-CM | POA: Diagnosis not present

## 2014-09-09 DIAGNOSIS — R260 Ataxic gait: Secondary | ICD-10-CM | POA: Diagnosis not present

## 2014-09-14 ENCOUNTER — Ambulatory Visit: Payer: Medicare Other | Admitting: Physical Therapy

## 2014-09-14 DIAGNOSIS — Z5189 Encounter for other specified aftercare: Secondary | ICD-10-CM | POA: Diagnosis not present

## 2014-09-15 ENCOUNTER — Ambulatory Visit: Payer: Medicare Other | Admitting: Physical Therapy

## 2014-09-15 DIAGNOSIS — Z5189 Encounter for other specified aftercare: Secondary | ICD-10-CM | POA: Diagnosis not present

## 2014-09-16 ENCOUNTER — Ambulatory Visit: Payer: Medicare Other | Admitting: Physical Therapy

## 2014-09-16 DIAGNOSIS — Z5189 Encounter for other specified aftercare: Secondary | ICD-10-CM | POA: Diagnosis not present

## 2014-09-21 ENCOUNTER — Ambulatory Visit: Payer: Medicare Other | Admitting: *Deleted

## 2014-09-21 DIAGNOSIS — Z5189 Encounter for other specified aftercare: Secondary | ICD-10-CM | POA: Diagnosis not present

## 2014-09-22 ENCOUNTER — Ambulatory Visit: Payer: Medicare Other | Admitting: Physical Therapy

## 2014-09-22 DIAGNOSIS — Z5189 Encounter for other specified aftercare: Secondary | ICD-10-CM | POA: Diagnosis not present

## 2014-09-23 ENCOUNTER — Ambulatory Visit: Payer: Medicare Other | Admitting: *Deleted

## 2014-09-23 DIAGNOSIS — Z5189 Encounter for other specified aftercare: Secondary | ICD-10-CM | POA: Diagnosis not present

## 2014-09-28 ENCOUNTER — Ambulatory Visit: Payer: Medicare Other | Admitting: *Deleted

## 2014-09-28 DIAGNOSIS — Z5189 Encounter for other specified aftercare: Secondary | ICD-10-CM | POA: Diagnosis not present

## 2014-10-05 ENCOUNTER — Ambulatory Visit: Payer: Medicare Other | Attending: Orthopaedic Surgery | Admitting: *Deleted

## 2014-10-05 DIAGNOSIS — M25559 Pain in unspecified hip: Secondary | ICD-10-CM | POA: Insufficient documentation

## 2014-10-05 DIAGNOSIS — M810 Age-related osteoporosis without current pathological fracture: Secondary | ICD-10-CM | POA: Insufficient documentation

## 2014-10-05 DIAGNOSIS — R260 Ataxic gait: Secondary | ICD-10-CM | POA: Insufficient documentation

## 2014-10-05 DIAGNOSIS — R001 Bradycardia, unspecified: Secondary | ICD-10-CM | POA: Insufficient documentation

## 2014-10-05 DIAGNOSIS — Z5189 Encounter for other specified aftercare: Secondary | ICD-10-CM | POA: Insufficient documentation

## 2014-10-06 ENCOUNTER — Ambulatory Visit: Payer: Medicare Other | Admitting: Physical Therapy

## 2014-10-06 DIAGNOSIS — R001 Bradycardia, unspecified: Secondary | ICD-10-CM | POA: Diagnosis not present

## 2014-10-06 DIAGNOSIS — M810 Age-related osteoporosis without current pathological fracture: Secondary | ICD-10-CM | POA: Diagnosis not present

## 2014-10-06 DIAGNOSIS — R260 Ataxic gait: Secondary | ICD-10-CM | POA: Diagnosis not present

## 2014-10-06 DIAGNOSIS — Z5189 Encounter for other specified aftercare: Secondary | ICD-10-CM | POA: Diagnosis not present

## 2014-10-06 DIAGNOSIS — M25559 Pain in unspecified hip: Secondary | ICD-10-CM | POA: Diagnosis not present

## 2014-10-07 ENCOUNTER — Ambulatory Visit: Payer: Medicare Other | Admitting: *Deleted

## 2014-10-07 DIAGNOSIS — Z5189 Encounter for other specified aftercare: Secondary | ICD-10-CM | POA: Diagnosis not present

## 2014-10-12 ENCOUNTER — Ambulatory Visit: Payer: Medicare Other | Admitting: Physical Therapy

## 2014-10-12 DIAGNOSIS — Z5189 Encounter for other specified aftercare: Secondary | ICD-10-CM | POA: Diagnosis not present

## 2014-10-14 ENCOUNTER — Ambulatory Visit: Payer: Medicare Other | Admitting: Physical Therapy

## 2014-10-14 DIAGNOSIS — Z5189 Encounter for other specified aftercare: Secondary | ICD-10-CM | POA: Diagnosis not present

## 2014-10-19 ENCOUNTER — Encounter: Payer: Medicare Other | Admitting: *Deleted

## 2014-10-21 ENCOUNTER — Ambulatory Visit: Payer: Medicare Other | Admitting: *Deleted

## 2014-10-21 DIAGNOSIS — Z5189 Encounter for other specified aftercare: Secondary | ICD-10-CM | POA: Diagnosis not present

## 2014-10-25 ENCOUNTER — Ambulatory Visit: Payer: Medicare Other | Admitting: *Deleted

## 2014-10-25 DIAGNOSIS — Z5189 Encounter for other specified aftercare: Secondary | ICD-10-CM | POA: Diagnosis not present

## 2014-10-27 ENCOUNTER — Ambulatory Visit: Payer: Medicare Other | Admitting: Physical Therapy

## 2014-10-27 DIAGNOSIS — Z5189 Encounter for other specified aftercare: Secondary | ICD-10-CM | POA: Diagnosis not present

## 2014-11-02 ENCOUNTER — Ambulatory Visit: Payer: Medicare Other | Admitting: *Deleted

## 2014-11-02 DIAGNOSIS — Z5189 Encounter for other specified aftercare: Secondary | ICD-10-CM | POA: Diagnosis not present

## 2014-11-03 ENCOUNTER — Ambulatory Visit: Payer: Medicare Other | Admitting: Physical Therapy

## 2014-11-03 DIAGNOSIS — Z5189 Encounter for other specified aftercare: Secondary | ICD-10-CM | POA: Diagnosis not present

## 2014-11-04 ENCOUNTER — Ambulatory Visit: Payer: Medicare Other | Admitting: Physical Therapy

## 2014-11-04 DIAGNOSIS — Z5189 Encounter for other specified aftercare: Secondary | ICD-10-CM | POA: Diagnosis not present

## 2014-12-21 ENCOUNTER — Other Ambulatory Visit: Payer: Self-pay | Admitting: *Deleted

## 2014-12-21 NOTE — Telephone Encounter (Signed)
Latanoprost refill requested This is prescribed by Dr Ricki Miller in [place]. Faxed back to [facility] with this information.

## 2015-03-22 ENCOUNTER — Encounter: Payer: Self-pay | Admitting: Neurology

## 2015-03-22 ENCOUNTER — Ambulatory Visit (INDEPENDENT_AMBULATORY_CARE_PROVIDER_SITE_OTHER): Payer: Medicare Other | Admitting: Neurology

## 2015-03-22 VITALS — BP 126/72 | HR 60 | Ht 70.0 in | Wt 156.0 lb

## 2015-03-22 DIAGNOSIS — F0390 Unspecified dementia without behavioral disturbance: Secondary | ICD-10-CM | POA: Diagnosis not present
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
## 2015-03-23 LAB — TSH: TSH: 1.95 u[IU]/mL (ref 0.450–4.500)

## 2015-03-23 LAB — VITAMIN B12: Vitamin B-12: 396 pg/mL (ref 211–946)

## 2015-03-23 LAB — RPR: RPR Ser Ql: NONREACTIVE

## 2015-03-30 ENCOUNTER — Ambulatory Visit
Admission: RE | Admit: 2015-03-30 | Discharge: 2015-03-30 | Disposition: A | Discharge status: Home / Self Care | Payer: Medicare Other | Source: Ambulatory Visit | Attending: Neurology | Admitting: Neurology

## 2015-03-30 DIAGNOSIS — F0390 Unspecified dementia without behavioral disturbance: Secondary | ICD-10-CM | POA: Diagnosis not present

## 2015-03-31 ENCOUNTER — Telehealth: Payer: Self-pay | Admitting: Neurology

## 2015-03-31 NOTE — Telephone Encounter (Signed)
Spoke to [employer], Axel's brother on HIPPA.  He is aware of results and will come with his brother to the follow up appt to further discuss.

## 2015-03-31 NOTE — Telephone Encounter (Addendum)
[name], please call patient, MRI of the brain showed significant abnormality, moderate to severe atrophy, small vessel disease,  I will review films at his next follow-up visit  Abnormal MRI brain (without) demonstrating: 1. Small, 66mm, late-subacute left frontal, cortical, intracerebral hemorrhage. No mass effect or midline shift. 2. Moderate-severe periventricular and subcortical chronic small vessel ischemic disease.  3. ~7 scattered chronic cerebral microhemorrhages, may be related to chronic small vessel ischemic disease or amyloid angiopathy. 4. Moderate-severe perisylvian and severe mesial temporal atrophy.

## 2015-04-27 ENCOUNTER — Encounter: Payer: Self-pay | Admitting: Neurology

## 2015-04-27 ENCOUNTER — Ambulatory Visit (INDEPENDENT_AMBULATORY_CARE_PROVIDER_SITE_OTHER): Payer: Medicare Other | Admitting: Neurology

## 2015-04-27 VITALS — BP 128/62 | HR 64

## 2015-04-27 DIAGNOSIS — F0390 Unspecified dementia without behavioral disturbance: Secondary | ICD-10-CM

## 2015-04-27 DIAGNOSIS — R269 Unspecified abnormalities of gait and mobility: Secondary | ICD-10-CM

## 2015-04-27 MED ORDER — MEMANTINE HCL 10 MG PO TABS: 10.0000 mg | ORAL_TABLET | Freq: Two times a day (BID) | ORAL | Status: DC
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
## 2015-07-28 ENCOUNTER — Ambulatory Visit: Payer: Medicare Other | Admitting: Neurology

## 2015-08-09 ENCOUNTER — Ambulatory Visit (INDEPENDENT_AMBULATORY_CARE_PROVIDER_SITE_OTHER): Payer: Medicare Other | Admitting: Neurology

## 2015-08-09 ENCOUNTER — Encounter: Payer: Self-pay | Admitting: Neurology

## 2015-08-09 VITALS — BP 128/80 | HR 64 | Ht 70.0 in | Wt 149.5 lb

## 2015-08-09 DIAGNOSIS — F0390 Unspecified dementia without behavioral disturbance: Secondary | ICD-10-CM

## 2015-08-09 DIAGNOSIS — R269 Unspecified abnormalities of gait and mobility: Secondary | ICD-10-CM | POA: Diagnosis not present

## 2015-08-09 MED ORDER — DONEPEZIL HCL 10 MG PO TABS: 10.0000 mg | ORAL_TABLET | Freq: Every day | ORAL | Status: DC
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
## 2015-09-28 ENCOUNTER — Other Ambulatory Visit: Payer: Self-pay

## 2015-09-28 MED ORDER — MEMANTINE HCL 10 MG PO TABS: 10.0000 mg | ORAL_TABLET | Freq: Two times a day (BID) | ORAL | Status: DC

## 2015-09-28 MED ORDER — DONEPEZIL HCL 10 MG PO TABS: 10.0000 mg | ORAL_TABLET | Freq: Every day | ORAL | Status: DC

## 2016-02-08 ENCOUNTER — Ambulatory Visit: Payer: Medicare Other | Admitting: Nurse Practitioner

## 2016-02-21 ENCOUNTER — Ambulatory Visit (INDEPENDENT_AMBULATORY_CARE_PROVIDER_SITE_OTHER): Payer: Medicare Other | Admitting: Nurse Practitioner

## 2016-02-21 ENCOUNTER — Encounter: Payer: Self-pay | Admitting: Nurse Practitioner

## 2016-02-21 VITALS — BP 108/65 | HR 54 | Ht 70.0 in | Wt 135.8 lb

## 2016-02-21 DIAGNOSIS — R413 Other amnesia: Secondary | ICD-10-CM | POA: Insufficient documentation

## 2016-02-21 NOTE — Progress Notes (Signed)
I have reviewed and agreed above plan. 

## 2016-02-21 NOTE — Patient Instructions (Signed)
Continue Aricept 10 mg daily Continue Namenda 10 twice daily Use cane at all times to prevent falls Try [employer] between meals Follow-up in 6 months
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
## 2016-08-22 ENCOUNTER — Encounter: Payer: Self-pay | Admitting: Nurse Practitioner

## 2016-08-22 ENCOUNTER — Ambulatory Visit (INDEPENDENT_AMBULATORY_CARE_PROVIDER_SITE_OTHER): Payer: Medicare Other | Admitting: Nurse Practitioner

## 2016-08-22 VITALS — BP 128/71 | HR 59 | Ht 70.0 in | Wt 133.0 lb

## 2016-08-22 DIAGNOSIS — R413 Other amnesia: Secondary | ICD-10-CM | POA: Diagnosis not present

## 2016-08-22 MED ORDER — MEMANTINE HCL 10 MG PO TABS: 10.0000 mg | ORAL_TABLET | Freq: Two times a day (BID) | ORAL | 3 refills | Status: DC

## 2016-08-22 MED ORDER — DONEPEZIL HCL 10 MG PO TABS: 10.0000 mg | ORAL_TABLET | Freq: Every day | ORAL | 3 refills | Status: DC
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
## 2016-08-22 NOTE — Patient Instructions (Signed)
Continue Aricept 10 mg dailywill refill Continue Namenda 10 twice daily, will refill Use cane at all times to prevent falls Follow-up in 6 months, next with Dr. Krista Blue

## 2016-08-24 NOTE — Progress Notes (Signed)
I have reviewed and agreed above plan. 

## 2017-02-21 ENCOUNTER — Encounter (INDEPENDENT_AMBULATORY_CARE_PROVIDER_SITE_OTHER): Payer: Self-pay

## 2017-02-21 ENCOUNTER — Encounter: Payer: Self-pay | Admitting: Neurology

## 2017-02-21 ENCOUNTER — Ambulatory Visit (INDEPENDENT_AMBULATORY_CARE_PROVIDER_SITE_OTHER): Payer: Medicare Other | Admitting: Neurology

## 2017-02-21 VITALS — BP 131/75 | HR 62 | Ht 70.0 in | Wt 145.5 lb

## 2017-02-21 DIAGNOSIS — F02818 Dementia in other diseases classified elsewhere, unspecified severity, with other behavioral disturbance: Secondary | ICD-10-CM

## 2017-02-21 DIAGNOSIS — F0281 Dementia in other diseases classified elsewhere with behavioral disturbance: Secondary | ICD-10-CM

## 2017-02-21 DIAGNOSIS — G308 Other Alzheimer's disease: Secondary | ICD-10-CM
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
## 2017-04-05 ENCOUNTER — Telehealth: Payer: Self-pay | Admitting: Neurology

## 2017-04-05 DIAGNOSIS — I639 Cerebral infarction, unspecified: Secondary | ICD-10-CM

## 2017-04-05 HISTORY — DX: Cerebral infarction, unspecified: I63.9

## 2017-04-05 NOTE — Telephone Encounter (Signed)
I was able to talked with his brother Codi Kertz, will email him more info about dementia trial April 05 2017

## 2017-04-27 ENCOUNTER — Emergency Department (HOSPITAL_COMMUNITY): Payer: Medicare Other

## 2017-04-27 ENCOUNTER — Encounter (HOSPITAL_COMMUNITY): Payer: Self-pay | Admitting: *Deleted

## 2017-04-27 ENCOUNTER — Inpatient Hospital Stay (HOSPITAL_COMMUNITY)
Admission: EM | Admit: 2017-04-27 | Discharge: 2017-05-02 | DRG: 064 | Disposition: A | Discharge status: Skilled Nursing Facility | Payer: Medicare Other | Attending: Internal Medicine | Admitting: Internal Medicine

## 2017-04-27 DIAGNOSIS — R404 Transient alteration of awareness: Secondary | ICD-10-CM

## 2017-04-27 DIAGNOSIS — R55 Syncope and collapse: Secondary | ICD-10-CM | POA: Diagnosis present

## 2017-04-27 DIAGNOSIS — R4189 Other symptoms and signs involving cognitive functions and awareness: Secondary | ICD-10-CM | POA: Diagnosis not present

## 2017-04-27 DIAGNOSIS — R001 Bradycardia, unspecified: Secondary | ICD-10-CM | POA: Diagnosis present

## 2017-04-27 DIAGNOSIS — I959 Hypotension, unspecified: Secondary | ICD-10-CM | POA: Diagnosis present

## 2017-04-27 DIAGNOSIS — E860 Dehydration: Secondary | ICD-10-CM | POA: Diagnosis present

## 2017-04-27 DIAGNOSIS — Z8673 Personal history of transient ischemic attack (TIA), and cerebral infarction without residual deficits: Secondary | ICD-10-CM

## 2017-04-27 DIAGNOSIS — R29700 NIHSS score 0: Secondary | ICD-10-CM | POA: Diagnosis present

## 2017-04-27 DIAGNOSIS — N179 Acute kidney failure, unspecified: Secondary | ICD-10-CM | POA: Diagnosis present

## 2017-04-27 DIAGNOSIS — M6282 Rhabdomyolysis: Secondary | ICD-10-CM | POA: Diagnosis present

## 2017-04-27 DIAGNOSIS — F0150 Vascular dementia without behavioral disturbance: Secondary | ICD-10-CM | POA: Diagnosis present

## 2017-04-27 DIAGNOSIS — R4781 Slurred speech: Secondary | ICD-10-CM | POA: Diagnosis present

## 2017-04-27 DIAGNOSIS — I6340 Cerebral infarction due to embolism of unspecified cerebral artery: Secondary | ICD-10-CM | POA: Diagnosis not present

## 2017-04-27 DIAGNOSIS — F0390 Unspecified dementia without behavioral disturbance: Secondary | ICD-10-CM | POA: Diagnosis present

## 2017-04-27 DIAGNOSIS — I10 Essential (primary) hypertension: Secondary | ICD-10-CM | POA: Diagnosis present

## 2017-04-27 DIAGNOSIS — I639 Cerebral infarction, unspecified: Secondary | ICD-10-CM

## 2017-04-27 DIAGNOSIS — G309 Alzheimer's disease, unspecified: Secondary | ICD-10-CM | POA: Diagnosis present

## 2017-04-27 DIAGNOSIS — T796XXA Traumatic ischemia of muscle, initial encounter: Secondary | ICD-10-CM | POA: Diagnosis present

## 2017-04-27 DIAGNOSIS — I739 Peripheral vascular disease, unspecified: Secondary | ICD-10-CM | POA: Diagnosis present

## 2017-04-27 DIAGNOSIS — Z8546 Personal history of malignant neoplasm of prostate: Secondary | ICD-10-CM

## 2017-04-27 DIAGNOSIS — Z79899 Other long term (current) drug therapy: Secondary | ICD-10-CM

## 2017-04-27 DIAGNOSIS — N390 Urinary tract infection, site not specified: Secondary | ICD-10-CM | POA: Diagnosis present

## 2017-04-27 DIAGNOSIS — Z66 Do not resuscitate: Secondary | ICD-10-CM | POA: Diagnosis present

## 2017-04-27 DIAGNOSIS — R29705 NIHSS score 5: Secondary | ICD-10-CM | POA: Diagnosis not present

## 2017-04-27 DIAGNOSIS — F0280 Dementia in other diseases classified elsewhere without behavioral disturbance: Secondary | ICD-10-CM | POA: Diagnosis present

## 2017-04-27 DIAGNOSIS — G9349 Other encephalopathy: Secondary | ICD-10-CM | POA: Diagnosis present

## 2017-04-27 DIAGNOSIS — N19 Unspecified kidney failure: Secondary | ICD-10-CM

## 2017-04-27 DIAGNOSIS — Z7982 Long term (current) use of aspirin: Secondary | ICD-10-CM

## 2017-04-27 LAB — CBC WITH DIFFERENTIAL/PLATELET
Basophils Absolute: 0 10*3/uL (ref 0.0–0.1)
Basophils Relative: 0 %
EOS PCT: 0 %
Eosinophils Absolute: 0 10*3/uL (ref 0.0–0.7)
HCT: 39.1 % (ref 39.0–52.0)
Hemoglobin: 12.9 g/dL — ABNORMAL LOW (ref 13.0–17.0)
LYMPHS ABS: 0.5 10*3/uL — AB (ref 0.7–4.0)
Lymphocytes Relative: 4 %
MCH: 30.9 pg (ref 26.0–34.0)
MCHC: 33 g/dL (ref 30.0–36.0)
MCV: 93.5 fL (ref 78.0–100.0)
Monocytes Absolute: 0.7 10*3/uL (ref 0.1–1.0)
Monocytes Relative: 6 %
Neutro Abs: 11 10*3/uL — ABNORMAL HIGH (ref 1.7–7.7)
Neutrophils Relative %: 90 %
Platelets: 267 10*3/uL (ref 150–400)
RBC: 4.18 MIL/uL — AB (ref 4.22–5.81)
RDW: 14.9 % (ref 11.5–15.5)
WBC: 12.2 10*3/uL — AB (ref 4.0–10.5)

## 2017-04-27 LAB — COMPREHENSIVE METABOLIC PANEL
ALBUMIN: 3.7 g/dL (ref 3.5–5.0)
ALK PHOS: 72 U/L (ref 38–126)
ALT: 23 U/L (ref 17–63)
AST: 27 U/L (ref 15–41)
Anion gap: 9 (ref 5–15)
BUN: 20 mg/dL (ref 6–20)
CALCIUM: 9.1 mg/dL (ref 8.9–10.3)
CO2: 25 mmol/L (ref 22–32)
CREATININE: 1.38 mg/dL — AB (ref 0.61–1.24)
Chloride: 107 mmol/L (ref 101–111)
GFR calc Af Amer: 53 mL/min — ABNORMAL LOW (ref >60)
GFR calc non Af Amer: 46 mL/min — ABNORMAL LOW (ref >60)
GLUCOSE: 128 mg/dL — AB (ref 65–99)
Potassium: 4.4 mmol/L (ref 3.5–5.1)
SODIUM: 141 mmol/L (ref 135–145)
Total Bilirubin: 1.3 mg/dL — ABNORMAL HIGH (ref 0.3–1.2)
Total Protein: 6.8 g/dL (ref 6.5–8.1)

## 2017-04-27 LAB — URINALYSIS, ROUTINE W REFLEX MICROSCOPIC
Bilirubin Urine: NEGATIVE
Glucose, UA: NEGATIVE mg/dL
Ketones, ur: 5 mg/dL — AB
Nitrite: NEGATIVE
Protein, ur: 30 mg/dL — AB
Specific Gravity, Urine: 1.014 (ref 1.005–1.030)
pH: 5 (ref 5.0–8.0)

## 2017-04-27 LAB — TROPONIN I: Troponin I: <0.03 ng/mL (ref <0.03)

## 2017-04-27 LAB — CK: Total CK: 732 U/L — ABNORMAL HIGH (ref 49–397)

## 2017-04-27 LAB — LACTIC ACID, PLASMA: Lactic Acid, Venous: 1.8 mmol/L (ref 0.5–1.9)

## 2017-04-27 MED ORDER — SODIUM CHLORIDE 0.9% FLUSH
3.0000 mL | Freq: Two times a day (BID) | INTRAVENOUS | Status: DC
  Administered 2017-04-28 – 2017-05-02 (×3): 3 mL via INTRAVENOUS

## 2017-04-27 MED ORDER — MEMANTINE HCL 10 MG PO TABS
10.0000 mg | ORAL_TABLET | Freq: Two times a day (BID) | ORAL | Status: DC
  Administered 2017-04-27 – 2017-05-02 (×9): 10 mg via ORAL
  Filled 2017-04-27 (×11): qty 1

## 2017-04-27 MED ORDER — DONEPEZIL HCL 10 MG PO TABS
10.0000 mg | ORAL_TABLET | Freq: Every day | ORAL | Status: DC
  Administered 2017-04-27 – 2017-05-01 (×4): 10 mg via ORAL
  Filled 2017-04-27: qty 2
  Filled 2017-04-27 (×2): qty 1
  Filled 2017-04-27: qty 2

## 2017-04-27 MED ORDER — SODIUM CHLORIDE 0.9 % IV BOLUS (SEPSIS)
500.0000 mL | Freq: Once | INTRAVENOUS | Status: AC
  Administered 2017-04-27: 500 mL via INTRAVENOUS

## 2017-04-27 MED ORDER — LATANOPROST 0.005 % OP SOLN
1.0000 [drp] | Freq: Every day | OPHTHALMIC | Status: DC
  Administered 2017-05-01: 1 [drp] via OPHTHALMIC
  Filled 2017-04-27 (×2): qty 2.5

## 2017-04-27 MED ORDER — SODIUM CHLORIDE 0.9 % IV SOLN
INTRAVENOUS | Status: DC
  Administered 2017-04-27 – 2017-05-01 (×6): via INTRAVENOUS

## 2017-04-27 MED ORDER — HEPARIN SODIUM (PORCINE) 5000 UNIT/ML IJ SOLN
5000.0000 [IU] | Freq: Three times a day (TID) | INTRAMUSCULAR | Status: DC
  Administered 2017-04-27 – 2017-04-28 (×2): 5000 [IU] via SUBCUTANEOUS
  Filled 2017-04-27 (×2): qty 1

## 2017-04-27 NOTE — ED Notes (Signed)
Pt unable to stand without maximum assistance, pt leans to the right side while trying to stand or sit.

## 2017-04-27 NOTE — ED Triage Notes (Signed)
Pt was found by a neighbor sitting on the side of the porch unresponsive this afternoon, unsure of how long pt had been outside, has caretaker that leaves at 1pm and did not report any difference in pt today to family, upon ems arrival, pt slow to respond, bp 70/40, pt diaphoretic, cbg 155, pt given 600cc normal saline by ems with improvement in metal status and bp, upon arrival to er, pt at baseline mentally per family but reports that speech is slurred,

## 2017-04-27 NOTE — ED Provider Notes (Signed)
[facility] DEPT Provider Note   CSN: [id number] Arrival date & time: 04/27/17  1921     History   Chief Complaint Chief Complaint  Patient presents with  . Heat Exposure  . Altered Mental Status    HPI [name] is a 83 y.o. male.  The history is provided by the patient and a relative. The history is limited by the condition of the patient (Hx dementia).  Altered Mental Status    Pt was seen at [facility]. Per EMS and pt's family: Pt was found by neighbor at 1600 today, sitting on his porch, unresponsive. Pt's family states his caregiver leaves at 1300 and did not report any change in pt's baseline to family. Last seen by family yesterday. Unknown for how long pt was outside today. EMS states pt was slumped over, unresponsive initially, then slowly became more responsive. BP was "70/40" and he was diaphoretic, CBG 155. EMS gave IV NS 640ml bolus en route with improvement in alertness. Family states pt is at his baseline now, but feel that his speech is slurred. Pt himself has hx of dementia and does not know why he is here.   Past Medical History:  Diagnosis Date  . Glaucoma   . Memory loss   . Prostate cancer ([place]) 2004  . Tubular adenoma polyp of rectum 10/08/2013    Patient Active Problem List   Diagnosis Date Noted  . Memory loss 02/21/2016  . Closed femur fracture ([place]) 07/03/2014  . Femur fracture, left ([place]) 07/03/2014  . Bradycardia 07/03/2014  . Abdominal pain, other specified site 10/14/2013  . Nausea & vomiting 10/14/2013    Past Surgical History:  Procedure Laterality Date  . FEMUR IM NAIL Left 07/05/2014   Procedure: INTRAMEDULLARY (IM) NAIL FEMORAL;  Surgeon: Marianna Payment, MD;  Location: [facility];  Service: Orthopedics;  Laterality: Left;  . prostate seed implant  2004       Home Medications    Prior to Admission medications   Medication Sig Start Date End Date Taking? Authorizing Provider  aspirin 325 MG tablet Take 325 mg by mouth daily.     [provider]  donepezil (ARICEPT) 10 MG tablet Take 1 tablet (10 mg total) by mouth at bedtime. 08/22/16   Dennie Bible, NP  furosemide (LASIX) 20 MG tablet  01/21/17   [provider]  LUMIGAN 0.01 % SOLN  03/02/15   [provider]  memantine (NAMENDA) 10 MG tablet Take 1 tablet (10 mg total) by mouth 2 (two) times daily. 08/22/16   Dennie Bible, NP  Vitamin D, Cholecalciferol, 1000 UNITS TABS Take by mouth daily.    [provider]    Family History Family History  Problem Relation Age of Onset  . Ovarian cancer Mother   . Heart disease Father   . Aneurysm Father     Social History Social History  Substance Use Topics  . Smoking status: Never Smoker  . Smokeless tobacco: Never Used  . Alcohol use No     Comment: 1 drink a month     Allergies   Patient has no known allergies.   Review of Systems Review of Systems  Unable to perform ROS: Dementia     Physical Exam Updated Vital Signs BP 125/75   Pulse (!) 52   Temp 97.4 F (36.3 C) (Oral)   Resp 20   Wt 65.8 kg (145 lb)   SpO2 99%   BMI 20.81 kg/m    20:36:30  Orthostatic Vital Signs KB  Orthostatic Lying   BP- Lying: 144/84  Pulse- Lying: 53      Orthostatic Sitting  BP- Sitting:  139/92  Pulse- Sitting: 54  20:45:05 ED Notes KB  Pt unable to stand without maximum assistance, pt leans to the right side while trying to stand or sit.    Patient Vitals for the past 24 hrs:  BP Temp Temp src Pulse Resp SpO2 Weight  04/27/17 1931 125/75 97.4 F (36.3 C) Oral (!) 52 20 99 % 65.8 kg (145 lb)     Physical Exam 1945: Physical examination:  Nursing notes reviewed; Vital signs and O2 SAT reviewed;  Constitutional: Well developed, Well nourished, In no acute distress; Head:  Normocephalic, atraumatic; Eyes: EOMI, PERRL, No scleral icterus; ENMT: Mouth and pharynx normal, Mucous membranes dry; Neck: Supple, Full range of motion, No lymphadenopathy;  Cardiovascular: Regular rate and rhythm, No gallop; Respiratory: Breath sounds clear & equal bilaterally, No wheezes. Speaking full sentences with ease, Normal respiratory effort/excursion; Chest: Nontender, Movement normal; Abdomen: Soft, Nontender, Nondistended, Normal bowel sounds; Genitourinary: No CVA tenderness; Extremities: Pulses normal, No tenderness, +1 pedal edema bilat. No calf asymmetry.; Neuro: Awake, alert, confused per baseline.  Major CN grossly intact.  Speech clear. Grips equal.  Pt moves all extremities on stretcher spontaneously and to command without apparent gross focal motor deficits.; Skin: Color normal, Warm, Dry.   ED Treatments / Results  Labs (all labs ordered are listed, but only abnormal results are displayed)   EKG  EKG Interpretation  Date/Time:  Saturday April 27 2017 19:42:30 EDT Ventricular Rate:  54 PR Interval:    QRS Duration: 92 QT Interval:  469 QTC Calculation: 445 R Axis:   -40 Text Interpretation:  Sinus rhythm Borderline prolonged PR interval Left axis deviation Abnormal R-wave progression, early transition Artifact When compared with ECG of 07/03/2014 PR interval has increased Otherwise no significant change Confirmed by [facility]  MD, Nunzio Cory [id number]) on 04/27/2017 7:51:49 PM       Radiology   Procedures Procedures (including critical care time)  Medications Ordered in ED Medications  0.9 %  sodium chloride infusion (not administered)  sodium chloride 0.9 % bolus 500 mL (500 mLs Intravenous Bolus from Bag 04/27/17 1955)     Initial Impression / Assessment and Plan / ED Course  I have reviewed the triage vital signs and the nursing notes.  Pertinent labs & imaging results that were available during my care of the patient were reviewed by me and considered in my medical decision making (see chart for details).  MDM Reviewed: previous chart, nursing note and vitals Reviewed previous: labs and ECG Interpretation: labs, ECG, x-ray and CT  scan    Results for orders placed or performed during the hospital encounter of 04/27/17  Comprehensive metabolic panel  Result Value Ref Range   Sodium 141 135 - 145 mmol/L   Potassium 4.4 3.5 - 5.1 mmol/L   Chloride 107 101 - 111 mmol/L   CO2 25 22 - 32 mmol/L   Glucose, Bld 128 (H) 65 - 99 mg/dL   BUN 20 6 - 20 mg/dL   Creatinine, Ser 1.38 (H) 0.61 - 1.24 mg/dL   Calcium 9.1 8.9 - 10.3 mg/dL   Total Protein 6.8 6.5 - 8.1 g/dL   Albumin 3.7 3.5 - 5.0 g/dL   AST 27 15 - 41 U/L   ALT 23 17 - 63 U/L   Alkaline Phosphatase 72 38 - 126 U/L   Total  Bilirubin 1.3 (H) 0.3 - 1.2 mg/dL   GFR calc non Af Amer 46 (L) >60 mL/min   GFR calc Af Amer 53 (L) >60 mL/min   Anion gap 9 5 - 15  Troponin I  Result Value Ref Range   Troponin I <0.03 <0.03 ng/mL  Lactic acid, plasma  Result Value Ref Range   Lactic Acid, Venous 1.8 0.5 - 1.9 mmol/L  CBC with Differential  Result Value Ref Range   WBC 12.2 (H) 4.0 - 10.5 K/uL   RBC 4.18 (L) 4.22 - 5.81 MIL/uL   Hemoglobin 12.9 (L) 13.0 - 17.0 g/dL   HCT 39.1 39.0 - 52.0 %   MCV 93.5 78.0 - 100.0 fL   MCH 30.9 26.0 - 34.0 pg   MCHC 33.0 30.0 - 36.0 g/dL   RDW 14.9 11.5 - 15.5 %   Platelets 267 150 - 400 K/uL   Neutrophils Relative % 90 %   Neutro Abs 11.0 (H) 1.7 - 7.7 K/uL   Lymphocytes Relative 4 %   Lymphs Abs 0.5 (L) 0.7 - 4.0 K/uL   Monocytes Relative 6 %   Monocytes Absolute 0.7 0.1 - 1.0 K/uL   Eosinophils Relative 0 %   Eosinophils Absolute 0.0 0.0 - 0.7 K/uL   Basophils Relative 0 %   Basophils Absolute 0.0 0.0 - 0.1 K/uL  CK  Result Value Ref Range   Total CK 732 (H) 49 - 397 U/L   Dg Chest 1 View Result Date: 04/27/2017 CLINICAL DATA:  Found unresponsive. EXAM: CHEST 1 VIEW COMPARISON:  Chest x-ray dated 12/25/2007. FINDINGS: Heart size is upper normal. Overall cardiomediastinal silhouette is stable in size and configuration. Lungs are clear. No pleural effusion or pneumothorax seen. Osseous and soft tissue structures about  the chest are unremarkable. IMPRESSION: No active disease. Electronically Signed   By: Franki Cabot M.D.   On: 04/27/2017 20:50   Ct Head Wo Contrast Result Date: 04/27/2017 CLINICAL DATA:  Found unresponsive earlier today. Low blood pressure. Partial resuscitation. EXAM: CT HEAD WITHOUT CONTRAST TECHNIQUE: Contiguous axial images were obtained from the base of the skull through the vertex without intravenous contrast. COMPARISON:  03/30/2015 brain MR. FINDINGS: Brain: No evidence for acute stroke, acute hemorrhage, mass lesion, or extra-axial fluid. There is extreme atrophy, with hydrocephalus ex vacuo. Extensive involvement of the white matter by hypoattenuation, representing chronic microvascular ischemic change. There is an old RIGHT thalamus lacunar infarct. Vascular: No hyperdense vessel or unexpected calcification. Skull: Normal. Negative for fracture or focal lesion. Sinuses/Orbits: No acute findings or layering fluid. BILATERAL cataract extraction. Other: None.  Compared with prior brain MR, similar appearance. IMPRESSION: Severe atrophy and extensive white matter disease. Evidence for chronic cerebral infarction. No acute intracranial findings are evident. Electronically Signed   By: Staci Righter M.D.   On: 04/27/2017 20:22    2150:  BUN/Cr elevated from baseline; IVF bolus (total 1L) given. Pt unable to stand for orthostatic VS due to weakness and "leaning to the right" (family states this is not new).  Dx and testing d/w pt and family.  Questions answered.  Verb understanding, agreeable to admit. T/C to [facility]. Marin Comment, case discussed, including:  HPI, pertinent PM/SHx, VS/PE, dx testing, ED course and treatment:  Agreeable to come to ED for evaluation for admission.    Final Clinical Impressions(s) / ED Diagnoses   Final diagnoses:  None    New Prescriptions New Prescriptions   No medications on file  Francine Graven, DO 04/30/17 [phone]

## 2017-04-27 NOTE — H&P (Signed)
History and Physical    [name] RJJ:[id number] DOB: 11/08/1931 DOA: 04/27/2017  PCP: Dione Housekeeper, MD  Patient coming from: Home.    Chief Complaint:  Found unconscious.   HPI: [name] is an 83 y.o. male with hx of progressive dementia, hx of prostate ca, lives at home with help, found unresponsive on his porch.  He reportedly outside in the hot day today, and neighbors found him unconscious.  No bowel or bladder incontinence.  He was found to be hypotensive, with SBP 70's and was given IVF bolus.  He returned to his baseline mental status in the ER.  He hemodynamics remained stable in the ER.  RN noted that he was leaning toward the right side, but his 2 sons stated that this is not new for him.  Work up in the ER included a head CT which showed no acute process, WBC of 12K and Cr of 1.4.   His CK was 732, and his UA showed TNTC WBCs.  Hospitalsit was asked to admit him for UTI, syncope, likely dehydration, and mild rhabdomyolysis.    ED Course:  See above.  Rewiew of Systems:   Past Medical History:  Diagnosis Date  . Glaucoma   . Memory loss   . Prostate cancer ([place]) 2004  . Tubular adenoma polyp of rectum 10/08/2013    Rewiew of Systems: Unable to   Past Surgical History:  Procedure Laterality Date  . FEMUR IM NAIL Left 07/05/2014   Procedure: INTRAMEDULLARY (IM) NAIL FEMORAL;  Surgeon: Marianna Payment, MD;  Location: [facility];  Service: Orthopedics;  Laterality: Left;  . prostate seed implant  2004     reports that he has never smoked. He has never used smokeless tobacco. He reports that he does not drink alcohol or use drugs.  No Known Allergies  Family History  Problem Relation Age of Onset  . Ovarian cancer Mother   . Heart disease Father   . Aneurysm Father      Prior to Admission medications   Medication Sig Start Date End Date Taking? Authorizing Provider  donepezil (ARICEPT) 10 MG tablet Take 1 tablet (10 mg total) by mouth at bedtime. 08/22/16   Yes Dennie Bible, NP  furosemide (LASIX) 20 MG tablet Take 20 mg by mouth daily as needed for fluid.  01/21/17  Yes [provider]  LUMIGAN 0.01 % SOLN Place 1 drop into both eyes at bedtime.  03/02/15  Yes [provider]  memantine (NAMENDA) 10 MG tablet Take 1 tablet (10 mg total) by mouth 2 (two) times daily. 08/22/16  Yes Dennie Bible, NP    Physical Exam: Vitals:   04/27/17 1931 04/27/17 2000 04/27/17 2030 04/27/17 2152  BP: 125/75 136/78 (!) 144/74 (!) 147/86  Pulse: (!) 52 (!) 52 (!) 44 (!) 56  Resp: 20 13 12 18   Temp: 97.4 F (36.3 C)     TempSrc: Oral     SpO2: 99% 100% 100% 100%  Weight: 65.8 kg (145 lb)         Constitutional: NAD, calm, comfortable Vitals:   04/27/17 1931 04/27/17 2000 04/27/17 2030 04/27/17 2152  BP: 125/75 136/78 (!) 144/74 (!) 147/86  Pulse: (!) 52 (!) 52 (!) 44 (!) 56  Resp: 20 13 12 18   Temp: 97.4 F (36.3 C)     TempSrc: Oral     SpO2: 99% 100% 100% 100%  Weight: 65.8 kg (145 lb)      Eyes: PERRL, lids  and conjunctivae normal ENMT: Mucous membranes are moist. Posterior pharynx clear of any exudate or lesions.Normal dentition.  Neck: normal, supple, no masses, no thyromegaly Respiratory: clear to auscultation bilaterally, no wheezing, no crackles. Normal respiratory effort. No accessory muscle use.  Cardiovascular: Regular rate and rhythm, no murmurs / rubs / gallops. No extremity edema. 2+ pedal pulses. No carotid bruits.  Abdomen: no tenderness, no masses palpated. No hepatosplenomegaly. Bowel sounds positive.  Musculoskeletal: no clubbing / cyanosis. No joint deformity upper and lower extremities. Good ROM, no contractures. Normal muscle tone.  Skin: no rashes, lesions, ulcers. No induration Neurologic: CN 2-12 grossly intact. Sensation intact, DTR normal. Strength 5/5 in all 4.  Psychiatric: Normal judgment and insight. Alert and oriented x 3. Normal mood.    Labs on Admission: I have personally  reviewed following labs and imaging studies CBC:  Recent Labs Lab 04/27/17 2035  WBC 12.2*  NEUTROABS 11.0*  HGB 12.9*  HCT 39.1  MCV 93.5  PLT 127   Basic Metabolic Panel:  Recent Labs Lab 04/27/17 2035  NA 141  K 4.4  CL 107  CO2 25  GLUCOSE 128*  BUN 20  CREATININE 1.38*  CALCIUM 9.1   GFR: Estimated Creatinine Clearance: 37.7 mL/min (A) (by C-G formula based on SCr of 1.38 mg/dL (H)). Liver Function Tests:  Recent Labs Lab 04/27/17 2035  AST 27  ALT 23  ALKPHOS 72  BILITOT 1.3*  PROT 6.8  ALBUMIN 3.7   Cardiac Enzymes:  Recent Labs Lab 04/27/17 2035  CKTOTAL 732*  TROPONINI <0.03   Urine analysis:    Component Value Date/Time   COLORURINE YELLOW 12/22/2007 [place] 12/22/2007 1456   LABSPEC 1.026 12/22/2007 1456   PHURINE 5.5 12/22/2007 1456   GLUCOSEU NEGATIVE 12/22/2007 [place] 12/22/2007 [place] 12/22/2007 [place] 12/22/2007 1456   PROTEINUR NEGATIVE 12/22/2007 1456   UROBILINOGEN 1.0 12/22/2007 1456   NITRITE NEGATIVE 12/22/2007 1456   LEUKOCYTESUR TRACE (A) 12/22/2007 1456   Radiological Exams on Admission: Dg Chest 1 View  Result Date: 04/27/2017 CLINICAL DATA:  Found unresponsive. EXAM: CHEST 1 VIEW COMPARISON:  Chest x-ray dated 12/25/2007. FINDINGS: Heart size is upper normal. Overall cardiomediastinal silhouette is stable in size and configuration. Lungs are clear. No pleural effusion or pneumothorax seen. Osseous and soft tissue structures about the chest are unremarkable. IMPRESSION: No active disease. Electronically Signed   By: Franki Cabot M.D.   On: 04/27/2017 20:50   Ct Head Wo Contrast  Result Date: 04/27/2017 CLINICAL DATA:  Found unresponsive earlier today. Low blood pressure. Partial resuscitation. EXAM: CT HEAD WITHOUT CONTRAST TECHNIQUE: Contiguous axial images were obtained from the base of the skull through the vertex without intravenous contrast.  COMPARISON:  03/30/2015 brain MR. FINDINGS: Brain: No evidence for acute stroke, acute hemorrhage, mass lesion, or extra-axial fluid. There is extreme atrophy, with hydrocephalus ex vacuo. Extensive involvement of the white matter by hypoattenuation, representing chronic microvascular ischemic change. There is an old RIGHT thalamus lacunar infarct. Vascular: No hyperdense vessel or unexpected calcification. Skull: Normal. Negative for fracture or focal lesion. Sinuses/Orbits: No acute findings or layering fluid. BILATERAL cataract extraction. Other: None.  Compared with prior brain MR, similar appearance. IMPRESSION: Severe atrophy and extensive white matter disease. Evidence for chronic cerebral infarction. No acute intracranial findings are evident. Electronically Signed   By: Staci Righter M.D.   On: 04/27/2017 20:22    EKG: Independently reviewed.  Assessment/Plan Principal Problem:   Syncope and collapse Active Problems:   Dehydration   Dementia   Rhabdomyolysis    PLAN:   Syncope:  Suspect dehydration, as it has been a hot day, and he has been on Lasix.  His BP was low when EMS arrived, and he responded nicely to IVF.  Will hold Lasix, continue with IV Lasix.  Leaning toward the right:  Per 2 sons, this is not new.  However, on exam, his right lower leg strength is weaker than the left.  Again, unclear if this is new.  Will obtain MRI of the brain to ascertain there is no new CVA.  Give ASA.   Dementia:  Stable. Continue meds.  Follow up with Dr Ronny Flurry or neurology.  Rhabdo:  IVF,  Follow Cr, check CPK in the morning.   DVT prophylaxis: sub Q heaprin.  Code Status: DNR.  Confirmed tonight.  Family Communication: both sons at bedside.  [name] is HCP. Disposition Plan: home.  Consults called: None.  Admission status: OBS.    Allisson Schindel MD FACP. [facility]  If 7PM-7AM, please contact night-coverage [email or web address] Password [facility]  04/27/2017, 10:22 PM

## 2017-04-28 ENCOUNTER — Inpatient Hospital Stay (HOSPITAL_COMMUNITY): Payer: Medicare Other

## 2017-04-28 DIAGNOSIS — I959 Hypotension, unspecified: Secondary | ICD-10-CM | POA: Diagnosis present

## 2017-04-28 DIAGNOSIS — N19 Unspecified kidney failure: Secondary | ICD-10-CM

## 2017-04-28 DIAGNOSIS — Z8546 Personal history of malignant neoplasm of prostate: Secondary | ICD-10-CM | POA: Diagnosis not present

## 2017-04-28 DIAGNOSIS — R55 Syncope and collapse: Secondary | ICD-10-CM | POA: Diagnosis present

## 2017-04-28 DIAGNOSIS — Z79899 Other long term (current) drug therapy: Secondary | ICD-10-CM | POA: Diagnosis not present

## 2017-04-28 DIAGNOSIS — F0280 Dementia in other diseases classified elsewhere without behavioral disturbance: Secondary | ICD-10-CM | POA: Diagnosis present

## 2017-04-28 DIAGNOSIS — G309 Alzheimer's disease, unspecified: Secondary | ICD-10-CM | POA: Diagnosis present

## 2017-04-28 DIAGNOSIS — I10 Essential (primary) hypertension: Secondary | ICD-10-CM | POA: Diagnosis present

## 2017-04-28 DIAGNOSIS — I638 Other cerebral infarction: Secondary | ICD-10-CM | POA: Diagnosis not present

## 2017-04-28 DIAGNOSIS — N390 Urinary tract infection, site not specified: Secondary | ICD-10-CM | POA: Diagnosis present

## 2017-04-28 DIAGNOSIS — T796XXA Traumatic ischemia of muscle, initial encounter: Secondary | ICD-10-CM | POA: Diagnosis present

## 2017-04-28 DIAGNOSIS — I639 Cerebral infarction, unspecified: Secondary | ICD-10-CM | POA: Diagnosis not present

## 2017-04-28 DIAGNOSIS — R4781 Slurred speech: Secondary | ICD-10-CM | POA: Diagnosis present

## 2017-04-28 DIAGNOSIS — Z8673 Personal history of transient ischemic attack (TIA), and cerebral infarction without residual deficits: Secondary | ICD-10-CM | POA: Diagnosis not present

## 2017-04-28 DIAGNOSIS — R4189 Other symptoms and signs involving cognitive functions and awareness: Secondary | ICD-10-CM | POA: Diagnosis not present

## 2017-04-28 DIAGNOSIS — M6282 Rhabdomyolysis: Secondary | ICD-10-CM | POA: Diagnosis not present

## 2017-04-28 DIAGNOSIS — N179 Acute kidney failure, unspecified: Secondary | ICD-10-CM | POA: Diagnosis present

## 2017-04-28 DIAGNOSIS — G9349 Other encephalopathy: Secondary | ICD-10-CM | POA: Diagnosis present

## 2017-04-28 DIAGNOSIS — I6300 Cerebral infarction due to thrombosis of unspecified precerebral artery: Secondary | ICD-10-CM | POA: Diagnosis not present

## 2017-04-28 DIAGNOSIS — E860 Dehydration: Secondary | ICD-10-CM | POA: Diagnosis present

## 2017-04-28 DIAGNOSIS — R001 Bradycardia, unspecified: Secondary | ICD-10-CM | POA: Diagnosis present

## 2017-04-28 DIAGNOSIS — Z7982 Long term (current) use of aspirin: Secondary | ICD-10-CM | POA: Diagnosis not present

## 2017-04-28 DIAGNOSIS — Z66 Do not resuscitate: Secondary | ICD-10-CM | POA: Diagnosis present

## 2017-04-28 DIAGNOSIS — F0150 Vascular dementia without behavioral disturbance: Secondary | ICD-10-CM | POA: Diagnosis not present

## 2017-04-28 DIAGNOSIS — I6340 Cerebral infarction due to embolism of unspecified cerebral artery: Secondary | ICD-10-CM | POA: Diagnosis present

## 2017-04-28 DIAGNOSIS — I739 Peripheral vascular disease, unspecified: Secondary | ICD-10-CM | POA: Diagnosis present

## 2017-04-28 LAB — CBC
HEMATOCRIT: 38.2 % — AB (ref 39.0–52.0)
HEMOGLOBIN: 12.8 g/dL — AB (ref 13.0–17.0)
MCH: 31 pg (ref 26.0–34.0)
MCHC: 33.5 g/dL (ref 30.0–36.0)
MCV: 92.5 fL (ref 78.0–100.0)
Platelets: 266 10*3/uL (ref 150–400)
RBC: 4.13 MIL/uL — AB (ref 4.22–5.81)
RDW: 14.7 % (ref 11.5–15.5)
WBC: 11.5 10*3/uL — ABNORMAL HIGH (ref 4.0–10.5)

## 2017-04-28 LAB — BASIC METABOLIC PANEL
ANION GAP: 9 (ref 5–15)
BUN: 16 mg/dL (ref 6–20)
CALCIUM: 8.8 mg/dL — AB (ref 8.9–10.3)
CO2: 23 mmol/L (ref 22–32)
Chloride: 109 mmol/L (ref 101–111)
Creatinine, Ser: 0.91 mg/dL (ref 0.61–1.24)
Glucose, Bld: 133 mg/dL — ABNORMAL HIGH (ref 65–99)
POTASSIUM: 3.6 mmol/L (ref 3.5–5.1)
Sodium: 141 mmol/L (ref 135–145)

## 2017-04-28 LAB — TSH: TSH: 0.965 u[IU]/mL (ref 0.350–4.500)

## 2017-04-28 LAB — CK: CK TOTAL: 1912 U/L — AB (ref 49–397)

## 2017-04-28 MED ORDER — ASPIRIN 325 MG PO TABS
325.0000 mg | ORAL_TABLET | Freq: Every day | ORAL | Status: DC
  Administered 2017-04-28 – 2017-05-02 (×5): 325 mg via ORAL
  Filled 2017-04-28 (×5): qty 1

## 2017-04-28 MED ORDER — DEXTROSE 5 % IV SOLN
1.0000 g | INTRAVENOUS | Status: DC
  Administered 2017-04-28 – 2017-05-01 (×4): 1 g via INTRAVENOUS
  Filled 2017-04-28 (×5): qty 10

## 2017-04-28 NOTE — Progress Notes (Addendum)
Patient ID: [name], male   DOB: 1932-10-25, 83 y.o.   MRN: [id number]  PROGRESS NOTE    [name]  YIA:[id number] DOB: Mar 12, 1932 DOA: 04/27/2017  PCP: Dione Housekeeper, MD   Brief Narrative:  83 year old male with dementia who presented to [facility] after he was found unresponsive on his porch at home. He was hypotensive on admission with SBP in 70's which has improved with IV fluids. CT head showed no acute intracranial findings but pt does have evidence of prior cerebral infarction. He was also found to have UTI.   Assessment & Plan:   Principal Problem:   Syncope and collapse - Unclear etiology, possible heat stroke or UTI versus CVA. Pt has evidence of chronic cerebral infarction on CT head - MRI brain pending - Obtain 2 D ECHO - Trop in WNL - PT eval is pending   Active Problems:   Dementia - Continue memantine and Aricept - Obtain PT eval    Rhabdomyolysis - Mild traumatic rhabdomyolysis after the fall -CK 732 - Repeat CK level in am    Acute renal failure - Cr normalized with IV fluids     UTI / Leukocytosis - Moderate leukocytes on UA - Started IV rocephin - Follow up urine cx results    DVT prophylaxis: SCD's Code Status: DNR/DNI Family Communication: no family at bedside in am Disposition Plan: home in am if syncopal work up completed, also awaiting MRI brain to be done    Consultants:   PT  Procedures:   ECHO  Antimicrobials:   Rocephin 6/24 -->   Subjective: No overnight events.  Objective: Vitals:   04/27/17 2200 04/27/17 2300 04/28/17 0014 04/28/17 0620  BP:  (!) 153/68  126/61  Pulse: (!) 55 (!) 108  (!) 51  Resp:  16  16  Temp:  98.2 F (36.8 C)  97.5 F (36.4 C)  TempSrc:  Oral  Oral  SpO2: 99% 97%  100%  Weight:   65.8 kg (145 lb)   Height:   5\' 10"  (1.778 m)     Intake/Output Summary (Last 24 hours) at 04/28/17 0930 Last data filed at 04/28/17 0348  Gross per 24 hour  Intake             1110 ml  Output                 0 ml  Net             1110 ml   Filed Weights   04/27/17 1931 04/28/17 0014  Weight: 65.8 kg (145 lb) 65.8 kg (145 lb)    Examination:  General exam: Appears calm and comfortable  Respiratory system: Clear to auscultation. Respiratory effort normal. Cardiovascular system: S1 & S2 heard, RRR. No JVD, murmurs, rubs, gallops or clicks. No pedal edema. Gastrointestinal system: Abdomen is nondistended, soft and nontender. No organomegaly or masses felt. Normal bowel sounds heard. Central nervous system: Alert and oriented. No focal neurological deficits. Extremities: Symmetric 5 x 5 power. Skin: No rashes, lesions or ulcers Psychiatry: Judgement and insight appear normal. Mood & affect appropriate.   Data Reviewed: I have personally reviewed following labs and imaging studies  CBC:  Recent Labs Lab 04/27/17 2035 04/28/17 0616  WBC 12.2* 11.5*  NEUTROABS 11.0*  --   HGB 12.9* 12.8*  HCT 39.1 38.2*  MCV 93.5 92.5  PLT 267 707   Basic Metabolic Panel:  Recent Labs Lab 04/27/17 2035 04/28/17 0616  NA 141 141  K 4.4 3.6  CL 107 109  CO2 25 23  GLUCOSE 128* 133*  BUN 20 16  CREATININE 1.38* 0.91  CALCIUM 9.1 8.8*   GFR: Estimated Creatinine Clearance: 57.2 mL/min (by C-G formula based on SCr of 0.91 mg/dL). Liver Function Tests:  Recent Labs Lab 04/27/17 2035  AST 27  ALT 23  ALKPHOS 72  BILITOT 1.3*  PROT 6.8  ALBUMIN 3.7   No results for input(s): LIPASE, AMYLASE in the last 168 hours. No results for input(s): AMMONIA in the last 168 hours. Coagulation Profile: No results for input(s): INR, PROTIME in the last 168 hours. Cardiac Enzymes:  Recent Labs Lab 04/27/17 2035 04/28/17 0616  CKTOTAL 732* 1,912*  TROPONINI <0.03  --    BNP (last 3 results) No results for input(s): PROBNP in the last 8760 hours. HbA1C: No results for input(s): HGBA1C in the last 72 hours. CBG: No results for input(s): GLUCAP in the last 168 hours. Lipid Profile: No  results for input(s): CHOL, HDL, LDLCALC, TRIG, CHOLHDL, LDLDIRECT in the last 72 hours. Thyroid Function Tests:  Recent Labs  04/28/17 0616  TSH 0.965   Anemia Panel: No results for input(s): VITAMINB12, FOLATE, FERRITIN, TIBC, IRON, RETICCTPCT in the last 72 hours. Urine analysis:    Component Value Date/Time   COLORURINE YELLOW 04/27/2017 1951   APPEARANCEUR HAZY (A) 04/27/2017 1951   LABSPEC 1.014 04/27/2017 1951   PHURINE 5.0 04/27/2017 1951   GLUCOSEU NEGATIVE 04/27/2017 1951   HGBUR MODERATE (A) 04/27/2017 1951   BILIRUBINUR NEGATIVE 04/27/2017 1951   KETONESUR 5 (A) 04/27/2017 1951   PROTEINUR 30 (A) 04/27/2017 1951   UROBILINOGEN 1.0 12/22/2007 1456   NITRITE NEGATIVE 04/27/2017 1951   LEUKOCYTESUR MODERATE (A) 04/27/2017 1951   Sepsis Labs: @LABRCNTIP (procalcitonin:4,lacticidven:4)   )No results found for this or any previous visit (from the past 240 hour(s)).    Radiology Studies: Dg Chest 1 View  Result Date: 04/27/2017 CLINICAL DATA:  Found unresponsive. EXAM: CHEST 1 VIEW COMPARISON:  Chest x-ray dated 12/25/2007. FINDINGS: Heart size is upper normal. Overall cardiomediastinal silhouette is stable in size and configuration. Lungs are clear. No pleural effusion or pneumothorax seen. Osseous and soft tissue structures about the chest are unremarkable. IMPRESSION: No active disease. Electronically Signed   By: Franki Cabot M.D.   On: 04/27/2017 20:50   Ct Head Wo Contrast  Result Date: 04/27/2017 CLINICAL DATA:  Found unresponsive earlier today. Low blood pressure. Partial resuscitation. EXAM: CT HEAD WITHOUT CONTRAST TECHNIQUE: Contiguous axial images were obtained from the base of the skull through the vertex without intravenous contrast. COMPARISON:  03/30/2015 brain MR. FINDINGS: Brain: No evidence for acute stroke, acute hemorrhage, mass lesion, or extra-axial fluid. There is extreme atrophy, with hydrocephalus ex vacuo. Extensive involvement of the white  matter by hypoattenuation, representing chronic microvascular ischemic change. There is an old RIGHT thalamus lacunar infarct. Vascular: No hyperdense vessel or unexpected calcification. Skull: Normal. Negative for fracture or focal lesion. Sinuses/Orbits: No acute findings or layering fluid. BILATERAL cataract extraction. Other: None.  Compared with prior brain MR, similar appearance. IMPRESSION: Severe atrophy and extensive white matter disease. Evidence for chronic cerebral infarction. No acute intracranial findings are evident. Electronically Signed   By: Staci Righter M.D.   On: 04/27/2017 20:22        Scheduled Meds: . aspirin  325 mg Oral Daily  . donepezil  10 mg Oral QHS  . latanoprost  1 drop Both Eyes QHS  . memantine  10  mg Oral BID  . sodium chloride flush  3 mL Intravenous Q12H   Continuous Infusions: . sodium chloride 100 mL/hr at 04/28/17 0646     LOS: 0 days    Time spent: 25 minutes  Greater than 50% of the time spent on counseling and coordinating the care.   Leisa Lenz, MD [facility] Pager [phone]  If 7PM-7AM, please contact night-coverage [email or web address] Password [email or web address] 04/28/2017, 9:30 AM

## 2017-04-28 NOTE — Progress Notes (Signed)
PT Cancellation Note  Patient Details Name: [name] MRN: [id number] DOB: [birth year]   Cancelled Treatment:    Reason Eval/Treat Not Completed: Patient at procedure or test/unavailable.   PT attempted treatment at 11:00; at 11:45 pt is still Not back in his room will see tomorrow.   Rayetta Humphrey, PT CLT [phone] 04/28/2017, 11:48 AM

## 2017-04-29 ENCOUNTER — Inpatient Hospital Stay (HOSPITAL_COMMUNITY): Payer: Medicare Other

## 2017-04-29 DIAGNOSIS — F0150 Vascular dementia without behavioral disturbance: Secondary | ICD-10-CM

## 2017-04-29 DIAGNOSIS — M6282 Rhabdomyolysis: Secondary | ICD-10-CM

## 2017-04-29 DIAGNOSIS — I638 Other cerebral infarction: Secondary | ICD-10-CM

## 2017-04-29 DIAGNOSIS — R55 Syncope and collapse: Secondary | ICD-10-CM

## 2017-04-29 DIAGNOSIS — I639 Cerebral infarction, unspecified: Secondary | ICD-10-CM

## 2017-04-29 LAB — CBC
HEMATOCRIT: 35.2 % — AB (ref 39.0–52.0)
HEMOGLOBIN: 11.8 g/dL — AB (ref 13.0–17.0)
MCH: 30.9 pg (ref 26.0–34.0)
MCHC: 33.5 g/dL (ref 30.0–36.0)
MCV: 92.1 fL (ref 78.0–100.0)
Platelets: 245 10*3/uL (ref 150–400)
RBC: 3.82 MIL/uL — AB (ref 4.22–5.81)
RDW: 14.8 % (ref 11.5–15.5)
WBC: 7.5 10*3/uL (ref 4.0–10.5)

## 2017-04-29 LAB — BASIC METABOLIC PANEL
ANION GAP: 8 (ref 5–15)
BUN: 11 mg/dL (ref 6–20)
CHLORIDE: 110 mmol/L (ref 101–111)
CO2: 21 mmol/L — AB (ref 22–32)
Calcium: 8.3 mg/dL — ABNORMAL LOW (ref 8.9–10.3)
Creatinine, Ser: 0.68 mg/dL (ref 0.61–1.24)
GFR calc non Af Amer: >60 mL/min (ref >60)
Glucose, Bld: 93 mg/dL (ref 65–99)
Potassium: 3.7 mmol/L (ref 3.5–5.1)
SODIUM: 139 mmol/L (ref 135–145)

## 2017-04-29 LAB — ECHOCARDIOGRAM COMPLETE
Height: 70 in
Weight: 2320 oz

## 2017-04-29 LAB — CK: CK TOTAL: 567 U/L — AB (ref 49–397)

## 2017-04-29 MED ORDER — STROKE: EARLY STAGES OF RECOVERY BOOK
Freq: Once | Status: DC
Start: 2017-04-29 — End: 2017-05-02
  Filled 2017-04-29: qty 1

## 2017-04-29 MED ORDER — ASPIRIN 325 MG PO TABS: 325.0000 mg | ORAL_TABLET | Freq: Every day | ORAL | Status: DC

## 2017-04-29 MED ORDER — ASPIRIN 300 MG RE SUPP: 300.0000 mg | Freq: Every day | RECTAL | Status: DC

## 2017-04-29 NOTE — Progress Notes (Signed)
Pt transferred to [facility] 20[facility] 19[facility] via [facility] at this time. Report called previously to Anderson Malta, RN at receiving unit. No distress upon transfer.

## 2017-04-29 NOTE — Progress Notes (Signed)
PT Cancellation Note  Patient Details Name: [name] MRN: [id number] DOB: [birth year]   Cancelled Treatment:    Reason Eval/Treat Not Completed: Other (comment). Per verbal from attending physician, pt is to be transferred out to [facility] today d/t new findings of multifocal acute infarct on MRI. PT consult will be performed after patient transfers. PT signing off.   11:10 AM, 04/29/17 Etta Grandchild, PT, DPT Physical Therapist - [place] [phone] (Office)    Buccola,Allan C 04/29/2017, 11:09 AM

## 2017-04-29 NOTE — Consult Note (Signed)
Requesting Physician: Dr. Charlies Silvers    Chief Complaint: Stroke on MRI  History obtained from:  Patient and Family  HPI:                                                                                                                                         [name] is an 83 y.o. male with a hx of vascular dementia who [facility] on Saturday when he was found slumped over by his neighbor. He appeared to be confused and a MRI was done which revealed scattered small acute infracts and was transferred to [facility] for further workup.    Past Medical History:  Diagnosis Date  . Glaucoma   . Memory loss   . Prostate cancer ([place]) 2004  . Tubular adenoma polyp of rectum 10/08/2013    Past Surgical History:  Procedure Laterality Date  . FEMUR IM NAIL Left 07/05/2014   Procedure: INTRAMEDULLARY (IM) NAIL FEMORAL;  Surgeon: Marianna Payment, MD;  Location: [place];  Service: Orthopedics;  Laterality: Left;  . prostate seed implant  2004    Family History  Problem Relation Age of Onset  . Ovarian cancer Mother   . Heart disease Father   . Aneurysm Father    Social History:  reports that he has never smoked. He has never used smokeless tobacco. He reports that he does not drink alcohol or use drugs.  Allergies: No Known Allergies  Medications:                                                                                                                           I have reviewed the patient's current medications.  ROS:  History obtained from the patient  General ROS: negative for - chills, fatigue, fever, night sweats, weight gain or weight loss Psychological ROS: negative for - behavioral disorder, hallucinations, memory difficulties, mood swings or suicidal ideation Ophthalmic ROS: negative for - blurry vision, double vision, eye pain or loss of  vision ENT ROS: negative for - epistaxis, nasal discharge, oral lesions, sore throat, tinnitus or vertigo Allergy and Immunology ROS: negative for - hives or itchy/watery eyes Hematological and Lymphatic ROS: negative for - bleeding problems, bruising or swollen lymph nodes Endocrine ROS: negative for - galactorrhea, hair pattern changes, polydipsia/polyuria or temperature intolerance Respiratory ROS: negative for - cough, hemoptysis, shortness of breath or wheezing Cardiovascular ROS: negative for - chest pain, dyspnea on exertion, edema or irregular heartbeat Gastrointestinal ROS: negative for - abdominal pain, diarrhea, hematemesis, nausea/vomiting or stool incontinence Genito-Urinary ROS: negative for - dysuria, hematuria, incontinence or urinary frequency/urgency Musculoskeletal ROS: negative for - joint swelling or muscular weakness Neurological ROS: as noted in HPI Dermatological ROS: negative for rash and skin lesion changes  Neurologic Examination:                                                                                                      Blood pressure 105/61, pulse (!) 47, temperature 97.8 F (36.6 C), temperature source Axillary, resp. rate 17, height 5\' 10"  (1.778 m), weight 65.8 kg (145 lb), SpO2 100 %.  HEENT-  Normocephalic, no lesions, without obvious abnormality.  Normal external eye and conjunctiva.  Normal TM's bilaterally.  Normal auditory canals and external ears. Normal external nose, mucus membranes and septum.  Normal pharynx. Cardiovascular- regular rate and rhythm, S1, S2 normal, no murmur, click, rub or gallop, pulses palpable throughout   Lungs- chest clear, no wheezing, rales, normal symmetric air entry, Heart exam - S1, S2 normal, no murmur, no gallop, rate regular Abdomen- soft, non-tender; bowel sounds normal; no masses,  no organomegaly   Neurological Examination Mental Status: Alert, oriented x 2, Speech mildly dysarthric without evidence of  aphasia.  Able to follow simple commands  Cranial Nerves: II: Discs flat bilaterally; Visual fields grossly normal,  III,IV, VI: ptosis not present, extra-ocular motions intact bilaterally, pupils equal, round, reactive to light and accommodation V,VII: R facial droop, facial light touch sensation normal bilaterally VIII: hearing normal bilaterally IX,X: uvula rises symmetrically XI: bilateral shoulder shrug XII: midline tongue extension Motor: Right : Upper extremity   5/5    Left:     Upper extremity   5/5  Lower extremity   3/5     Lower extremity   5/5 Tone and bulk:normal tone throughout; no atrophy noted Sensory: Pinprick and light touch intact throughout, bilaterally Cerebellar: normal finger-to-nose ataxia on the RUE        Lab Results: Basic Metabolic Panel:  Recent Labs Lab 04/27/17 2035 04/28/17 0616 04/29/17 0821  NA 141 141 139  K 4.4 3.6 3.7  CL 107 109 110  CO2 25 23 21*  GLUCOSE 128* 133* 93  BUN 20 16 11   CREATININE 1.38* 0.91 0.68  CALCIUM 9.1  8.8* 8.3*    Liver Function Tests:  Recent Labs Lab 04/27/17 2035  AST 27  ALT 23  ALKPHOS 72  BILITOT 1.3*  PROT 6.8  ALBUMIN 3.7   No results for input(s): LIPASE, AMYLASE in the last 168 hours. No results for input(s): AMMONIA in the last 168 hours.  CBC:  Recent Labs Lab 04/27/17 2035 04/28/17 0616 04/29/17 0821  WBC 12.2* 11.5* 7.5  NEUTROABS 11.0*  --   --   HGB 12.9* 12.8* 11.8*  HCT 39.1 38.2* 35.2*  MCV 93.5 92.5 92.1  PLT 267 266 245    Cardiac Enzymes:  Recent Labs Lab 04/27/17 2035 04/28/17 0616 04/29/17 0821  CKTOTAL 732* 1,912* 567*  TROPONINI <0.03  --   --     Lipid Panel: No results for input(s): CHOL, TRIG, HDL, CHOLHDL, VLDL, LDLCALC in the last 168 hours.  CBG: No results for input(s): GLUCAP in the last 168 hours.  Microbiology: Results for orders placed or performed during the hospital encounter of 04/27/17  Urine culture     Status: None  (Preliminary result)   Collection Time: 04/27/17  7:51 PM  Result Value Ref Range Status   Specimen Description URINE, CLEAN CATCH  Final   Special Requests NONE  Final   Culture   Final    CULTURE REINCUBATED FOR BETTER GROWTH Performed at [facility], [place] [address] [place], [place] [address]    Report Status PENDING  Incomplete    Coagulation Studies: No results for input(s): LABPROT, INR in the last 72 hours.  Imaging: Dg Chest 1 View  Result Date: 04/27/2017 CLINICAL DATA:  Found unresponsive. EXAM: CHEST 1 VIEW COMPARISON:  Chest x-ray dated 12/25/2007. FINDINGS: Heart size is upper normal. Overall cardiomediastinal silhouette is stable in size and configuration. Lungs are clear. No pleural effusion or pneumothorax seen. Osseous and soft tissue structures about the chest are unremarkable. IMPRESSION: No active disease. Electronically Signed   By: Franki Cabot M.D.   On: 04/27/2017 20:50   Ct Head Wo Contrast  Result Date: 04/27/2017 CLINICAL DATA:  Found unresponsive earlier today. Low blood pressure. Partial resuscitation. EXAM: CT HEAD WITHOUT CONTRAST TECHNIQUE: Contiguous axial images were obtained from the base of the skull through the vertex without intravenous contrast. COMPARISON:  03/30/2015 brain MR. FINDINGS: Brain: No evidence for acute stroke, acute hemorrhage, mass lesion, or extra-axial fluid. There is extreme atrophy, with hydrocephalus ex vacuo. Extensive involvement of the white matter by hypoattenuation, representing chronic microvascular ischemic change. There is an old RIGHT thalamus lacunar infarct. Vascular: No hyperdense vessel or unexpected calcification. Skull: Normal. Negative for fracture or focal lesion. Sinuses/Orbits: No acute findings or layering fluid. BILATERAL cataract extraction. Other: None.  Compared with prior brain MR, similar appearance. IMPRESSION: Severe atrophy and extensive white matter disease. Evidence for chronic cerebral  infarction. No acute intracranial findings are evident. Electronically Signed   By: Staci Righter M.D.   On: 04/27/2017 20:22   Mr Brain Wo Contrast  Result Date: 04/28/2017 CLINICAL DATA:  Found unresponsive. Syncope and collapse. Prior stroke. EXAM: MRI HEAD WITHOUT CONTRAST TECHNIQUE: Multiplanar, multiecho pulse sequences of the brain and surrounding structures were obtained without intravenous contrast. COMPARISON:  Head CT 04/27/2017 and MRI 03/30/2015 FINDINGS: Brain: Small acute infarcts are present in the right medulla and posteroinferior left cerebellar hemisphere. There are also punctate acute cortical infarcts in the high posterior frontal lobes bilaterally. Scattered chronic cerebral and cerebellar microhemorrhages are again seen. A small chronic right  occipital cortical infarct is new or larger than on the prior MRI. There also may be a tiny left occipital chronic cortical infarct. Patchy to confluent cerebral white matter T2 hyperintensities are similar to the prior MRI and nonspecific but compatible with extensive chronic small vessel ischemic disease. There are numerous small chronic infarcts in the cerebellum bilaterally. Chronic lacunar infarcts are also again seen in the anterior left centrum semiovale and right thalamus. There is marked cerebral atrophy. No mass, midline shift, or extra-axial fluid collection is seen. Vascular: Unchanged poor visualization of the distal right vertebral artery, possibly occluded. Dominant appearing left vertebral artery with grossly preserved vascular flow voids elsewhere. Skull and upper cervical spine: Unremarkable bone marrow signal. Sinuses/Orbits: Bilateral cataract extraction. Paranasal sinuses and mastoid air cells are clear. Other: None. IMPRESSION: 1. 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 2. Advanced cerebral atrophy and chronic small vessel ischemic disease with chronic infarcts as above. Electronically  Signed   By: Logan Bores M.D.   On: 04/28/2017 12:13   US Carotid Bilateral (at [facility])  Result Date: 04/29/2017 CLINICAL DATA:  Stroke.  Syncopal episode. EXAM: BILATERAL CAROTID DUPLEX ULTRASOUND TECHNIQUE: Pearline Cables scale imaging, color Doppler and duplex ultrasound were performed of bilateral carotid and vertebral arteries in the neck. COMPARISON:  None. FINDINGS: Criteria: Quantification of carotid stenosis is based on velocity parameters that correlate the residual internal carotid diameter with NASCET-based stenosis levels, using the diameter of the distal internal carotid lumen as the denominator for stenosis measurement. The following velocity measurements were obtained: RIGHT ICA:  45/6 cm/sec CCA:  856/31 cm/sec SYSTOLIC ICA/CCA RATIO:  0.4 DIASTOLIC ICA/CCA RATIO:  0.4 ECA:  105 cm/sec LEFT ICA:  78/6 cm/sec CCA:  497/02 cm/sec SYSTOLIC ICA/CCA RATIO:  0.7 DIASTOLIC ICA/CCA RATIO:  0.6 ECA:  102 cm/sec RIGHT CAROTID ARTERY: There is a very minimal amount of intimal thickening within the right carotid bulb (image 17)), extending to involve the origin and proximal aspects of the right internal carotid artery (image 25), not resulting in elevated peak systolic velocities within the interrogated course the right internal carotid artery to suggest a hemodynamically significant stenosis. RIGHT VERTEBRAL ARTERY:  Antegrade flow LEFT CAROTID ARTERY: There is a moderate amount of eccentric mixed echogenic plaque within the left carotid bulb (images 49 and 51), not resulting in elevated peak systolic velocities within the interrogated course of the left internal carotid artery to suggest a hemodynamically significant stenosis. LEFT VERTEBRAL ARTERY:  Antegrade flow IMPRESSION: 1. Moderate amount of left-sided atherosclerotic plaque, not resulting in a hemodynamically significant stenosis. 2. Minimal amount of right-sided intimal wall thickening, not resulting in a hemodynamically significant stenosis.  Electronically Signed   By: Sandi Mariscal M.D.   On: 04/29/2017 11:56       04/29/2017, 6:01 PM   Assessment: 83 y.o. male with a hx of vascular dementia who [facility] on Saturday when he was found slumped over by his neighbor. He appeared to be confused and a MRI was done which revealed scattered small acute infracts and was transferred to [facility] for further workup.  1. HgbA1c, fasting lipid panel 2. MRI of the brain without contrast- completed     CTA head and neck 3. PT consult, OT consult, Speech consult 4. Echocardiogram- completed 5. Carotid dopplers- completed 6. Prophylactic therapy-Antiplatelet med: Aspirin - dose 325mg  7. Risk factor modification 8. Telemetry monitoring 9. Frequent neuro checks 10 NPO until passes stroke swallow screen  11 please page stroke NP  Or  PA  Or MD from 8am -4 pm  as this patient from this time will be  followed by the stroke.   You can look them up on [email or web address]  Password [email or web address]

## 2017-04-29 NOTE — Progress Notes (Signed)
Patient ID: [name], male   DOB: 10/12/1932, 83 y.o.   MRN: [id number]  PROGRESS NOTE    [name]  GLO:[id number] DOB: [birth year] DOA: 04/27/2017  PCP: Dione Housekeeper, MD   Brief Narrative:  83 year old male with dementia who presented to [facility] after he was found unresponsive on his porch at home. He was hypotensive on admission with SBP in 70's which has improved with IV fluids. CT head showed no acute intracranial findings but pt does have evidence of prior cerebral infarction. He was also found to have UTI. Patient also had MRI of the brain which showed small acute infarcts in the medulla, left cerebellum and posterior frontal lobes.   Assessment & Plan:   Principal Problem:   Syncope and collapse - Likely due to new stroke as well as history of previous stroke Stroke work up initiated:  - Aspirin daily - MRI brain - small acute infarcts in the medulla, left cerebellum and posterior frontal lobes, advanced cerebral atrophy and chronic small vessel ischemic disease with chronic infarcts - 2D ECHO - pending; troponin was WNL - Carotid doppler - pending  - HgbA1c, Lipid panel - pending. LDL goal < 100. Patient not on statin therapy  - Diet: swallow eval pending  - Therapy: PT/OT Other Stroke Risk Factors : Advanced age, history of CVA  Active Problems:   Dementia without behavioral disturbance  - Continue aricept and namenda  - PT evel pending     Rhabdomyolysis - Patient with mild traumatic rhabdomyolysis after the fall - CK is 567 this morning    Acute renal failure - Cr improved with IV fluids     UTI / Leukocytosis - Continue rocephin - Follow up urine cx results    DVT prophylaxis: SCDs Code Status: DNR/DNI Family Communication: No family at the bedside Disposition Plan: I spoke with neurology who recommended transfer to [facility] for completion of stroke work up. I tried to reach patient's 2 brothers and a son but no one answered the phone. I wanted to  inform them of patient's MRI results and to let them know that he needs to be transferred to [facility] and to get their approval for transfer. Will hold transfer until we are able to reach family who can give [place] permission to transfer the patient to [facility]   Consultants:   Neurology   Procedures:   ECHO 6/24 - pending   Antimicrobials:   Rocephin 6/24 -->    Subjective: No overnight events.  Objective: Vitals:   04/28/17 0620 04/28/17 1443 04/28/17 2235 04/29/17 0517  BP: 126/61 (!) 130/56 (!) 129/53 120/67  Pulse: (!) 51 (!) 41 (!) 40 (!) 47  Resp: 16 16 16 14   Temp: 97.5 F (36.4 C) 97.5 F (36.4 C)    TempSrc: Oral Oral    SpO2: 100% 100% 100% 100%  Weight:      Height:        Intake/Output Summary (Last 24 hours) at 04/29/17 0931 Last data filed at 04/29/17 0522  Gross per 24 hour  Intake          2836.67 ml  Output             1150 ml  Net          1686.67 ml   Filed Weights   04/27/17 1931 04/28/17 0014  Weight: 65.8 kg (145 lb) 65.8 kg (145 lb)    Examination:  Physical Exam  Constitutional: Appears in no  distress  Neck: Normal ROM. Neck supple. No JVD. CVS: Rate controlled, S1/S2 + Pulmonary: Effort and breath sounds normal, no stridor, rhonchi, wheezes, rales.  Abdominal: Soft. BS +,  no distension, tenderness, rebound or guarding.  Musculoskeletal: No edema and no tenderness.  Neuro: Alert. Slightly disoriented  Skin: Skin is warm and dry. No rash noted.  Psychiatric: Normal mood and affect. Behavior normal.     Data Reviewed: I have personally reviewed following labs and imaging studies  CBC:  Recent Labs Lab 04/27/17 2035 04/28/17 0616 04/29/17 0821  WBC 12.2* 11.5* 7.5  NEUTROABS 11.0*  --   --   HGB 12.9* 12.8* 11.8*  HCT 39.1 38.2* 35.2*  MCV 93.5 92.5 92.1  PLT 267 266 782   Basic Metabolic Panel:  Recent Labs Lab 04/27/17 2035 04/28/17 0616 04/29/17 0821  NA 141 141 139  K 4.4 3.6 3.7  CL 107 109 110  CO2 25  23 21*  GLUCOSE 128* 133* 93  BUN 20 16 11   CREATININE 1.38* 0.91 0.68  CALCIUM 9.1 8.8* 8.3*   GFR: Estimated Creatinine Clearance: 65.1 mL/min (by C-G formula based on SCr of 0.68 mg/dL). Liver Function Tests:  Recent Labs Lab 04/27/17 2035  AST 27  ALT 23  ALKPHOS 72  BILITOT 1.3*  PROT 6.8  ALBUMIN 3.7   No results for input(s): LIPASE, AMYLASE in the last 168 hours. No results for input(s): AMMONIA in the last 168 hours. Coagulation Profile: No results for input(s): INR, PROTIME in the last 168 hours. Cardiac Enzymes:  Recent Labs Lab 04/27/17 2035 04/28/17 0616 04/29/17 0821  CKTOTAL 732* 1,912* 567*  TROPONINI <0.03  --   --    BNP (last 3 results) No results for input(s): PROBNP in the last 8760 hours. HbA1C: No results for input(s): HGBA1C in the last 72 hours. CBG: No results for input(s): GLUCAP in the last 168 hours. Lipid Profile: No results for input(s): CHOL, HDL, LDLCALC, TRIG, CHOLHDL, LDLDIRECT in the last 72 hours. Thyroid Function Tests:  Recent Labs  04/28/17 0616  TSH 0.965   Anemia Panel: No results for input(s): VITAMINB12, FOLATE, FERRITIN, TIBC, IRON, RETICCTPCT in the last 72 hours. Urine analysis:    Component Value Date/Time   COLORURINE YELLOW 04/27/2017 1951   APPEARANCEUR HAZY (A) 04/27/2017 1951   LABSPEC 1.014 04/27/2017 1951   PHURINE 5.0 04/27/2017 1951   GLUCOSEU NEGATIVE 04/27/2017 1951   HGBUR MODERATE (A) 04/27/2017 1951   BILIRUBINUR NEGATIVE 04/27/2017 1951   KETONESUR 5 (A) 04/27/2017 1951   PROTEINUR 30 (A) 04/27/2017 1951   UROBILINOGEN 1.0 12/22/2007 1456   NITRITE NEGATIVE 04/27/2017 1951   LEUKOCYTESUR MODERATE (A) 04/27/2017 1951   Sepsis Labs: @LABRCNTIP (procalcitonin:4,lacticidven:4)   )No results found for this or any previous visit (from the past 240 hour(s)).    Radiology Studies: Dg Chest 1 View  Result Date: 04/27/2017 CLINICAL DATA:  Found unresponsive. EXAM: CHEST 1 VIEW COMPARISON:   Chest x-ray dated 12/25/2007. FINDINGS: Heart size is upper normal. Overall cardiomediastinal silhouette is stable in size and configuration. Lungs are clear. No pleural effusion or pneumothorax seen. Osseous and soft tissue structures about the chest are unremarkable. IMPRESSION: No active disease. Electronically Signed   By: Franki Cabot M.D.   On: 04/27/2017 20:50   Ct Head Wo Contrast  Result Date: 04/27/2017 CLINICAL DATA:  Found unresponsive earlier today. Low blood pressure. Partial resuscitation. EXAM: CT HEAD WITHOUT CONTRAST TECHNIQUE: Contiguous axial images were obtained from the base of  the skull through the vertex without intravenous contrast. COMPARISON:  03/30/2015 brain MR. FINDINGS: Brain: No evidence for acute stroke, acute hemorrhage, mass lesion, or extra-axial fluid. There is extreme atrophy, with hydrocephalus ex vacuo. Extensive involvement of the white matter by hypoattenuation, representing chronic microvascular ischemic change. There is an old RIGHT thalamus lacunar infarct. Vascular: No hyperdense vessel or unexpected calcification. Skull: Normal. Negative for fracture or focal lesion. Sinuses/Orbits: No acute findings or layering fluid. BILATERAL cataract extraction. Other: None.  Compared with prior brain MR, similar appearance. IMPRESSION: Severe atrophy and extensive white matter disease. Evidence for chronic cerebral infarction. No acute intracranial findings are evident. Electronically Signed   By: Staci Righter M.D.   On: 04/27/2017 20:22   Mr Brain Wo Contrast  Result Date: 04/28/2017 CLINICAL DATA:  Found unresponsive. Syncope and collapse. Prior stroke. EXAM: MRI HEAD WITHOUT CONTRAST TECHNIQUE: Multiplanar, multiecho pulse sequences of the brain and surrounding structures were obtained without intravenous contrast. COMPARISON:  Head CT 04/27/2017 and MRI 03/30/2015 FINDINGS: Brain: Small acute infarcts are present in the right medulla and posteroinferior left  cerebellar hemisphere. There are also punctate acute cortical infarcts in the high posterior frontal lobes bilaterally. Scattered chronic cerebral and cerebellar microhemorrhages are again seen. A small chronic right occipital cortical infarct is new or larger than on the prior MRI. There also may be a tiny left occipital chronic cortical infarct. Patchy to confluent cerebral white matter T2 hyperintensities are similar to the prior MRI and nonspecific but compatible with extensive chronic small vessel ischemic disease. There are numerous small chronic infarcts in the cerebellum bilaterally. Chronic lacunar infarcts are also again seen in the anterior left centrum semiovale and right thalamus. There is marked cerebral atrophy. No mass, midline shift, or extra-axial fluid collection is seen. Vascular: Unchanged poor visualization of the distal right vertebral artery, possibly occluded. Dominant appearing left vertebral artery with grossly preserved vascular flow voids elsewhere. Skull and upper cervical spine: Unremarkable bone marrow signal. Sinuses/Orbits: Bilateral cataract extraction. Paranasal sinuses and mastoid air cells are clear. Other: None. IMPRESSION: 1. 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 2. Advanced cerebral atrophy and chronic small vessel ischemic disease with chronic infarcts as above. Electronically Signed   By: Logan Bores M.D.   On: 04/28/2017 12:13      Scheduled Meds: .  stroke: mapping our early stages of recovery book   Does not apply Once  . aspirin  325 mg Oral Daily  . donepezil  10 mg Oral QHS  . latanoprost  1 drop Both Eyes QHS  . memantine  10 mg Oral BID  . sodium chloride flush  3 mL Intravenous Q12H   Continuous Infusions: . sodium chloride 100 mL/hr at 04/28/17 1613  . cefTRIAXone (ROCEPHIN)  IV 1 g (04/29/17 0840)     LOS: 1 day    Time spent: 35 minutes  Greater than 50% of the time spent on counseling and  coordinating the care. I spoke with neurology for patient's finding of new stroke on MRI brain.   Leisa Lenz, MD [facility] Pager [phone]  If 7PM-7AM, please contact night-coverage [email or web address] Password [email or web address] 04/29/2017, 9:31 AM

## 2017-04-29 NOTE — Progress Notes (Signed)
Pt. Received from [facility], confused, MAEW, pulse 30s-40s, notified Dr. Charlies Silvers and [facility] admissions of the patient's arrival. Dr. Charlies Silvers aware of pulse, no orders at this time.

## 2017-04-29 NOTE — Progress Notes (Signed)
*  PRELIMINARY RESULTS* Echocardiogram 2D Echocardiogram has been performed.  Leavy Cella 04/29/2017, 2:15 PM

## 2017-04-30 ENCOUNTER — Encounter (HOSPITAL_COMMUNITY): Payer: Self-pay | Admitting: Radiology

## 2017-04-30 ENCOUNTER — Inpatient Hospital Stay (HOSPITAL_COMMUNITY): Payer: Medicare Other

## 2017-04-30 DIAGNOSIS — I639 Cerebral infarction, unspecified: Secondary | ICD-10-CM | POA: Diagnosis present

## 2017-04-30 DIAGNOSIS — I6300 Cerebral infarction due to thrombosis of unspecified precerebral artery: Secondary | ICD-10-CM

## 2017-04-30 DIAGNOSIS — N390 Urinary tract infection, site not specified: Secondary | ICD-10-CM

## 2017-04-30 LAB — LIPID PANEL
Cholesterol: 139 mg/dL (ref 0–200)
HDL: 63 mg/dL (ref >40)
LDL Cholesterol: 66 mg/dL (ref 0–99)
TRIGLYCERIDES: 51 mg/dL (ref <150)
Total CHOL/HDL Ratio: 2.2 RATIO
VLDL: 10 mg/dL (ref 0–40)

## 2017-04-30 LAB — CBC
HCT: 34.8 % — ABNORMAL LOW (ref 39.0–52.0)
Hemoglobin: 11.4 g/dL — ABNORMAL LOW (ref 13.0–17.0)
MCH: 29.9 pg (ref 26.0–34.0)
MCHC: 32.8 g/dL (ref 30.0–36.0)
MCV: 91.3 fL (ref 78.0–100.0)
Platelets: 236 10*3/uL (ref 150–400)
RBC: 3.81 MIL/uL — AB (ref 4.22–5.81)
RDW: 14.6 % (ref 11.5–15.5)
WBC: 9.3 10*3/uL (ref 4.0–10.5)

## 2017-04-30 LAB — URINE CULTURE

## 2017-04-30 MED ORDER — IOPAMIDOL (ISOVUE-370) INJECTION 76%
INTRAVENOUS | Status: AC
Start: 2017-04-30 — End: 2017-04-30
  Administered 2017-04-30: 50 mL
  Filled 2017-04-30: qty 50

## 2017-04-30 MED ORDER — LORAZEPAM 2 MG/ML IJ SOLN
INTRAMUSCULAR | Status: AC
  Filled 2017-04-30: qty 1

## 2017-04-30 MED ORDER — LORAZEPAM 2 MG/ML IJ SOLN
1.0000 mg | Freq: Once | INTRAMUSCULAR | Status: AC
  Administered 2017-04-30: 1 mg via INTRAVENOUS

## 2017-04-30 NOTE — Progress Notes (Signed)
[facility]                                                                              Patient Demographics  [name], is a 83 y.o. male, DOB - 1932/03/12, ZOX:[id number]  Admit date - 04/27/2017   Admitting Physician Orvan Falconer, MD  Outpatient Primary MD for the patient is Dione Housekeeper, MD  Outpatient specialists:   LOS - 2  days   Medical records reviewed and are as summarized below:    Chief Complaint  Patient presents with  . Heat Exposure  . Altered Mental Status       Brief summary   83 year old male with dementia who presented to [facility] after he was found unresponsive on his porch at home. He was hypotensive on admission with SBP in 70's which has improved with IV fluids. CT head showed no acute intracranial findings but pt does have evidence of prior cerebral infarction. He was also found to have UTI. Patient also had MRI of the brain which showed small acute infarcts in the medulla, left cerebellum and posterior frontal lobes. Patient was transferred from [facility] to [facility] for stroke workup  Assessment & Plan    Principal Problem:   CVA (cerebral vascular accident) (HCC)Presented as acute encephalopathy and syncopal episode - MRI of the brain showed small acute infarcts in the medulla, left cerebellum and posterior frontal lobes, advanced cerebral atrophy and chronic small vessel ischemic disease with chronic infarcts - Patient was transferred to [facility] for stroke workup, neurology consulted - CT angiogram head and neck pending - 2-D echo showed EF of 60-65%, no regional wall motion abnormalities -Carotid Dopplers showed moderate amount of left-sided death restarted block, minimal right-sided intimal wall thickening, no significant stenosis on both sides  -LDL 66  -On aspirin 325 mg daily  - PTOT evaluation pending, lives alone, was found by neighbor unresponsive, will likely need higher level of care    Active  Problems:    Dehydration - Continue gentle hydration, tolerating regular diet     Dementia - Continue Aricept     Rhabdomyolysis - Ck  improving, continue gentle hydration     UTI (urinary tract infection) - Continue IV Rocephin, urine culture showed multiple species  Acute kidney injury likely due to dehydration, rhabdomyolysis  - Resolved, continue gentle hydration    Code Status: dnr DVT Prophylaxis:   SCD's Family Communication: No family member at the bedside   Disposition Plan: *PTOT evaluation pending, will likely need higher level of care, lives alone   Time Spent in minutes   25 minutes   Procedures:  MRI brain 2-D echo:   EF of 60-65%, no regional wall motion abnormalities Carotid Dopplers  Consultants    Neurology  Antimicrobials  IV Rocephin 6/24 >   Medications  Scheduled Meds: .  stroke: mapping our early stages of recovery book   Does not apply Once  . aspirin  325 mg Oral Daily  . donepezil  10 mg Oral QHS  . latanoprost  1 drop Both Eyes QHS  . memantine  10 mg Oral BID  .  sodium chloride flush  3 mL Intravenous Q12H   Continuous Infusions: . sodium chloride 100 mL/hr at 04/29/17 2034  . cefTRIAXone (ROCEPHIN)  IV 1 g (04/30/17 0956)   PRN Meds:.   Antibiotics   Anti-infectives    Start     Dose/Rate Route Frequency Ordered Stop   04/28/17 0945  cefTRIAXone (ROCEPHIN) 1 g in dextrose 5 % 50 mL IVPB     1 g 100 mL/hr over 30 Minutes Intravenous Every 24 hours 04/28/17 0931          Subjective:   [name] was seen and examined today.  No acute events overnight. States he is not feeling well, did not sleep well. No fevers or chills. No headaches. Patient denies dizziness, chest pain, shortness of breath, abdominal pain, N/V/D/C, new weakness, numbess, tingling.   Objective:   Vitals:   04/29/17 2225 04/30/17 0051 04/30/17 0503 04/30/17 1028  BP: 121/60 118/63 (!) 141/68 (!) 140/53  Pulse: (!) 45 (!) 49 (!) 55 (!) 43    Resp: 16 18 18 18   Temp: 97.1 F (36.2 C) 97.6 F (36.4 C) 97.3 F (36.3 C) 97.6 F (36.4 C)  TempSrc: Oral Oral Oral Oral  SpO2: 100% 100% 100% 100%  Weight:      Height:        Intake/Output Summary (Last 24 hours) at 04/30/17 1137 Last data filed at 04/30/17 0900  Gross per 24 hour  Intake          1743.33 ml  Output              800 ml  Net           943.33 ml     Wt Readings from Last 3 Encounters:  04/28/17 65.8 kg (145 lb)  02/21/17 66 kg (145 lb 8 oz)  08/22/16 60.3 kg (133 lb)     Exam  General: Alert and oriented x 2, NAD  Eyes: PERRLA, EOMI, Anicteric Sclera,  HEENT:  Atraumatic, normocephalic, normal oropharynx  Cardiovascular: S1 S2 auscultated, no rubs, murmurs or gallops. Regular rate and rhythm.  Respiratory: Clear to auscultation bilaterally, no wheezing, rales or rhonchi  Gastrointestinal: Soft, nontender, nondistended, + bowel sounds  Ext: no pedal edema bilaterally  Neuro: AAOx2, Cr N's II- XII. Strength 5/5 upper and lower extremities bilaterally, speech clear, sensations grossly intact  Musculoskeletal: No digital cyanosis, clubbing  Skin: No rashes  Psych: Normal affect and demeanor, alert and oriented x2   Data Reviewed:  I have personally reviewed following labs and imaging studies  Micro Results Recent Results (from the past 240 hour(s))  Urine culture     Status: Abnormal   Collection Time: 04/27/17  7:51 PM  Result Value Ref Range Status   Specimen Description URINE, CLEAN CATCH  Final   Special Requests NONE  Final   Culture MULTIPLE SPECIES PRESENT, SUGGEST RECOLLECTION (A)  Final   Report Status 04/30/2017 FINAL  Final    Radiology Reports Dg Chest 1 View  Result Date: 04/27/2017 CLINICAL DATA:  Found unresponsive. EXAM: CHEST 1 VIEW COMPARISON:  Chest x-ray dated 12/25/2007. FINDINGS: Heart size is upper normal. Overall cardiomediastinal silhouette is stable in size and configuration. Lungs are clear. No pleural  effusion or pneumothorax seen. Osseous and soft tissue structures about the chest are unremarkable. IMPRESSION: No active disease. Electronically Signed   By: Franki Cabot M.D.   On: 04/27/2017 20:50   Ct Head Wo Contrast  Result Date: 04/27/2017 CLINICAL DATA:  Found unresponsive earlier today. Low blood pressure. Partial resuscitation. EXAM: CT HEAD WITHOUT CONTRAST TECHNIQUE: Contiguous axial images were obtained from the base of the skull through the vertex without intravenous contrast. COMPARISON:  03/30/2015 brain MR. FINDINGS: Brain: No evidence for acute stroke, acute hemorrhage, mass lesion, or extra-axial fluid. There is extreme atrophy, with hydrocephalus ex vacuo. Extensive involvement of the white matter by hypoattenuation, representing chronic microvascular ischemic change. There is an old RIGHT thalamus lacunar infarct. Vascular: No hyperdense vessel or unexpected calcification. Skull: Normal. Negative for fracture or focal lesion. Sinuses/Orbits: No acute findings or layering fluid. BILATERAL cataract extraction. Other: None.  Compared with prior brain MR, similar appearance. IMPRESSION: Severe atrophy and extensive white matter disease. Evidence for chronic cerebral infarction. No acute intracranial findings are evident. Electronically Signed   By: Staci Righter M.D.   On: 04/27/2017 20:22   Mr Brain Wo Contrast  Result Date: 04/28/2017 CLINICAL DATA:  Found unresponsive. Syncope and collapse. Prior stroke. EXAM: MRI HEAD WITHOUT CONTRAST TECHNIQUE: Multiplanar, multiecho pulse sequences of the brain and surrounding structures were obtained without intravenous contrast. COMPARISON:  Head CT 04/27/2017 and MRI 03/30/2015 FINDINGS: Brain: Small acute infarcts are present in the right medulla and posteroinferior left cerebellar hemisphere. There are also punctate acute cortical infarcts in the high posterior frontal lobes bilaterally. Scattered chronic cerebral and cerebellar microhemorrhages  are again seen. A small chronic right occipital cortical infarct is new or larger than on the prior MRI. There also may be a tiny left occipital chronic cortical infarct. Patchy to confluent cerebral white matter T2 hyperintensities are similar to the prior MRI and nonspecific but compatible with extensive chronic small vessel ischemic disease. There are numerous small chronic infarcts in the cerebellum bilaterally. Chronic lacunar infarcts are also again seen in the anterior left centrum semiovale and right thalamus. There is marked cerebral atrophy. No mass, midline shift, or extra-axial fluid collection is seen. Vascular: Unchanged poor visualization of the distal right vertebral artery, possibly occluded. Dominant appearing left vertebral artery with grossly preserved vascular flow voids elsewhere. Skull and upper cervical spine: Unremarkable bone marrow signal. Sinuses/Orbits: Bilateral cataract extraction. Paranasal sinuses and mastoid air cells are clear. Other: None. IMPRESSION: 1. 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 2. Advanced cerebral atrophy and chronic small vessel ischemic disease with chronic infarcts as above. Electronically Signed   By: Logan Bores M.D.   On: 04/28/2017 12:13   US Carotid Bilateral (at [facility] Only)  Result Date: 04/29/2017 CLINICAL DATA:  Stroke.  Syncopal episode. EXAM: BILATERAL CAROTID DUPLEX ULTRASOUND TECHNIQUE: Pearline Cables scale imaging, color Doppler and duplex ultrasound were performed of bilateral carotid and vertebral arteries in the neck. COMPARISON:  None. FINDINGS: Criteria: Quantification of carotid stenosis is based on velocity parameters that correlate the residual internal carotid diameter with NASCET-based stenosis levels, using the diameter of the distal internal carotid lumen as the denominator for stenosis measurement. The following velocity measurements were obtained: RIGHT ICA:  45/6 cm/sec CCA:  867/61 cm/sec  SYSTOLIC ICA/CCA RATIO:  0.4 DIASTOLIC ICA/CCA RATIO:  0.4 ECA:  105 cm/sec LEFT ICA:  78/6 cm/sec CCA:  950/93 cm/sec SYSTOLIC ICA/CCA RATIO:  0.7 DIASTOLIC ICA/CCA RATIO:  0.6 ECA:  102 cm/sec RIGHT CAROTID ARTERY: There is a very minimal amount of intimal thickening within the right carotid bulb (image 17)), extending to involve the origin and proximal aspects of the right internal carotid artery (image 25), not resulting in elevated peak systolic  velocities within the interrogated course the right internal carotid artery to suggest a hemodynamically significant stenosis. RIGHT VERTEBRAL ARTERY:  Antegrade flow LEFT CAROTID ARTERY: There is a moderate amount of eccentric mixed echogenic plaque within the left carotid bulb (images 49 and 51), not resulting in elevated peak systolic velocities within the interrogated course of the left internal carotid artery to suggest a hemodynamically significant stenosis. LEFT VERTEBRAL ARTERY:  Antegrade flow IMPRESSION: 1. Moderate amount of left-sided atherosclerotic plaque, not resulting in a hemodynamically significant stenosis. 2. Minimal amount of right-sided intimal wall thickening, not resulting in a hemodynamically significant stenosis. Electronically Signed   By: Sandi Mariscal M.D.   On: 04/29/2017 11:56    Lab Data:  CBC:  Recent Labs Lab 04/27/17 2035 04/28/17 0616 04/29/17 0821 04/30/17 0508  WBC 12.2* 11.5* 7.5 9.3  NEUTROABS 11.0*  --   --   --   HGB 12.9* 12.8* 11.8* 11.4*  HCT 39.1 38.2* 35.2* 34.8*  MCV 93.5 92.5 92.1 91.3  PLT 267 266 245 833   Basic Metabolic Panel:  Recent Labs Lab 04/27/17 2035 04/28/17 0616 04/29/17 0821  NA 141 141 139  K 4.4 3.6 3.7  CL 107 109 110  CO2 25 23 21*  GLUCOSE 128* 133* 93  BUN 20 16 11   CREATININE 1.38* 0.91 0.68  CALCIUM 9.1 8.8* 8.3*   GFR: Estimated Creatinine Clearance: 65.1 mL/min (by C-G formula based on SCr of 0.68 mg/dL). Liver Function Tests:  Recent Labs Lab 04/27/17 2035    AST 27  ALT 23  ALKPHOS 72  BILITOT 1.3*  PROT 6.8  ALBUMIN 3.7   No results for input(s): LIPASE, AMYLASE in the last 168 hours. No results for input(s): AMMONIA in the last 168 hours. Coagulation Profile: No results for input(s): INR, PROTIME in the last 168 hours. Cardiac Enzymes:  Recent Labs Lab 04/27/17 2035 04/28/17 0616 04/29/17 0821  CKTOTAL 732* 1,912* 567*  TROPONINI <0.03  --   --    BNP (last 3 results) No results for input(s): PROBNP in the last 8760 hours. HbA1C: No results for input(s): HGBA1C in the last 72 hours. CBG: No results for input(s): GLUCAP in the last 168 hours. Lipid Profile:  Recent Labs  04/30/17 0508  CHOL 139  HDL 63  LDLCALC 66  TRIG 51  CHOLHDL 2.2   Thyroid Function Tests:  Recent Labs  04/28/17 0616  TSH 0.965   Anemia Panel: No results for input(s): VITAMINB12, FOLATE, FERRITIN, TIBC, IRON, RETICCTPCT in the last 72 hours. Urine analysis:    Component Value Date/Time   COLORURINE YELLOW 04/27/2017 1951   APPEARANCEUR HAZY (A) 04/27/2017 1951   LABSPEC 1.014 04/27/2017 1951   PHURINE 5.0 04/27/2017 1951   GLUCOSEU NEGATIVE 04/27/2017 1951   HGBUR MODERATE (A) 04/27/2017 1951   BILIRUBINUR NEGATIVE 04/27/2017 1951   KETONESUR 5 (A) 04/27/2017 1951   PROTEINUR 30 (A) 04/27/2017 1951   UROBILINOGEN 1.0 12/22/2007 1456   NITRITE NEGATIVE 04/27/2017 1951   LEUKOCYTESUR MODERATE (A) 04/27/2017 1951     Tanzania Basham M.D. [facility] 04/30/2017, 11:37 AM  Pager: [phone] Between 7am to 7pm - call Pager - [phone]  After 7pm go to [email or web address] - password [email or web address]  Call night coverage person covering after 7pm

## 2017-04-30 NOTE — Progress Notes (Signed)
STROKE TEAM PROGRESS NOTE   HISTORY OF PRESENT ILLNESS (per record) [name] is an 83 y.o. male with hx of progressive Alzheimer's dementia, amyloid angiopathy, and prostate cancer, who lives at home with help, found unresponsive on his porch.  He reportedly outside in the hot day today, and neighbors found him unconscious.  No bowel or bladder incontinence.  He was found to be hypotensive, with SBP 70's and was given IVF bolus.  MRI was done which revealed scattered small acute infracts and was transferred to [place] for further workup.  Hospitalsit was asked to admit him for UTI, syncope, likely dehydration, and mild rhabdomyolysis.   Patient was not administered IV t-PA secondary to high hemorrhage risk due to amyloid angiopathy secondary to Alzheimer's disease. He was admitted to General Neurology for further evaluation and treatment.   SUBJECTIVE (INTERVAL HISTORY) He is alone at the bedside.He seems pleasantly demented but does not have any focal motor deficits   OBJECTIVE Temp:  [97.1 F (36.2 C)-97.6 F (36.4 C)] 97.6 F (36.4 C) (06/26 1028) Pulse Rate:  [39-55] 42 (06/26 1410) Cardiac Rhythm: Sinus bradycardia (06/26 1604) Resp:  [16-18] 18 (06/26 1410) BP: (86-153)/(51-89) 86/51 (06/26 1410) SpO2:  [98 %-100 %] 98 % (06/26 1410)  CBC:  Recent Labs Lab 04/27/17 2035  04/29/17 0821 04/30/17 0508  WBC 12.2*  < > 7.5 9.3  NEUTROABS 11.0*  --   --   --   HGB 12.9*  < > 11.8* 11.4*  HCT 39.1  < > 35.2* 34.8*  MCV 93.5  < > 92.1 91.3  PLT 267  < > 245 236  < > = values in this interval not displayed.  Basic Metabolic Panel:  Recent Labs Lab 04/28/17 0616 04/29/17 0821  NA 141 139  K 3.6 3.7  CL 109 110  CO2 23 21*  GLUCOSE 133* 93  BUN 16 11  CREATININE 0.91 0.68  CALCIUM 8.8* 8.3*    Lipid Panel:    Component Value Date/Time   CHOL 139 04/30/2017 0508   TRIG 51 04/30/2017 0508   HDL 63 04/30/2017 0508   CHOLHDL 2.2 04/30/2017 0508   VLDL 10 04/30/2017  0508   LDLCALC 66 04/30/2017 0508   HgbA1c: No results found for: HGBA1C Urine Drug Screen: No results found for: LABOPIA, COCAINSCRNUR, LABBENZ, AMPHETMU, THCU, LABBARB  Alcohol Level No results found for: ETH  IMAGING  US Carotid Bilateral (at [facility] Only) 04/29/2017 IMPRESSION: 1. Moderate amount of left-sided atherosclerotic plaque, not resulting in a hemodynamically significant stenosis. 2. Minimal amount of right-sided intimal wall thickening, not resulting in a hemodynamically significant stenosis.  MR Brain WO Contrast 04/28/2017 IMPRESSION: 1. Small acute infarcts in the medulla, left cerebellum, andposterior frontal lobes. 2. Advanced cerebral atrophy and chronic small vessel ischemic disease with chronic infarcts as above.  CT Head 04/27/2017 Severe atrophy and extensive white matter disease. Evidence for chronic cerebral infarction.   PHYSICAL EXAM Pleasant elderly African-American male currently not in distress. . Afebrile. Head is nontraumatic. Neck is supple without bruit.    Cardiac exam no murmur or gallop. Lungs are clear to auscultation. Distal pulses are well felt. Neurological Exam ;  Awake  Alert disoriented x 3. Normal speech and language.diminished attention, registration and recall. Poor insight into his condition. Follows only simple midline and few 1 step commands. Eye movements full without nystagmus.fundi were not visualized. Vision acuity and fields appear normal. Hearing is normal. Palatal movements are normal. Face symmetric. Tongue midline.  Normal strength, tone, reflexes and coordination. Normal sensation. Gait deferred.  ASSESSMENT/PLAN [name] is a 83 y.o. male with history of  progressive Alzheimer's dementia, amyloid angiopathy, and prostate cancer who was found unresponsive on his porch.  Patient was not administered IV t-PA secondary to high hemorrhage risk due to amyloid angiopathy secondary to Alzheimer's disease.  The patient is  not a suitable candidate for thrombolytic therapy, anticoagulation, or an extensive stroke workup due to advanced dementia and high risk for bleeding secondary to amyloid angiopathy.  Stroke:  Small acute infarcts in the medulla, left cerebellum, and posterior frontal lobes likely embolic secondary to amyloid angiopathy/small vessel disease  Resultant  no focal motor deficits but baseline dementia  CT head: stroke  MRI head: Small acute infarcts in the medulla, left cerebellum, andposterior frontal lobes.  Carotid Doppler: B ICA 1-39% stenosis, VAs antegrade  2D Echo: EF 60-65%. No source of embolus   LDL 66  HgbA1c pending   SCDs for VTE prophylaxis  Diet regular Room service appropriate? Yes; Fluid consistency: Thin  No antithrombotic prior to admission, now on aspirin 325 mg daily  Patient counseled to be compliant with his antithrombotic medications  Ongoing aggressive stroke risk factor management  Therapy recommendations:  Pending   Disposition:  Pending   Hypertension  Stable Long-term BP goal normotensive  Diabetes  HgbA1c pending , goal < 7.0  Controlled  Other Stroke Risk Factors  Advanced age  Hx stroke/TIA  History of cancer  Other Active Problems  None  Hospital day # 2  I have personally examined this patient, reviewed notes, independently viewed imaging studies, participated in medical decision making and plan of care.ROS completed by me personally and pertinent positives fully documented  I have made any additions or clarifications directly to the above note. The patient has significant baseline dementia and sees Dr. Krista Blue in our office. He was found to have altered mental status in the setting of significant hypertension and urinary tract infection and sepsis. MRI scan shows multiple small infarcts but patient is not a good long-term candidate for anticoagulation given significant amyloid angiopathy and dementia hence we will not pursue  invasive cardiac workup. Continue aspirin and supportive care. Will likely need nursing home placement. Greater than 50% time during this 35 minute visit was spent for strokes. No family available at bedside. Discussed with Dr.Rai Respond hemorrhage  Antony Contras, MD Medical Director [place] Pager: [phone] 04/30/2017 6:15 PM   To contact Stroke Continuity provider, please refer to [email or web address] After hours, contact General Neurology

## 2017-04-30 NOTE — Progress Notes (Signed)
PT Cancellation Note  Patient Details Name: [name] MRN: [id number] DOB: 1932/09/07   Cancelled Treatment:    Reason Eval/Treat Not Completed: Other (comment).  Per RN pt is headed to CT any minute now and was given ativan to tolerate the procedure and is currently out of it.  RN suggested coming back tomorrow and bringing a second person to help.    PT to check back tomorrow.   Thanks,    Barbarann Ehlers. [place], [place], DPT [phone]   04/30/2017, 4:19 PM

## 2017-04-30 NOTE — Care Management Note (Signed)
Case Management Note  Patient Details  Name: Maleik Vanderzee MRN: [id number] Date of Birth: 05-08-1932  Subjective/Objective:   Pt admitted with CVA. She is from home alone.                 Action/Plan: Awaiting PT/OT recommendations. CM following for d/c needs, physician orders.   Expected Discharge Date:                  Expected Discharge Plan:     In-House Referral:     Discharge planning Services     Post Acute Care Choice:    Choice offered to:     DME Arranged:    DME Agency:     HH Arranged:    HH Agency:     Status of Service:  In process, will continue to follow  If discussed at Long Length of Stay Meetings, dates discussed:    Additional Comments:  Pollie Friar, RN 04/30/2017, 11:35 AM

## 2017-05-01 LAB — HEMOGLOBIN A1C
HEMOGLOBIN A1C: 5.4 % (ref 4.8–5.6)
MEAN PLASMA GLUCOSE: 108 mg/dL

## 2017-05-01 MED ORDER — CEFPODOXIME PROXETIL 200 MG PO TABS
200.0000 mg | ORAL_TABLET | Freq: Two times a day (BID) | ORAL | Status: DC
  Administered 2017-05-01 – 2017-05-02 (×3): 200 mg via ORAL
  Filled 2017-05-01 (×4): qty 1

## 2017-05-01 MED ORDER — FAMOTIDINE 20 MG PO TABS
20.0000 mg | ORAL_TABLET | Freq: Two times a day (BID) | ORAL | Status: DC
  Administered 2017-05-01 – 2017-05-02 (×3): 20 mg via ORAL
  Filled 2017-05-01 (×3): qty 1

## 2017-05-01 NOTE — NC FL2 (Signed)
[place] LEVEL OF CARE SCREENING TOOL     IDENTIFICATION  Patient Name: [name] Birthdate: 1932-03-15 Sex: male Admission Date (Current Location): 04/27/2017  [facility] and [place] Number:  [employer] and Address:  [facility], [place] [address], [place], [place] [address]      Provider Number: [id number]  Attending Physician Name and Address:  Debbe Odea, MD  Relative Name and Phone Number:       Current Level of Care: Hospital Recommended Level of Care: [place] Prior Approval Number:    Date Approved/Denied:   PASRR Number: [id number]  Discharge Plan: SNF    Current Diagnoses: Patient Active Problem List   Diagnosis Date Noted  . CVA (cerebral vascular accident) ([place]) 04/30/2017  . UTI (urinary tract infection) 04/28/2017  . Renal failure syndrome   . Syncope and collapse 04/27/2017  . Dehydration 04/27/2017  . Dementia 04/27/2017  . Rhabdomyolysis 04/27/2017  . Memory loss 02/21/2016  . Closed femur fracture ([place]) 07/03/2014  . Femur fracture, left ([place]) 07/03/2014  . Bradycardia 07/03/2014  . Abdominal pain, other specified site 10/14/2013  . Nausea & vomiting 10/14/2013    Orientation RESPIRATION BLADDER Height & Weight     Self, Place  Normal External catheter Weight: 145 lb (65.8 kg) Height:  5\' 10"  (177.8 cm)  BEHAVIORAL SYMPTOMS/MOOD NEUROLOGICAL BOWEL NUTRITION STATUS      Continent    AMBULATORY STATUS COMMUNICATION OF NEEDS Skin   Extensive Assist Verbally Normal                       Personal Care Assistance Level of Assistance  Bathing, Dressing Bathing Assistance: Maximum assistance   Dressing Assistance: Maximum assistance     Functional Limitations Info  Sight Sight Info: Impaired        SPECIAL CARE FACTORS FREQUENCY  PT (By licensed PT), OT (By licensed OT)     PT Frequency: will assess OT Frequency: will assess            Contractures       Additional Factors Info  Code Status, Allergies Code Status Info: DNR Allergies Info: NKA           Current Medications (05/01/2017):  This is the current hospital active medication list Current Facility-Administered Medications  Medication Dose Route Frequency Provider Last Rate Last Dose  .  stroke: mapping our early stages of recovery book   Does not apply Once Robbie Lis, MD      . 0.9 %  sodium chloride infusion   Intravenous Continuous Debbe Odea, MD 100 mL/hr at 04/30/17 2113    . aspirin tablet 325 mg  325 mg Oral Daily Orvan Falconer, MD   325 mg at 05/01/17 0856  . cefpodoxime (VANTIN) tablet 200 mg  200 mg Oral Q12H Rizwan, Saima, MD      . donepezil (ARICEPT) tablet 10 mg  10 mg Oral QHS Orvan Falconer, MD   10 mg at 04/29/17 2226  . famotidine (PEPCID) tablet 20 mg  20 mg Oral BID Debbe Odea, MD   20 mg at 05/01/17 1242  . latanoprost (XALATAN) 0.005 % ophthalmic solution 1 drop  1 drop Both Eyes QHS Orvan Falconer, MD      . memantine [facility]) tablet 10 mg  10 mg Oral BID Orvan Falconer, MD   10 mg at 05/01/17 0856  . sodium chloride flush (NS) 0.9 % injection 3 mL  3 mL Intravenous Q12H Orvan Falconer, MD   3 mL at 04/29/17 0300     Discharge Medications: Please see discharge summary for a list of discharge medications.  Relevant Imaging Results:  Relevant Lab Results:   Additional Information SS#: [id number]  Geralynn Ochs, LCSW

## 2017-05-01 NOTE — Evaluation (Signed)
Physical Therapy Evaluation Patient Details Name: [name] MRN: [id number] DOB: 1932-11-02 Today's Date: 05/01/2017   History of Present Illness  Pt is a 83 year old male with dementia, prostate cancer who presented to [facility] after he was found unresponsive on his porch at home. He was hypotensive on admission with SBP in 70's which has improved with IV fluids. CT head showed no acute intracranial findings but pt does have evidence of prior cerebral infarction. He was also found to have UTI. Patient also had MRI of the brain which showed small acute infarcts in the medulla, left cerebellum and posterior frontal lobes.  Clinical Impression  Pt presented supine in bed with HOB elevated, awake and willing to participate in therapy session. Prior to admission, pt reported that he ambulated with use of SPC and required assistance with ADLs. Pt's grandson reported that he had an aide six days a week for eight hours per day that assisted him. Pt currently requires max A x2 for bed mobility, max A to maintain midline sitting posture at EOB and max to total A x2 for transfers. Pt very heavy pusher towards his R side in sitting and with standing. Pt would continue to benefit from skilled physical therapy services at this time while admitted and after d/c to address the below listed limitations in order to improve overall safety and independence with functional mobility.     Follow Up Recommendations SNF    Equipment Recommendations  None recommended by PT    Recommendations for Other Services       Precautions / Restrictions Precautions Precautions: Fall Restrictions Weight Bearing Restrictions: No      Mobility  Bed Mobility Overal bed mobility: Needs Assistance Bed Mobility: Rolling;Sidelying to Sit Rolling: Min assist Sidelying to sit: Max assist;+2 for physical assistance       General bed mobility comments: increased time, use of bed rails, vc'ing for sequencing, max A x2 to achieve  sitting EOB  Transfers Overall transfer level: Needs assistance Equipment used: 2 person hand held assist Transfers: Sit to/from [facility] Sit to Stand: Max assist;+2 physical assistance Stand pivot transfers: Total assist;+2 physical assistance       General transfer comment: increased time, vc'ing for sequencing and bilateral hand placement, max A x2 to rise from sitting EOB with use of bed pads, total A x2 for pivotal movements to recliner chair  Ambulation/Gait                Stairs            Wheelchair Mobility    Modified Rankin (Stroke Patients Only) Modified Rankin (Stroke Patients Only) Pre-Morbid Rankin Score: Moderate disability Modified Rankin: Severe disability     Balance Overall balance assessment: Needs assistance Sitting-balance support: Feet supported;Bilateral upper extremity supported Sitting balance-Leahy Scale: Poor Sitting balance - Comments: heavy pushing to his R side, required max A to achieve and maintain midline position. pt tolerated lateral weight shifting and midline finding activities sitting EOB Postural control: Right lateral lean;Posterior lean Standing balance support: During functional activity Standing balance-Leahy Scale: Zero Standing balance comment: max to total A x2                             Pertinent Vitals/Pain Pain Assessment: Faces Faces Pain Scale: No hurt    Home Living Family/patient expects to be discharged to:: Private residence Living Arrangements: Alone Available Help at Discharge: Family;Personal care attendant Type  of Home: House Home Access: Stairs to enter Entrance Stairs-Rails: Psychiatric nurse of Steps: 3 Home Layout: One level Home Equipment: Cane - single point      Prior Function Level of Independence: Needs assistance   Gait / Transfers Assistance Needed: pt ambulates with use of SPC  ADL's / Homemaking Assistance Needed: requires  assistance for bathing, dressing, meal preparation        Hand Dominance        Extremity/Trunk Assessment   Upper Extremity Assessment Upper Extremity Assessment: Defer to OT evaluation    Lower Extremity Assessment Lower Extremity Assessment: Generalized weakness    Cervical / Trunk Assessment Cervical / Trunk Assessment: Other exceptions Cervical / Trunk Exceptions: pt with preference for R lateral lean/pushing  Communication   Communication: No difficulties  Cognition Arousal/Alertness: Awake/alert Behavior During Therapy: Flat affect Overall Cognitive Status: Impaired/Different from baseline Area of Impairment: Attention;Orientation;Memory;Following commands;Safety/judgement;Awareness;Problem solving                 Orientation Level: Disoriented to;Person;Time;Situation Current Attention Level: Sustained Memory: Decreased short-term memory Following Commands: Follows one step commands inconsistently;Follows one step commands with increased time Safety/Judgement: Decreased awareness of deficits;Decreased awareness of safety Awareness: Intellectual Problem Solving: Slow processing;Decreased initiation;Difficulty sequencing;Requires verbal cues;Requires tactile cues        General Comments      Exercises     Assessment/Plan    PT Assessment Patient needs continued PT services  PT Problem List Decreased strength;Decreased activity tolerance;Decreased balance;Decreased mobility;Decreased coordination;Decreased knowledge of use of DME;Decreased cognition;Decreased safety awareness       PT Treatment Interventions DME instruction;Gait training;Stair training;Functional mobility training;Therapeutic activities;Therapeutic exercise;Neuromuscular re-education;Balance training;Cognitive remediation;Patient/family education    PT Goals (Current goals can be found in the Care Plan section)  Acute Rehab PT Goals Patient Stated Goal: none stated PT Goal  Formulation: With patient Time For Goal Achievement: 05/15/17 Potential to Achieve Goals: Fair    Frequency Min 3X/week   Barriers to discharge        Co-evaluation PT/OT/SLP Co-Evaluation/Treatment: Yes Reason for Co-Treatment: Complexity of the patient's impairments (multi-system involvement);Necessary to address cognition/behavior during functional activity;For patient/therapist safety;To address functional/ADL transfers PT goals addressed during session: Mobility/safety with mobility;Balance;Strengthening/ROM         AM-PAC PT "6 Clicks" Daily Activity  Outcome Measure Difficulty turning over in bed (including adjusting bedclothes, sheets and blankets)?: Total Difficulty moving from lying on back to sitting on the side of the bed? : Total Difficulty sitting down on and standing up from a chair with arms (e.g., wheelchair, bedside commode, etc,.)?: Total Help needed moving to and from a bed to chair (including a wheelchair)?: Total Help needed walking in hospital room?: Total Help needed climbing 3-5 steps with a railing? : Total 6 Click Score: 6    End of Session Equipment Utilized During Treatment: Gait belt Activity Tolerance: Patient tolerated treatment well Patient left: in chair;with call bell/phone within reach;with family/visitor present;Other (comment) (chair alarm pad in chair but no box available) Nurse Communication: Mobility status;Need for lift equipment;Other (comment) (chair alarm pad in chair but no box available) PT Visit Diagnosis: Other symptoms and signs involving the nervous system (D17.616)    Time: 0737-1062 PT Time Calculation (min) (ACUTE ONLY): 38 min   Charges:   PT Evaluation $PT Eval Moderate Complexity: 1 Procedure PT Treatments $Therapeutic Activity: 8-22 mins   PT G Codes:        [place], PT, DPT [place]  05/01/2017, 4:17 PM

## 2017-05-01 NOTE — Progress Notes (Signed)
PROGRESS NOTE    [name]   PPJ:[id number]  DOB: June 23, 1932  DOA: 04/27/2017 PCP: Dione Housekeeper, MD   Brief Narrative:  83 year old male with dementia who presented to [facility] after he was found unresponsive on his porch at home. He was hypotensive on admission with SBP in 70's which has improved with IV fluids. CT head showed no acute intracranial findings but pt does have evidence of prior cerebral infarction. He was also found to have UTI. Patient also had MRI of the brain which showed small acute infarcts in the medulla, left cerebellum and posterior frontal lobes. Patient was transferred from [facility] to [facility] for stroke workup  Subjective: Sleepy but easily awakens. He has no complaints.   Assessment & Plan:  Principal Problem:   CVA (cerebral vascular accident) (HCC)Presented as acute encephalopathy and syncopal episode - MRI of the brain showed small acute infarcts in the medulla, left cerebellum and posterior frontal lobes, advanced cerebral atrophy and chronic small vessel ischemic disease with chronic infarcts - Patient was transferred to [facility] for stroke workup, neurology consulted- per neurology, patient is not a good long-term candidate for anticoagulation given significant amyloid angiopathy and dementia  - 2-D echo showed EF of 60-65%, no regional wall motion abnormalities -Carotid Dopplers showed moderate amount of left-sided death restarted block, minimal right-sided intimal wall thickening, no significant stenosis on both sides  - CTA shows no stenosis and a congenitally diminutive right vertebral artery. -LDL 66  -On aspirin 325 mg daily  - PTOT evaluation pending, lives alone and has a caretaker who is there for part of the day, was found by neighbor unresponsive- will  need SNF  Active Problems:     Dementia - Continue Aricept      UTI (urinary tract infection) - on IV Rocephin- change to Vantin- urine culture showed multiple  species  Acute kidney injury likely due to dehydration, rhabdomyolysis  - Resolved, continue gentle hydration   Prostate cancer  DVT prophylaxis: SCDs Code Status: DNR Family Communication: extensive discussion with family today Disposition Plan: SNF Consultants:   Neurology/ stroke team Procedures:    Antimicrobials:  Anti-infectives    Start     Dose/Rate Route Frequency Ordered Stop   05/01/17 1200  cefpodoxime (VANTIN) tablet 200 mg     200 mg Oral Every 12 hours 05/01/17 1150     04/28/17 0945  cefTRIAXone (ROCEPHIN) 1 g in dextrose 5 % 50 mL IVPB  Status:  Discontinued     1 g 100 mL/hr over 30 Minutes Intravenous Every 24 hours 04/28/17 0931 05/01/17 1150       Objective: Vitals:   05/01/17 0109 05/01/17 0536 05/01/17 1103 05/01/17 1430  BP: (!) 160/74 (!) 156/70 (!) 105/56 140/62  Pulse: (!) 46 (!) 48  (!) 50  Resp: 18 18 18 18   Temp:    98.6 F (37 C)  TempSrc: Other (Comment) Other (Comment) Axillary Oral  SpO2: 100% 98% 98% 98%  Weight:      Height:        Intake/Output Summary (Last 24 hours) at 05/01/17 1620 Last data filed at 05/01/17 0355  Gross per 24 hour  Intake                0 ml  Output             1700 ml  Net            -1700 ml  Filed Weights   04/27/17 1931 04/28/17 0014  Weight: 65.8 kg (145 lb) 65.8 kg (145 lb)    Examination: General exam: Appears comfortable  HEENT: PERRLA, oral mucosa moist, no sclera icterus or thrush Respiratory system: Clear to auscultation. Respiratory effort normal. Cardiovascular system: S1 & S2 heard, RRR.  No murmurs  Gastrointestinal system: Abdomen soft, non-tender, nondistended. Normal bowel sound. No organomegaly Central nervous system: Alert - No focal neurological deficits. Disoriented to place and time Extremities: No cyanosis, clubbing or edema Skin: No rashes or ulcers Psychiatry:  Mood & affect appropriate.     Data Reviewed: I have personally reviewed following labs and imaging  studies  CBC:  Recent Labs Lab 04/27/17 2035 04/28/17 0616 04/29/17 0821 04/30/17 0508  WBC 12.2* 11.5* 7.5 9.3  NEUTROABS 11.0*  --   --   --   HGB 12.9* 12.8* 11.8* 11.4*  HCT 39.1 38.2* 35.2* 34.8*  MCV 93.5 92.5 92.1 91.3  PLT 267 266 245 161   Basic Metabolic Panel:  Recent Labs Lab 04/27/17 2035 04/28/17 0616 04/29/17 0821  NA 141 141 139  K 4.4 3.6 3.7  CL 107 109 110  CO2 25 23 21*  GLUCOSE 128* 133* 93  BUN 20 16 11   CREATININE 1.38* 0.91 0.68  CALCIUM 9.1 8.8* 8.3*   GFR: Estimated Creatinine Clearance: 65.1 mL/min (by C-G formula based on SCr of 0.68 mg/dL). Liver Function Tests:  Recent Labs Lab 04/27/17 2035  AST 27  ALT 23  ALKPHOS 72  BILITOT 1.3*  PROT 6.8  ALBUMIN 3.7   No results for input(s): LIPASE, AMYLASE in the last 168 hours. No results for input(s): AMMONIA in the last 168 hours. Coagulation Profile: No results for input(s): INR, PROTIME in the last 168 hours. Cardiac Enzymes:  Recent Labs Lab 04/27/17 2035 04/28/17 0616 04/29/17 0821  CKTOTAL 732* 1,912* 567*  TROPONINI <0.03  --   --    BNP (last 3 results) No results for input(s): PROBNP in the last 8760 hours. HbA1C:  Recent Labs  04/30/17 0508  HGBA1C 5.4   CBG: No results for input(s): GLUCAP in the last 168 hours. Lipid Profile:  Recent Labs  04/30/17 0508  CHOL 139  HDL 63  LDLCALC 66  TRIG 51  CHOLHDL 2.2   Thyroid Function Tests: No results for input(s): TSH, T4TOTAL, FREET4, T3FREE, THYROIDAB in the last 72 hours. Anemia Panel: No results for input(s): VITAMINB12, FOLATE, FERRITIN, TIBC, IRON, RETICCTPCT in the last 72 hours. Urine analysis:    Component Value Date/Time   COLORURINE YELLOW 04/27/2017 1951   APPEARANCEUR HAZY (A) 04/27/2017 1951   LABSPEC 1.014 04/27/2017 1951   PHURINE 5.0 04/27/2017 1951   GLUCOSEU NEGATIVE 04/27/2017 1951   HGBUR MODERATE (A) 04/27/2017 1951   BILIRUBINUR NEGATIVE 04/27/2017 1951   KETONESUR 5 (A)  04/27/2017 1951   PROTEINUR 30 (A) 04/27/2017 1951   UROBILINOGEN 1.0 12/22/2007 1456   NITRITE NEGATIVE 04/27/2017 1951   LEUKOCYTESUR MODERATE (A) 04/27/2017 1951   Sepsis Labs: @LABRCNTIP (procalcitonin:4,lacticidven:4) ) Recent Results (from the past 240 hour(s))  Urine culture     Status: Abnormal   Collection Time: 04/27/17  7:51 PM  Result Value Ref Range Status   Specimen Description URINE, CLEAN CATCH  Final   Special Requests NONE  Final   Culture MULTIPLE SPECIES PRESENT, SUGGEST RECOLLECTION (A)  Final   Report Status 04/30/2017 FINAL  Final         Radiology Studies: Ct Angio Head W  Or Wo Contrast  Result Date: 04/30/2017 CLINICAL DATA:  Acute encephalopathy and syncopal episode. Multiple small acute brainstem and posterior fossa infarcts. EXAM: CT ANGIOGRAPHY HEAD AND NECK TECHNIQUE: Multidetector CT imaging of the head and neck was performed using the standard protocol during bolus administration of intravenous contrast. Multiplanar CT image reconstructions and MIPs were obtained to evaluate the vascular anatomy. Carotid stenosis measurements (when applicable) are obtained utilizing NASCET criteria, using the distal internal carotid diameter as the denominator. CONTRAST:  50 mL Isovue 370 COMPARISON:  Brain MRI 04/28/2017 FINDINGS: CT HEAD FINDINGS Brain: No hemorrhage or mass effect. There is faint hypoattenuation at the sites of diffusion restriction described on recent MRI. There is periventricular hypoattenuation compatible with chronic microvascular disease. Vascular: No hyperdense vessel or unexpected calcification. Skull: Normal visualized skull base, calvarium and extracranial soft tissues. Sinuses/Orbits: No sinus fluid levels or advanced mucosal thickening. No mastoid effusion. Normal orbits. CTA NECK FINDINGS Aortic arch: There is no aneurysm or dissection of the visualized ascending aorta or aortic arch. There is a normal 3 vessel branching pattern. The visualized  proximal subclavian arteries are normal. Right carotid system: The right common carotid origin is widely patent. There is no common carotid or internal carotid artery dissection or aneurysm. No hemodynamically significant stenosis. Left carotid system: The left common carotid origin is widely patent. There is no common carotid or internal carotid artery dissection or aneurysm. No hemodynamically significant stenosis. Vertebral arteries: The vertebral system is left dominant. There is calcification in the left P1 segment. The right vertebral artery is diminutive along its entire course. There is no flow demonstrated in the right V4 segment. Skeleton: There is no bony spinal canal stenosis. No lytic or blastic lesions. Other neck: The nasopharynx is clear. The oropharynx and hypopharynx are normal. The epiglottis is normal. The supraglottic larynx, glottis and subglottic larynx are normal. No retropharyngeal collection. The parapharyngeal spaces are preserved. The parotid and submandibular glands are normal. No sialolithiasis or salivary ductal dilatation. The thyroid gland is normal. There is no cervical lymphadenopathy. Upper chest: There is ground-glass opacity in the posterior right upper lobe. Small bilateral pleural effusions with associated atelectasis. Review of the MIP images confirms the above findings CTA HEAD FINDINGS Anterior circulation: --Intracranial internal carotid arteries: Normal. --Anterior cerebral arteries: Normal. --Middle cerebral arteries: Normal. --Posterior communicating arteries: Present on the right. Posterior circulation: --Posterior cerebral arteries: Normal. --Superior cerebellar arteries: Normal. --Basilar artery: Normal. --Anterior inferior cerebellar arteries: Normal. --Posterior inferior cerebellar arteries: Left dominant. Venous sinuses: As permitted by contrast timing, patent. Anatomic variants: None Delayed phase: No parenchymal contrast enhancement. Review of the MIP images  confirms the above findings IMPRESSION: 1. No emergent large vessel occlusion. 2. No hemodynamically significant stenosis of the major cervical arteries. 3. Congenitally diminutive right vertebral artery. Electronically Signed   By: Ulyses Jarred M.D.   On: 04/30/2017 21:22   Ct Angio Neck W Or Wo Contrast  Result Date: 04/30/2017 CLINICAL DATA:  Acute encephalopathy and syncopal episode. Multiple small acute brainstem and posterior fossa infarcts. EXAM: CT ANGIOGRAPHY HEAD AND NECK TECHNIQUE: Multidetector CT imaging of the head and neck was performed using the standard protocol during bolus administration of intravenous contrast. Multiplanar CT image reconstructions and MIPs were obtained to evaluate the vascular anatomy. Carotid stenosis measurements (when applicable) are obtained utilizing NASCET criteria, using the distal internal carotid diameter as the denominator. CONTRAST:  50 mL Isovue 370 COMPARISON:  Brain MRI 04/28/2017 FINDINGS: CT HEAD FINDINGS Brain: No hemorrhage or mass  effect. There is faint hypoattenuation at the sites of diffusion restriction described on recent MRI. There is periventricular hypoattenuation compatible with chronic microvascular disease. Vascular: No hyperdense vessel or unexpected calcification. Skull: Normal visualized skull base, calvarium and extracranial soft tissues. Sinuses/Orbits: No sinus fluid levels or advanced mucosal thickening. No mastoid effusion. Normal orbits. CTA NECK FINDINGS Aortic arch: There is no aneurysm or dissection of the visualized ascending aorta or aortic arch. There is a normal 3 vessel branching pattern. The visualized proximal subclavian arteries are normal. Right carotid system: The right common carotid origin is widely patent. There is no common carotid or internal carotid artery dissection or aneurysm. No hemodynamically significant stenosis. Left carotid system: The left common carotid origin is widely patent. There is no common carotid or  internal carotid artery dissection or aneurysm. No hemodynamically significant stenosis. Vertebral arteries: The vertebral system is left dominant. There is calcification in the left P1 segment. The right vertebral artery is diminutive along its entire course. There is no flow demonstrated in the right V4 segment. Skeleton: There is no bony spinal canal stenosis. No lytic or blastic lesions. Other neck: The nasopharynx is clear. The oropharynx and hypopharynx are normal. The epiglottis is normal. The supraglottic larynx, glottis and subglottic larynx are normal. No retropharyngeal collection. The parapharyngeal spaces are preserved. The parotid and submandibular glands are normal. No sialolithiasis or salivary ductal dilatation. The thyroid gland is normal. There is no cervical lymphadenopathy. Upper chest: There is ground-glass opacity in the posterior right upper lobe. Small bilateral pleural effusions with associated atelectasis. Review of the MIP images confirms the above findings CTA HEAD FINDINGS Anterior circulation: --Intracranial internal carotid arteries: Normal. --Anterior cerebral arteries: Normal. --Middle cerebral arteries: Normal. --Posterior communicating arteries: Present on the right. Posterior circulation: --Posterior cerebral arteries: Normal. --Superior cerebellar arteries: Normal. --Basilar artery: Normal. --Anterior inferior cerebellar arteries: Normal. --Posterior inferior cerebellar arteries: Left dominant. Venous sinuses: As permitted by contrast timing, patent. Anatomic variants: None Delayed phase: No parenchymal contrast enhancement. Review of the MIP images confirms the above findings IMPRESSION: 1. No emergent large vessel occlusion. 2. No hemodynamically significant stenosis of the major cervical arteries. 3. Congenitally diminutive right vertebral artery. Electronically Signed   By: Ulyses Jarred M.D.   On: 04/30/2017 21:22      Scheduled Meds: .  stroke: mapping our early  stages of recovery book   Does not apply Once  . aspirin  325 mg Oral Daily  . cefpodoxime  200 mg Oral Q12H  . donepezil  10 mg Oral QHS  . famotidine  20 mg Oral BID  . latanoprost  1 drop Both Eyes QHS  . memantine  10 mg Oral BID  . sodium chloride flush  3 mL Intravenous Q12H   Continuous Infusions: . sodium chloride 100 mL/hr at 04/30/17 2113     LOS: 3 days    Time spent in minutes: 35    Debbe Odea, MD [facility] Pager: [email or web address] Password [email or web address] 05/01/2017, 4:20 PM

## 2017-05-01 NOTE — Evaluation (Signed)
Occupational Therapy Evaluation Patient Details Name: [name] MRN: [id number] DOB: [birth year] Today's Date: 05/01/2017    History of Present Illness Pt is a 83 year old male with dementia, prostate cancer who presented to [facility] after he was found unresponsive on his porch at home. He was hypotensive on admission with SBP in 70's which has improved with IV fluids. CT head showed no acute intracranial findings but pt does have evidence of prior cerebral infarction. He was also found to have UTI. Patient also had MRI of the brain which showed small acute infarcts in the medulla, left cerebellum and posterior frontal lobes.   Clinical Impression   Pt presented supine in bed with HOB elevated, awake and willing to participate in therapy session. PTA, pt reported that he ambulated with use of SPC and required assistance with ADLs. Pt's grandson reported that he had an aide six days a week for eight hours per day that assisted him. Pt currently requires max A x2 for bed mobility (Pt able to roll with min guard), max A to maintain midline sitting posture at EOB and max to total A x2 for transfers. Pt very heavy pusher towards his R side in sitting and with standing. Pt would continue to benefit from skilled occupational therapy services at this time while admitted and after d/c to maximize safety and independence in ADL and functional transfers. Next session to focus on seated balance (pushing) and to assess vision (did not assess this session).    Follow Up Recommendations  SNF;Supervision/Assistance - 24 hour    Equipment Recommendations  Other (comment) (defer to next venue)    Recommendations for Other Services       Precautions / Restrictions Precautions Precautions: Fall Precaution Comments: R lateral/posterior lean/pushing Restrictions Weight Bearing Restrictions: No      Mobility Bed Mobility Overal bed mobility: Needs Assistance Bed Mobility: Rolling;Sidelying to Sit Rolling: Min  assist Sidelying to sit: Max assist;+2 for physical assistance       General bed mobility comments: increased time, use of bed rails, vc'ing for sequencing, max A x2 to achieve sitting EOB  Transfers Overall transfer level: Needs assistance Equipment used: 2 person hand held assist Transfers: Sit to/from [employer] Sit to Stand: Max assist;+2 physical assistance Stand pivot transfers: Total assist;+2 physical assistance       General transfer comment: increased time, vc'ing for sequencing and bilateral hand placement, max A x2 to rise from sitting EOB with use of bed pads, total A x2 for pivotal movements to recliner chair    Balance Overall balance assessment: Needs assistance Sitting-balance support: Feet supported;Bilateral upper extremity supported Sitting balance-Leahy Scale: Poor Sitting balance - Comments: heavy pushing to his R side, required max A to achieve and maintain midline position. pt tolerated lateral weight shifting and midline finding activities sitting EOB Postural control: Right lateral lean;Posterior lean Standing balance support: During functional activity Standing balance-Leahy Scale: Zero Standing balance comment: max to total A x2                           ADL either performed or assessed with clinical judgement   ADL Overall ADL's : Needs assistance/impaired Eating/Feeding: Set up;Minimal assistance;Sitting   Grooming: Minimal assistance;Sitting   Upper Body Bathing: Moderate assistance;Bed level   Lower Body Bathing: Maximal assistance;Bed level   Upper Body Dressing : Maximal assistance;Sitting   Lower Body Dressing: Maximal assistance;Sit to/from stand   Toilet Transfer: Maximal assistance;+2  for physical assistance;+2 for safety/equipment;Squat-pivot (lift recommended) Toilet Transfer Details (indicate cue type and reason): simulated through transfer to recliner Toileting- Clothing Manipulation and Hygiene:  Maximal assistance Toileting - Clothing Manipulation Details (indicate cue type and reason): peri care performed at bedlevel, Pt able to wash peri area with warm wash cloth     Functional mobility during ADLs: Maximal assistance;+2 for physical assistance;+2 for safety/equipment General ADL Comments: Pt requires assistance with all ADL and is limited due to balance/pushing     Vision   Additional Comments: Vision not assessed this session     Perception     Praxis      Pertinent Vitals/Pain Pain Assessment: Faces Faces Pain Scale: No hurt     Hand Dominance Right   Extremity/Trunk Assessment Upper Extremity Assessment Upper Extremity Assessment: Defer to OT evaluation   Lower Extremity Assessment Lower Extremity Assessment: Generalized weakness   Cervical / Trunk Assessment Cervical / Trunk Assessment: Other exceptions Cervical / Trunk Exceptions: pt with preference for R lateral lean/pushing   Communication Communication Communication: No difficulties   Cognition Arousal/Alertness: Awake/alert Behavior During Therapy: Flat affect Overall Cognitive Status: Impaired/Different from baseline Area of Impairment: Attention;Orientation;Memory;Following commands;Safety/judgement;Awareness;Problem solving                 Orientation Level: Disoriented to;Person;Time;Situation Current Attention Level: Sustained Memory: Decreased short-term memory Following Commands: Follows one step commands inconsistently;Follows one step commands with increased time Safety/Judgement: Decreased awareness of deficits;Decreased awareness of safety Awareness: Intellectual Problem Solving: Slow processing;Decreased initiation;Difficulty sequencing;Requires verbal cues;Requires tactile cues General Comments: Michela Pitcher his grandson was his brother, but knew he was at the hospital   General Comments       Exercises     Shoulder Instructions      Home Living Family/patient expects to be  discharged to:: Private residence Living Arrangements: Alone Available Help at Discharge: Family;Available 24 hours/day;Personal care attendant (6 days/week for 8 hours/day) Type of Home: House Home Access: Stairs to enter [employer] of Steps: 3 Entrance Stairs-Rails: Right;Left Home Layout: One level     Bathroom Shower/Tub: Teacher, early years/pre: Standard     Home Equipment: Radio producer - single point      Lives With: Alone    Prior Functioning/Environment Level of Independence: Needs assistance  Gait / Transfers Assistance Needed: pt ambulates with use of SPC ADL's / Homemaking Assistance Needed: requires assistance for bathing, dressing, meal preparation            OT Problem List: Decreased strength;Decreased range of motion;Decreased activity tolerance;Impaired balance (sitting and/or standing);Decreased coordination;Decreased cognition;Decreased safety awareness;Decreased knowledge of use of DME or AE      OT Treatment/Interventions: Self-care/ADL training;Therapeutic exercise;Neuromuscular education;Therapeutic activities;Cognitive remediation/compensation;Patient/family education;Balance training;DME and/or AE instruction    OT Goals(Current goals can be found in the care plan section) Acute Rehab OT Goals Patient Stated Goal: none stated ADL Goals Pt Will Perform Grooming: with supervision;sitting Pt Will Perform Upper Body Bathing: with supervision;sitting Pt Will Perform Lower Body Bathing: with supervision;sitting/lateral leans Pt Will Transfer to Toilet: with min assist;stand pivot transfer;bedside commode Pt Will Perform Toileting - Clothing Manipulation and hygiene: with min assist;sit to/from stand  OT Frequency: Min 2X/week   Barriers to D/C: Decreased caregiver support  Pt is used to being alone at nighttime       Co-evaluation PT/OT/SLP Co-Evaluation/Treatment: Yes Reason for Co-Treatment: Complexity of the patient's impairments  (multi-system involvement);Necessary to address cognition/behavior during functional activity;For patient/therapist safety;To address functional/ADL transfers PT goals  addressed during session: Mobility/safety with mobility;Balance;Strengthening/ROM OT goals addressed during session: ADL's and self-care      AM-PAC PT "6 Clicks" Daily Activity     Outcome Measure Help from another person eating meals?: A Lot Help from another person taking care of personal grooming?: A Lot Help from another person toileting, which includes using toliet, bedpan, or urinal?: Total Help from another person bathing (including washing, rinsing, drying)?: A Lot Help from another person to put on and taking off regular upper body clothing?: A Lot Help from another person to put on and taking off regular lower body clothing?: Total 6 Click Score: 10   End of Session Equipment Utilized During Treatment: Gait belt Nurse Communication: Mobility status;Other (comment);Need for lift equipment (no chair alarm - use lift for return to bed)  Activity Tolerance: Patient tolerated treatment well Patient left: in chair;with call bell/phone within reach;with family/visitor present (NO chair alarm - RN Aware)  OT Visit Diagnosis: Unsteadiness on feet (R26.81);Other abnormalities of gait and mobility (R26.89);Other symptoms and signs involving the nervous system (R29.898);Other symptoms and signs involving cognitive function                Time: 2897-9150 OT Time Calculation (min): 38 min Charges:  OT General Charges $OT Visit: 1 Procedure OT Evaluation $OT Eval Moderate Complexity: 1 Procedure G-Codes:     Hulda Humphrey OTR/L [place] 05/01/2017, 5:36 PM

## 2017-05-01 NOTE — Plan of Care (Signed)
Problem: Safety: Goal: Ability to remain free from injury will improve Outcome: Progressing Patient did not make any attempts to get out of bed last pm.  Bed alarm in use.  Tele-sitter in room.  Patient progressing towards goal.

## 2017-05-01 NOTE — Progress Notes (Signed)
STROKE TEAM PROGRESS NOTE   HISTORY OF PRESENT ILLNESS (per record) [name] is an 83 y.o. male with hx of progressive Alzheimer's dementia, amyloid angiopathy, and prostate cancer, who lives at home with help, found unresponsive on his porch.  He reportedly outside in the hot day today, and neighbors found him unconscious.  No bowel or bladder incontinence.  He was found to be hypotensive, with SBP 70's and was given IVF bolus.  MRI was done which revealed scattered small acute infracts and was transferred to [facility] for further workup.  Hospitalsit was asked to admit him for UTI, syncope, likely dehydration, and mild rhabdomyolysis.   Patient was not administered IV t-PA secondary to high hemorrhage risk due to amyloid angiopathy secondary to Alzheimer's disease. He was admitted to General Neurology for further evaluation and treatment.   SUBJECTIVE (INTERVAL HISTORY) He has multiple family members at the bedside.He seems more interactive today and  does not have any focal motor deficits   OBJECTIVE Temp:  [92.2 F (33.4 C)] 92.2 F (33.4 C) (06/26 1858) Pulse Rate:  [43-58] 48 (06/27 0536) Cardiac Rhythm: Sinus bradycardia (06/27 0818) Resp:  [18-20] 18 (06/27 1103) BP: (105-160)/(56-89) 105/56 (06/27 1103) SpO2:  [97 %-100 %] 98 % (06/27 1103)  CBC:  Recent Labs Lab 04/27/17 2035  04/29/17 0821 04/30/17 0508  WBC 12.2*  < > 7.5 9.3  NEUTROABS 11.0*  --   --   --   HGB 12.9*  < > 11.8* 11.4*  HCT 39.1  < > 35.2* 34.8*  MCV 93.5  < > 92.1 91.3  PLT 267  < > 245 236  < > = values in this interval not displayed.  Basic Metabolic Panel:   Recent Labs Lab 04/28/17 0616 04/29/17 0821  NA 141 139  K 3.6 3.7  CL 109 110  CO2 23 21*  GLUCOSE 133* 93  BUN 16 11  CREATININE 0.91 0.68  CALCIUM 8.8* 8.3*    Lipid Panel:     Component Value Date/Time   CHOL 139 04/30/2017 0508   TRIG 51 04/30/2017 0508   HDL 63 04/30/2017 0508   CHOLHDL 2.2 04/30/2017 0508   VLDL 10  04/30/2017 0508   LDLCALC 66 04/30/2017 0508   HgbA1c:  Lab Results  Component Value Date   HGBA1C 5.4 04/30/2017   Urine Drug Screen: No results found for: LABOPIA, COCAINSCRNUR, LABBENZ, AMPHETMU, THCU, LABBARB  Alcohol Level No results found for: ETH  IMAGING  US Carotid Bilateral (at [facility]) 04/29/2017 IMPRESSION: 1. Moderate amount of left-sided atherosclerotic plaque, not resulting in a hemodynamically significant stenosis. 2. Minimal amount of right-sided intimal wall thickening, not resulting in a hemodynamically significant stenosis.  MR Brain WO Contrast 04/28/2017 IMPRESSION: 1. Small acute infarcts in the medulla, left cerebellum, andposterior frontal lobes. 2. Advanced cerebral atrophy and chronic small vessel ischemic disease with chronic infarcts as above.  CT Head 04/27/2017 Severe atrophy and extensive white matter disease. Evidence for chronic cerebral infarction.   PHYSICAL EXAM Pleasant elderly African-American male currently not in distress. . Afebrile. Head is nontraumatic. Neck is supple without bruit.    Cardiac exam no murmur or gallop. Lungs are clear to auscultation. Distal pulses are well felt. Neurological Exam ;  Awake  Alert disoriented x 3. Normal speech and language.diminished attention, registration and recall. Poor insight into his condition. Follows only simple midline and few 1 step commands. Eye movements full without nystagmus.fundi were not visualized. Vision acuity and fields appear  normal. Hearing is normal. Palatal movements are normal. Face symmetric. Tongue midline. Normal strength, tone, reflexes and coordination. Normal sensation. Gait deferred.  ASSESSMENT/PLAN Mr. [name] is a 83 y.o. male with history of  progressive Alzheimer's dementia, amyloid angiopathy, and prostate cancer who was found unresponsive on his porch.  Patient was not administered IV t-PA secondary to high hemorrhage risk due to amyloid angiopathy  secondary to Alzheimer's disease.  The patient is not a suitable candidate for thrombolytic therapy, anticoagulation, or an extensive stroke workup due to advanced dementia and high risk for bleeding secondary to amyloid angiopathy.  Stroke:  Small acute infarcts in the medulla, left cerebellum, and posterior frontal lobes likely embolic secondary to amyloid angiopathy/small vessel disease  Resultant  no focal motor deficits but baseline dementia  CT head: stroke  MRI head: Small acute infarcts in the medulla, left cerebellum, andposterior frontal lobes.  Carotid Doppler: B ICA 1-39% stenosis, VAs antegrade  2D Echo: EF 60-65%. No source of embolus   LDL 66  HgbA1c 5.4  SCDs for VTE prophylaxis Diet regular Room service appropriate? Yes; Fluid consistency: Thin  No antithrombotic prior to admission, now on aspirin 325 mg daily  Patient counseled to be compliant with his antithrombotic medications  Ongoing aggressive stroke risk factor management  Therapy recommendations:  SNF Disposition:  SNF Hypertension  Stable Long-term BP goal normotensive  Diabetes  HgbA1c pending , goal < 7.0  Controlled  Other Stroke Risk Factors  Advanced age  Hx stroke/TIA  History of cancer  Other Active Problems  None  Hospital day # 3  I have personally examined this patient, reviewed notes, independently viewed imaging studies, participated in medical decision making and plan of care.ROS completed by me personally and pertinent positives fully documented  I have made any additions or clarifications directly to the above note. The patient has significant baseline dementia and sees Dr. Krista Blue in our office. He was found to have altered mental status in the setting of significant hypertension and urinary tract infection and sepsis. MRI scan shows multiple small infarcts but patient is not a good long-term candidate for anticoagulation given significant amyloid angiopathy and dementia  hence we will not pursue invasive cardiac workup. Continue aspirin and supportive care. Will likely need nursing home placement. Lond d/w brother who is HPOA and Dr Wynelle Cleveland at bedside and answered questionsGreater than 50% time during this 25 minute visit was spent for strokes. No family available at bedside. Discussed with Dr.Rizwan. Stroke team will sign off. Kindly call for questions. Respond hemorrhage  Antony Contras, MD Medical Director [place] Pager: [phone] 05/01/2017 3:44 PM   To contact Stroke Continuity provider, please refer to [email or web address] After hours, contact General Neurology

## 2017-05-01 NOTE — Evaluation (Signed)
Speech Language Pathology Evaluation Patient Details Name: [name] MRN: [id number] DOB: [birth year] Today's Date: 05/01/2017 Time: 9924-2683 SLP Time Calculation (min) (ACUTE ONLY): 26 min  Problem List:  Patient Active Problem List   Diagnosis Date Noted  . CVA (cerebral vascular accident) ([place]) 04/30/2017  . UTI (urinary tract infection) 04/28/2017  . Renal failure syndrome   . Syncope and collapse 04/27/2017  . Dehydration 04/27/2017  . Dementia 04/27/2017  . Rhabdomyolysis 04/27/2017  . Memory loss 02/21/2016  . Closed femur fracture ([place]) 07/03/2014  . Femur fracture, left ([place]) 07/03/2014  . Bradycardia 07/03/2014  . Abdominal pain, other specified site 10/14/2013  . Nausea & vomiting 10/14/2013   Past Medical History:  Past Medical History:  Diagnosis Date  . Glaucoma   . Memory loss   . Prostate cancer ([place]) 2004  . Tubular adenoma polyp of rectum 10/08/2013   Past Surgical History:  Past Surgical History:  Procedure Laterality Date  . FEMUR IM NAIL Left 07/05/2014   Procedure: INTRAMEDULLARY (IM) NAIL FEMORAL;  Surgeon: Marianna Payment, MD;  Location: [place];  Service: Orthopedics;  Laterality: Left;  . prostate seed implant  2004   HPI:  Pt is an 83 y.o.malewith history of Alzheimer'sdementia, amyloid angiopathy, and prostate cancer who was found unresponsive on his porch. Patient was not administered IV t-PA secondary to high hemorrhage risk due to amyloid angiopathy secondary to Alzheimer's disease. MRI showed smalla cute infarcts in the medulla, L cerebellum, and posterior frontal lobes. Family said that at baseline pt was living alone with aides coming in for 8 hours during the day and family assisting him in the evenings. He was home alone for small periods of time during the day and again overnight.   Assessment / Plan / Recommendation Clinical Impression  Pt with baseline cognitive deficits now presents with worsened impairments s/p acute CVA. He  is oriented to person and location only, and needs Mod cues for sustained attention and to reduce impulsivity with verbal tasks. Max cues were provided to attempt storage of new information and basic problem solving. PTA he was able to be alone at night and intermittently throughout the day, but I believe he will now require 24/7 supervision. SLP f/u recommended for cognitive strategies to maximize safety.     SLP Assessment  SLP Recommendation/Assessment: Patient needs continued Speech Lanaguage Pathology Services SLP Visit Diagnosis: Cognitive communication deficit (R41.841)    Follow Up Recommendations  Skilled Nursing facility (or [facility] SLP with 24/7 supervision pending PT/OT assessment)   Frequency and Duration min 2x/week  2 weeks      SLP Evaluation Cognition  Overall Cognitive Status: Impaired/Different from baseline Arousal/Alertness: Awake/alert Orientation Level: Oriented to person;Oriented to place;Disoriented to time;Disoriented to situation Attention: Sustained Sustained Attention: Impaired Sustained Attention Impairment: Verbal basic;Functional basic Memory: Impaired Memory Impairment: Storage deficit;Retrieval deficit;Decreased recall of new information Awareness: Impaired Awareness Impairment: Intellectual impairment;Emergent impairment;Anticipatory impairment Problem Solving: Impaired Problem Solving Impairment: Verbal basic;Functional basic Behaviors: Impulsive Safety/Judgment: Impaired       Comprehension  Auditory Comprehension Overall Auditory Comprehension: Impaired Commands: Impaired Two Step Basic Commands: 25-49% accurate Conversation: Simple    Expression Expression Primary Mode of Expression: Verbal Verbal Expression Overall Verbal Expression: Appears within functional limits for tasks assessed   Oral / Motor  Motor Speech Overall Motor Speech: Appears within functional limits for tasks assessed   GO                    Paiewonsky,  Mickel Baas 05/01/2017, 2:57 PM  Germain Osgood, M.A. CCC-SLP [phone]

## 2017-05-02 DIAGNOSIS — R4189 Other symptoms and signs involving cognitive functions and awareness: Secondary | ICD-10-CM

## 2017-05-02 DIAGNOSIS — R404 Transient alteration of awareness: Secondary | ICD-10-CM

## 2017-05-02 MED ORDER — ASPIRIN 325 MG PO TABS: 325.0000 mg | ORAL_TABLET | Freq: Every day | ORAL | 0 refills | Status: DC

## 2017-05-02 MED ORDER — CEFPODOXIME PROXETIL 200 MG PO TABS: 200.0000 mg | ORAL_TABLET | Freq: Two times a day (BID) | ORAL | Status: DC

## 2017-05-02 NOTE — Clinical Social Work Placement (Signed)
   CLINICAL SOCIAL WORK PLACEMENT  NOTE  Date:  05/02/2017  Patient Details  Name: [name] MRN: [id number] Date of Birth: [birth year]  Clinical Social Work is seeking post-discharge placement for this patient at the [place] level of care (*CSW will initial, date and re-position this form in  chart as items are completed):      Patient/family provided with [place] Work Department's list of facilities offering this level of care within the geographic area requested by the patient (or if unable, by the patient's family).  Yes   Patient/family informed of their freedom to choose among providers that offer the needed level of care, that participate in Medicare, Medicaid or managed care program needed by the patient, have an available bed and are willing to accept the patient.  Yes   Patient/family informed of [place]'s ownership interest in [facility] and [facility], as well as of the fact that they are under no obligation to receive care at these facilities.  PASRR submitted to EDS on       PASRR number received on       Existing PASRR number confirmed on       FL2 transmitted to all facilities in geographic area requested by pt/family on       FL2 transmitted to all facilities within larger geographic area on 05/01/17     Patient informed that his/her managed care company has contracts with or will negotiate with certain facilities, including the following:            Patient/family informed of bed offers received.  Patient [facility] bed at [facility]     Physician recommends and patient chooses bed at      Patient to be transferred to [facility] on 05/02/17.  Patient to be transferred to facility by [facility]     Patient family notified on 05/02/17 of transfer.  Name of family member notified:  Ted     PHYSICIAN Please sign FL2     Additional Comment:    _______________________________________________ Geralynn Ochs, LCSW 05/02/2017, 3:33 PM

## 2017-05-02 NOTE — Clinical Social Work Note (Signed)
Clinical Social Work Assessment  Patient Details  Name: [name] MRN: [id number] Date of Birth: 1931/12/08  Date of referral:  05/01/17               Reason for consult:  Facility Placement, Discharge Planning                Permission sought to share information with:  Facility Sport and exercise psychologist, Family Supports Permission granted to share information::  Yes, Verbal Permission Granted  Name::     Varney Daily, Polly, Clinical cytogeneticist  Agency::  SNF  Relationship::  Son, Insurance account manager, Sister-in-law, Copywriter, advertising Information:     Housing/Transportation Living arrangements for the past 2 months:  Single Family Home Source of Information:  Adult Children, Other (Comment Required) (Brother, Caregiver) Patient Interpreter Needed:  None Criminal Activity/Legal Involvement Pertinent to Current Situation/Hospitalization:  No - Comment as needed Significant Relationships:  Adult Children, Siblings, Other(Comment) (Caregiver) Lives with:  Self Do you feel safe going back to the place where you live?  Yes Need for family participation in patient care:  Yes (Comment) (patient has dementia; not oriented)  Care giving concerns:  Patient currently resides home alone, with a caregiver for support part-time during the week. Patient requires a higher level of care at this time in order to improve functional ability before returning back home.   Social Worker assessment / plan:  CSW introduced self to patient's son, Nicole Kindred, at bedside, patient's brother, [name], over the phone, and patient's caregiver, Vickii Chafe, at bedside to discuss role and recommendation for SNF placement. Patient's son indicated that patient had been at [facility] previously, and thought that the family wanted the patient to return there, but that his uncle was healthcare power of attorney. Patient's brother expressed desire for [facility] instead, to keep the patient in the [facility] family of facilities. CSW contacted [facility], and they do not have any beds available at this time. CSW contacted patient's brother back and explained that there would need to be a second option picked. Patient's brother requested [facility] as a second option. CSW contacted [facility], and they will be able to take the patient for SNF placement. CSW informed patient's brother. Patient's brother requested that CSW set up transport when the patient is ready to leave the hospital. [facility] will follow to facilitate discharge to [facility] today.  Employment status:  Retired Nurse, adult PT Recommendations:  [place] / Referral to community resources:  [place]  Patient/Family's Response to care:  Patient's brother is agreeable to SNF placement for the patient.  Patient/Family's Understanding of and Emotional Response to Diagnosis, Current Treatment, and Prognosis:  Patient's emotional understanding unable to be assessed due to dementia and disorientation. Patient's brother seemed frustrated that the patient wouldn't be able to go to [facility], and requested that the patient be kept until he could be admitted there. Patient's brother was understanding of picking a second option, and seemed to understand the need for the patient to start rehabilitation. Patient's brother indicated understanding of CSW role in discharge planning.  Emotional Assessment Appearance:  Appears stated age Attitude/Demeanor/Rapport:  Unable to Assess Affect (typically observed):  Unable to Assess Orientation:  Oriented to Self, Oriented to Place Alcohol / Substance use:  Not Applicable Psych involvement (Current and /or in the community):  No (Comment)  Discharge Needs  Concerns to be addressed:  Care Coordination, Discharge Planning Concerns Readmission within the last 30  days:  No Current discharge risk:  Lives alone, Physical Impairment, Cognitively Impaired Barriers to  Discharge:  No Barriers Identified   Geralynn Ochs, LCSW 05/02/2017, 2:19 PM

## 2017-05-02 NOTE — Discharge Summary (Signed)
Physician Discharge Summary  [name] HKV:[id number] DOB: Feb 01, 1932 DOA: 04/27/2017  PCP: Dione Housekeeper, MD  Admit date: 04/27/2017 Discharge date: 05/02/2017  Admitted From: home  Disposition:  SNF   Recommendations for Outpatient Follow-up:  1. Stop Vantin after tomorrow's dose    Discharge Condition:  stable   CODE STATUS:  DNR   Consultations:  Neurology    Discharge Diagnoses:  Principal Problem:   CVA (cerebral vascular accident) ([place]) Active Problems:   Syncope and collapse   Dehydration   Dementia   Rhabdomyolysis   UTI (urinary tract infection)   Renal failure syndrome    Subjective: No complaints today. No complaints of cough, chest pain, nausea, vomiting, constipation, diarrhea or dysuria. I have explained to him that he will be going to rehab and not home and is in agreement with this.   Brief Summary: 83 year old male with dementia who presented to [facility] after he was found unresponsive on his porch at home. He was hypotensive on admission with SBP in 70's which has improved with IV fluids. CT head showed no acute intracranial findings but pt does have evidence of prior cerebral infarction. He was also found to have UTI. Patient also had MRI of the brain which showed small acute infarcts in the medulla, left cerebellum and posterior frontal lobes. Patient was transferred from [facility] to [facility] for stroke workup   Hospital Course:  Principal Problem: CVA (cerebral vascular accident) (HCC)Presented as acute encephalopathy and syncopal episode - MRI of the brain showedsmall acute infarcts in the medulla, left cerebellum and posterior frontal lobes, advanced cerebral atrophy and chronic small vessel ischemic disease with chronic infarcts - Patient was transferred to [facility] for stroke workup, neurology consulted - per neurology, patient is not a good long-term candidate for anticoagulation given significant amyloid angiopathy and  dementia  - 2-D echo showed EF of 60-65%, no regional wall motion abnormalities -Carotid Dopplers showed moderate amount of left-sided death restarted block, minimal right-sided intimal wall thickening, no significant stenosis on both sides  - CTA shows no stenosis and a congenitally diminutive right vertebral artery. - LDL 66  - Aspirin 325 mg daily  - Lives alone and has a caretaker who is there for part of the day - based on PT/OT eval will need SNF  Active Problems:   Dementia - Continue Aricept    UTI (urinary tract infection)/ leukocytosis - on IV Rocephin- changed to Vantin for total of 7 days of antibiotics - urine culture showed multiple species and is unhelpful  Acute kidney injury likely due to dehydration Rhabdomyolysis  - Resolved with gentle hydration   Prostate cancer  Discharge Instructions  Discharge Instructions    Diet - low sodium heart healthy    Complete by:  As directed    Increase activity slowly    Complete by:  As directed      Allergies as of 05/02/2017   No Known Allergies     Medication List    TAKE these medications   aspirin 325 MG tablet Take 1 tablet (325 mg total) by mouth daily. Start taking on:  05/03/2017   cefpodoxime 200 MG tablet Commonly known as:  VANTIN Take 1 tablet (200 mg total) by mouth every 12 (twelve) hours.   donepezil 10 MG tablet Commonly known as:  ARICEPT Take 1 tablet (10 mg total) by mouth at bedtime.   furosemide 20 MG tablet Commonly known as:  LASIX Take 20 mg by  mouth daily as needed for fluid.   LUMIGAN 0.01 % Soln Generic drug:  bimatoprost Place 1 drop into both eyes at bedtime.   memantine 10 MG tablet Commonly known as:  NAMENDA Take 1 tablet (10 mg total) by mouth 2 (two) times daily.       No Known Allergies   Procedures/Studies: 2 D ECHO  Left ventricle: The cavity size was normal. Wall thickness was   normal. Systolic function was normal. The estimated ejection    fraction was in the range of 60% to 65%. Wall motion was normal;   there were no regional wall motion abnormalities. The study is   not technically sufficient to allow evaluation of LV diastolic   function. - Aortic valve: There was mild regurgitation. Valve area (VTI):   3.55 cm^2. Valve area (Vmax): 2.54 cm^2. - Technically adequate study.  Ct Angio Head W Or Wo Contrast  Result Date: 04/30/2017 CLINICAL DATA:  Acute encephalopathy and syncopal episode. Multiple small acute brainstem and posterior fossa infarcts. EXAM: CT ANGIOGRAPHY HEAD AND NECK TECHNIQUE: Multidetector CT imaging of the head and neck was performed using the standard protocol during bolus administration of intravenous contrast. Multiplanar CT image reconstructions and MIPs were obtained to evaluate the vascular anatomy. Carotid stenosis measurements (when applicable) are obtained utilizing NASCET criteria, using the distal internal carotid diameter as the denominator. CONTRAST:  50 mL Isovue 370 COMPARISON:  Brain MRI 04/28/2017 FINDINGS: CT HEAD FINDINGS Brain: No hemorrhage or mass effect. There is faint hypoattenuation at the sites of diffusion restriction described on recent MRI. There is periventricular hypoattenuation compatible with chronic microvascular disease. Vascular: No hyperdense vessel or unexpected calcification. Skull: Normal visualized skull base, calvarium and extracranial soft tissues. Sinuses/Orbits: No sinus fluid levels or advanced mucosal thickening. No mastoid effusion. Normal orbits. CTA NECK FINDINGS Aortic arch: There is no aneurysm or dissection of the visualized ascending aorta or aortic arch. There is a normal 3 vessel branching pattern. The visualized proximal subclavian arteries are normal. Right carotid system: The right common carotid origin is widely patent. There is no common carotid or internal carotid artery dissection or aneurysm. No hemodynamically significant stenosis. Left carotid system: The  left common carotid origin is widely patent. There is no common carotid or internal carotid artery dissection or aneurysm. No hemodynamically significant stenosis. Vertebral arteries: The vertebral system is left dominant. There is calcification in the left P1 segment. The right vertebral artery is diminutive along its entire course. There is no flow demonstrated in the right V4 segment. Skeleton: There is no bony spinal canal stenosis. No lytic or blastic lesions. Other neck: The nasopharynx is clear. The oropharynx and hypopharynx are normal. The epiglottis is normal. The supraglottic larynx, glottis and subglottic larynx are normal. No retropharyngeal collection. The parapharyngeal spaces are preserved. The parotid and submandibular glands are normal. No sialolithiasis or salivary ductal dilatation. The thyroid gland is normal. There is no cervical lymphadenopathy. Upper chest: There is ground-glass opacity in the posterior right upper lobe. Small bilateral pleural effusions with associated atelectasis. Review of the MIP images confirms the above findings CTA HEAD FINDINGS Anterior circulation: --Intracranial internal carotid arteries: Normal. --Anterior cerebral arteries: Normal. --Middle cerebral arteries: Normal. --Posterior communicating arteries: Present on the right. Posterior circulation: --Posterior cerebral arteries: Normal. --Superior cerebellar arteries: Normal. --Basilar artery: Normal. --Anterior inferior cerebellar arteries: Normal. --Posterior inferior cerebellar arteries: Left dominant. Venous sinuses: As permitted by contrast timing, patent. Anatomic variants: None Delayed phase: No parenchymal contrast enhancement. Review of  the MIP images confirms the above findings IMPRESSION: 1. No emergent large vessel occlusion. 2. No hemodynamically significant stenosis of the major cervical arteries. 3. Congenitally diminutive right vertebral artery. Electronically Signed   By: Ulyses Jarred M.D.   On:  04/30/2017 21:22   Dg Chest 1 View  Result Date: 04/27/2017 CLINICAL DATA:  Found unresponsive. EXAM: CHEST 1 VIEW COMPARISON:  Chest x-ray dated 12/25/2007. FINDINGS: Heart size is upper normal. Overall cardiomediastinal silhouette is stable in size and configuration. Lungs are clear. No pleural effusion or pneumothorax seen. Osseous and soft tissue structures about the chest are unremarkable. IMPRESSION: No active disease. Electronically Signed   By: Franki Cabot M.D.   On: 04/27/2017 20:50   Ct Head Wo Contrast  Result Date: 04/27/2017 CLINICAL DATA:  Found unresponsive earlier today. Low blood pressure. Partial resuscitation. EXAM: CT HEAD WITHOUT CONTRAST TECHNIQUE: Contiguous axial images were obtained from the base of the skull through the vertex without intravenous contrast. COMPARISON:  03/30/2015 brain MR. FINDINGS: Brain: No evidence for acute stroke, acute hemorrhage, mass lesion, or extra-axial fluid. There is extreme atrophy, with hydrocephalus ex vacuo. Extensive involvement of the white matter by hypoattenuation, representing chronic microvascular ischemic change. There is an old RIGHT thalamus lacunar infarct. Vascular: No hyperdense vessel or unexpected calcification. Skull: Normal. Negative for fracture or focal lesion. Sinuses/Orbits: No acute findings or layering fluid. BILATERAL cataract extraction. Other: None.  Compared with prior brain MR, similar appearance. IMPRESSION: Severe atrophy and extensive white matter disease. Evidence for chronic cerebral infarction. No acute intracranial findings are evident. Electronically Signed   By: Staci Righter M.D.   On: 04/27/2017 20:22   Ct Angio Neck W Or Wo Contrast  Result Date: 04/30/2017 CLINICAL DATA:  Acute encephalopathy and syncopal episode. Multiple small acute brainstem and posterior fossa infarcts. EXAM: CT ANGIOGRAPHY HEAD AND NECK TECHNIQUE: Multidetector CT imaging of the head and neck was performed using the standard  protocol during bolus administration of intravenous contrast. Multiplanar CT image reconstructions and MIPs were obtained to evaluate the vascular anatomy. Carotid stenosis measurements (when applicable) are obtained utilizing NASCET criteria, using the distal internal carotid diameter as the denominator. CONTRAST:  50 mL Isovue 370 COMPARISON:  Brain MRI 04/28/2017 FINDINGS: CT HEAD FINDINGS Brain: No hemorrhage or mass effect. There is faint hypoattenuation at the sites of diffusion restriction described on recent MRI. There is periventricular hypoattenuation compatible with chronic microvascular disease. Vascular: No hyperdense vessel or unexpected calcification. Skull: Normal visualized skull base, calvarium and extracranial soft tissues. Sinuses/Orbits: No sinus fluid levels or advanced mucosal thickening. No mastoid effusion. Normal orbits. CTA NECK FINDINGS Aortic arch: There is no aneurysm or dissection of the visualized ascending aorta or aortic arch. There is a normal 3 vessel branching pattern. The visualized proximal subclavian arteries are normal. Right carotid system: The right common carotid origin is widely patent. There is no common carotid or internal carotid artery dissection or aneurysm. No hemodynamically significant stenosis. Left carotid system: The left common carotid origin is widely patent. There is no common carotid or internal carotid artery dissection or aneurysm. No hemodynamically significant stenosis. Vertebral arteries: The vertebral system is left dominant. There is calcification in the left P1 segment. The right vertebral artery is diminutive along its entire course. There is no flow demonstrated in the right V4 segment. Skeleton: There is no bony spinal canal stenosis. No lytic or blastic lesions. Other neck: The nasopharynx is clear. The oropharynx and hypopharynx are normal. The epiglottis is normal.  The supraglottic larynx, glottis and subglottic larynx are normal. No  retropharyngeal collection. The parapharyngeal spaces are preserved. The parotid and submandibular glands are normal. No sialolithiasis or salivary ductal dilatation. The thyroid gland is normal. There is no cervical lymphadenopathy. Upper chest: There is ground-glass opacity in the posterior right upper lobe. Small bilateral pleural effusions with associated atelectasis. Review of the MIP images confirms the above findings CTA HEAD FINDINGS Anterior circulation: --Intracranial internal carotid arteries: Normal. --Anterior cerebral arteries: Normal. --Middle cerebral arteries: Normal. --Posterior communicating arteries: Present on the right. Posterior circulation: --Posterior cerebral arteries: Normal. --Superior cerebellar arteries: Normal. --Basilar artery: Normal. --Anterior inferior cerebellar arteries: Normal. --Posterior inferior cerebellar arteries: Left dominant. Venous sinuses: As permitted by contrast timing, patent. Anatomic variants: None Delayed phase: No parenchymal contrast enhancement. Review of the MIP images confirms the above findings IMPRESSION: 1. No emergent large vessel occlusion. 2. No hemodynamically significant stenosis of the major cervical arteries. 3. Congenitally diminutive right vertebral artery. Electronically Signed   By: Ulyses Jarred M.D.   On: 04/30/2017 21:22   Mr Brain Wo Contrast  Result Date: 04/28/2017 CLINICAL DATA:  Found unresponsive. Syncope and collapse. Prior stroke. EXAM: MRI HEAD WITHOUT CONTRAST TECHNIQUE: Multiplanar, multiecho pulse sequences of the brain and surrounding structures were obtained without intravenous contrast. COMPARISON:  Head CT 04/27/2017 and MRI 03/30/2015 FINDINGS: Brain: Small acute infarcts are present in the right medulla and posteroinferior left cerebellar hemisphere. There are also punctate acute cortical infarcts in the high posterior frontal lobes bilaterally. Scattered chronic cerebral and cerebellar microhemorrhages are again seen.  A small chronic right occipital cortical infarct is new or larger than on the prior MRI. There also may be a tiny left occipital chronic cortical infarct. Patchy to confluent cerebral white matter T2 hyperintensities are similar to the prior MRI and nonspecific but compatible with extensive chronic small vessel ischemic disease. There are numerous small chronic infarcts in the cerebellum bilaterally. Chronic lacunar infarcts are also again seen in the anterior left centrum semiovale and right thalamus. There is marked cerebral atrophy. No mass, midline shift, or extra-axial fluid collection is seen. Vascular: Unchanged poor visualization of the distal right vertebral artery, possibly occluded. Dominant appearing left vertebral artery with grossly preserved vascular flow voids elsewhere. Skull and upper cervical spine: Unremarkable bone marrow signal. Sinuses/Orbits: Bilateral cataract extraction. Paranasal sinuses and mastoid air cells are clear. Other: None. IMPRESSION: 1. 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 2. Advanced cerebral atrophy and chronic small vessel ischemic disease with chronic infarcts as above. Electronically Signed   By: Logan Bores M.D.   On: 04/28/2017 12:13   US Carotid Bilateral (at [facility] Only)  Result Date: 04/29/2017 CLINICAL DATA:  Stroke.  Syncopal episode. EXAM: BILATERAL CAROTID DUPLEX ULTRASOUND TECHNIQUE: Pearline Cables scale imaging, color Doppler and duplex ultrasound were performed of bilateral carotid and vertebral arteries in the neck. COMPARISON:  None. FINDINGS: Criteria: Quantification of carotid stenosis is based on velocity parameters that correlate the residual internal carotid diameter with NASCET-based stenosis levels, using the diameter of the distal internal carotid lumen as the denominator for stenosis measurement. The following velocity measurements were obtained: RIGHT ICA:  45/6 cm/sec CCA:  332/95 cm/sec SYSTOLIC ICA/CCA  RATIO:  0.4 DIASTOLIC ICA/CCA RATIO:  0.4 ECA:  105 cm/sec LEFT ICA:  78/6 cm/sec CCA:  188/41 cm/sec SYSTOLIC ICA/CCA RATIO:  0.7 DIASTOLIC ICA/CCA RATIO:  0.6 ECA:  102 cm/sec RIGHT CAROTID ARTERY: There is a very minimal  amount of intimal thickening within the right carotid bulb (image 17)), extending to involve the origin and proximal aspects of the right internal carotid artery (image 25), not resulting in elevated peak systolic velocities within the interrogated course the right internal carotid artery to suggest a hemodynamically significant stenosis. RIGHT VERTEBRAL ARTERY:  Antegrade flow LEFT CAROTID ARTERY: There is a moderate amount of eccentric mixed echogenic plaque within the left carotid bulb (images 49 and 51), not resulting in elevated peak systolic velocities within the interrogated course of the left internal carotid artery to suggest a hemodynamically significant stenosis. LEFT VERTEBRAL ARTERY:  Antegrade flow IMPRESSION: 1. Moderate amount of left-sided atherosclerotic plaque, not resulting in a hemodynamically significant stenosis. 2. Minimal amount of right-sided intimal wall thickening, not resulting in a hemodynamically significant stenosis. Electronically Signed   By: Sandi Mariscal M.D.   On: 04/29/2017 11:56       Discharge Exam: Vitals:   05/02/17 0747 05/02/17 0933  BP: 90/70 140/62  Pulse:  (!) 48  Resp:  18  Temp:  97.9 F (36.6 C)   Vitals:   05/02/17 0028 05/02/17 0500 05/02/17 0747 05/02/17 0933  BP: (!) 172/71 (!) 170/78 90/70 140/62  Pulse: (!) 48 (!) 47  (!) 48  Resp: 18 18  18   Temp: 98.4 F (36.9 C) 97.6 F (36.4 C)  97.9 F (36.6 C)  TempSrc: Axillary Oral  Oral  SpO2: 100% 100%  98%  Weight:      Height:        General: Pt is alert, awake, not in acute distress Cardiovascular: RRR, S1/S2 +, no rubs, no gallops Respiratory: CTA bilaterally, no wheezing, no rhonchi Abdominal: Soft, NT, ND, bowel sounds + Extremities: no edema, no  cyanosis    The results of significant diagnostics from this hospitalization (including imaging, microbiology, ancillary and laboratory) are listed below for reference.     Microbiology: Recent Results (from the past 240 hour(s))  Urine culture     Status: Abnormal   Collection Time: 04/27/17  7:51 PM  Result Value Ref Range Status   Specimen Description URINE, CLEAN CATCH  Final   Special Requests NONE  Final   Culture MULTIPLE SPECIES PRESENT, SUGGEST RECOLLECTION (A)  Final   Report Status 04/30/2017 FINAL  Final     Labs: BNP (last 3 results) No results for input(s): BNP in the last 8760 hours. Basic Metabolic Panel:  Recent Labs Lab 04/27/17 2035 04/28/17 0616 04/29/17 0821  NA 141 141 139  K 4.4 3.6 3.7  CL 107 109 110  CO2 25 23 21*  GLUCOSE 128* 133* 93  BUN 20 16 11   CREATININE 1.38* 0.91 0.68  CALCIUM 9.1 8.8* 8.3*   Liver Function Tests:  Recent Labs Lab 04/27/17 2035  AST 27  ALT 23  ALKPHOS 72  BILITOT 1.3*  PROT 6.8  ALBUMIN 3.7   No results for input(s): LIPASE, AMYLASE in the last 168 hours. No results for input(s): AMMONIA in the last 168 hours. CBC:  Recent Labs Lab 04/27/17 2035 04/28/17 0616 04/29/17 0821 04/30/17 0508  WBC 12.2* 11.5* 7.5 9.3  NEUTROABS 11.0*  --   --   --   HGB 12.9* 12.8* 11.8* 11.4*  HCT 39.1 38.2* 35.2* 34.8*  MCV 93.5 92.5 92.1 91.3  PLT 267 266 245 236   Cardiac Enzymes:  Recent Labs Lab 04/27/17 2035 04/28/17 0616 04/29/17 0821  CKTOTAL 732* 1,912* 567*  TROPONINI <0.03  --   --  BNP: Invalid input(s): POCBNP CBG: No results for input(s): GLUCAP in the last 168 hours. D-Dimer No results for input(s): DDIMER in the last 72 hours. Hgb A1c  Recent Labs  04/30/17 0508  HGBA1C 5.4   Lipid Profile  Recent Labs  04/30/17 0508  CHOL 139  HDL 63  LDLCALC 66  TRIG 51  CHOLHDL 2.2   Thyroid function studies No results for input(s): TSH, T4TOTAL, T3FREE, THYROIDAB in the last 72  hours.  Invalid input(s): FREET3 Anemia work up No results for input(s): VITAMINB12, FOLATE, FERRITIN, TIBC, IRON, RETICCTPCT in the last 72 hours. Urinalysis    Component Value Date/Time   COLORURINE YELLOW 04/27/2017 1951   APPEARANCEUR HAZY (A) 04/27/2017 1951   LABSPEC 1.014 04/27/2017 1951   PHURINE 5.0 04/27/2017 1951   GLUCOSEU NEGATIVE 04/27/2017 1951   HGBUR MODERATE (A) 04/27/2017 1951   BILIRUBINUR NEGATIVE 04/27/2017 1951   KETONESUR 5 (A) 04/27/2017 1951   PROTEINUR 30 (A) 04/27/2017 1951   UROBILINOGEN 1.0 12/22/2007 1456   NITRITE NEGATIVE 04/27/2017 1951   LEUKOCYTESUR MODERATE (A) 04/27/2017 1951   Sepsis Labs Invalid input(s): PROCALCITONIN,  WBC,  LACTICIDVEN Microbiology Recent Results (from the past 240 hour(s))  Urine culture     Status: Abnormal   Collection Time: 04/27/17  7:51 PM  Result Value Ref Range Status   Specimen Description URINE, CLEAN CATCH  Final   Special Requests NONE  Final   Culture MULTIPLE SPECIES PRESENT, SUGGEST RECOLLECTION (A)  Final   Report Status 04/30/2017 FINAL  Final     Time coordinating discharge: Over 30 minutes  SIGNED:   Debbe Odea, MD  [facility] 05/02/2017, 1:29 PM Pager   If 7PM-7AM, please contact night-coverage [email or web address] Password [email or web address]

## 2017-05-02 NOTE — Care Management Important Message (Signed)
Important Message  Patient Details  Name: [name] MRN: [id number] Date of Birth: [birth year]   Medicare Important Message Given:  Yes    Koya Hunger [address] 05/02/2017, 1:58 PM

## 2017-05-02 NOTE — Progress Notes (Signed)
Pt d/c to skilled nursing facility [facility]). No new concerns, pt is transported by [facility]. Nurse called [place] to give report to receiving nurse. Nurse was put on hold, until line went off, nurse called back and spoke to [place] who said she did not have any paper work on the patient but will put nurse through to [place], phone was on hold until the line went off.

## 2017-05-02 NOTE — Progress Notes (Addendum)
Discharge to: [facility] Anticipated discharge date: 05/02/17 Family notified: Yes, via phone Transportation by: [facility]  Report #: [phone], Room 300, ask for 300 hall nurse  CSW signing off.  Laveda Abbe LCSW [phone]

## 2017-05-06 ENCOUNTER — Emergency Department (HOSPITAL_COMMUNITY): Payer: Medicare Other

## 2017-05-06 ENCOUNTER — Encounter (HOSPITAL_COMMUNITY): Payer: Self-pay | Admitting: *Deleted

## 2017-05-06 ENCOUNTER — Inpatient Hospital Stay (HOSPITAL_COMMUNITY)
Admission: EM | Admit: 2017-05-06 | Discharge: 2017-05-14 | DRG: 177 | Disposition: A | Discharge status: Skilled Nursing Facility | Payer: Medicare Other | Attending: Internal Medicine | Admitting: Internal Medicine

## 2017-05-06 DIAGNOSIS — Z515 Encounter for palliative care: Secondary | ICD-10-CM

## 2017-05-06 DIAGNOSIS — Z79899 Other long term (current) drug therapy: Secondary | ICD-10-CM

## 2017-05-06 DIAGNOSIS — R001 Bradycardia, unspecified: Secondary | ICD-10-CM | POA: Diagnosis not present

## 2017-05-06 DIAGNOSIS — Z66 Do not resuscitate: Secondary | ICD-10-CM | POA: Diagnosis present

## 2017-05-06 DIAGNOSIS — I6300 Cerebral infarction due to thrombosis of unspecified precerebral artery: Secondary | ICD-10-CM | POA: Diagnosis not present

## 2017-05-06 DIAGNOSIS — R1310 Dysphagia, unspecified: Secondary | ICD-10-CM | POA: Diagnosis present

## 2017-05-06 DIAGNOSIS — F0150 Vascular dementia without behavioral disturbance: Secondary | ICD-10-CM | POA: Diagnosis present

## 2017-05-06 DIAGNOSIS — I69398 Other sequelae of cerebral infarction: Secondary | ICD-10-CM

## 2017-05-06 DIAGNOSIS — I680 Cerebral amyloid angiopathy: Secondary | ICD-10-CM | POA: Diagnosis present

## 2017-05-06 DIAGNOSIS — R1110 Vomiting, unspecified: Secondary | ICD-10-CM | POA: Diagnosis not present

## 2017-05-06 DIAGNOSIS — F0390 Unspecified dementia without behavioral disturbance: Secondary | ICD-10-CM | POA: Diagnosis present

## 2017-05-06 DIAGNOSIS — R7881 Bacteremia: Secondary | ICD-10-CM | POA: Diagnosis present

## 2017-05-06 DIAGNOSIS — I959 Hypotension, unspecified: Secondary | ICD-10-CM | POA: Diagnosis present

## 2017-05-06 DIAGNOSIS — G9341 Metabolic encephalopathy: Secondary | ICD-10-CM

## 2017-05-06 DIAGNOSIS — J690 Pneumonitis due to inhalation of food and vomit: Principal | ICD-10-CM | POA: Diagnosis present

## 2017-05-06 DIAGNOSIS — I639 Cerebral infarction, unspecified: Secondary | ICD-10-CM | POA: Diagnosis present

## 2017-05-06 DIAGNOSIS — E854 Organ-limited amyloidosis: Secondary | ICD-10-CM | POA: Diagnosis present

## 2017-05-06 DIAGNOSIS — T68XXXA Hypothermia, initial encounter: Secondary | ICD-10-CM | POA: Diagnosis present

## 2017-05-06 DIAGNOSIS — Z8546 Personal history of malignant neoplasm of prostate: Secondary | ICD-10-CM

## 2017-05-06 DIAGNOSIS — K920 Hematemesis: Secondary | ICD-10-CM | POA: Diagnosis present

## 2017-05-06 DIAGNOSIS — Z7189 Other specified counseling: Secondary | ICD-10-CM

## 2017-05-06 DIAGNOSIS — R0989 Other specified symptoms and signs involving the circulatory and respiratory systems: Secondary | ICD-10-CM

## 2017-05-06 DIAGNOSIS — Z7982 Long term (current) use of aspirin: Secondary | ICD-10-CM

## 2017-05-06 HISTORY — DX: Cerebral infarction, unspecified: I63.9

## 2017-05-06 LAB — PROTIME-INR
INR: 0.91
Prothrombin Time: 12.2 seconds (ref 11.4–15.2)

## 2017-05-06 LAB — URINALYSIS, ROUTINE W REFLEX MICROSCOPIC
BILIRUBIN URINE: NEGATIVE
Glucose, UA: NEGATIVE mg/dL
Hgb urine dipstick: NEGATIVE
Ketones, ur: NEGATIVE mg/dL
Leukocytes, UA: NEGATIVE
NITRITE: NEGATIVE
PH: 6 (ref 5.0–8.0)
Protein, ur: NEGATIVE mg/dL
SPECIFIC GRAVITY, URINE: 1.012 (ref 1.005–1.030)

## 2017-05-06 LAB — CBC WITH DIFFERENTIAL/PLATELET
BASOS ABS: 0 10*3/uL (ref 0.0–0.1)
BASOS PCT: 0 %
Eosinophils Absolute: 0 10*3/uL (ref 0.0–0.7)
Eosinophils Relative: 1 %
HEMATOCRIT: 37.6 % — AB (ref 39.0–52.0)
HEMOGLOBIN: 12.9 g/dL — AB (ref 13.0–17.0)
Lymphocytes Relative: 13 %
Lymphs Abs: 0.8 10*3/uL (ref 0.7–4.0)
MCH: 31.4 pg (ref 26.0–34.0)
MCHC: 34.3 g/dL (ref 30.0–36.0)
MCV: 91.5 fL (ref 78.0–100.0)
MONO ABS: 0.5 10*3/uL (ref 0.1–1.0)
Monocytes Relative: 8 %
NEUTROS ABS: 5 10*3/uL (ref 1.7–7.7)
NEUTROS PCT: 78 %
Platelets: 269 10*3/uL (ref 150–400)
RBC: 4.11 MIL/uL — AB (ref 4.22–5.81)
RDW: 15.1 % (ref 11.5–15.5)
WBC: 6.4 10*3/uL (ref 4.0–10.5)

## 2017-05-06 LAB — COMPREHENSIVE METABOLIC PANEL
ALK PHOS: 82 U/L (ref 38–126)
ALT: 64 U/L — ABNORMAL HIGH (ref 17–63)
ANION GAP: 6 (ref 5–15)
AST: 44 U/L — ABNORMAL HIGH (ref 15–41)
Albumin: 3.3 g/dL — ABNORMAL LOW (ref 3.5–5.0)
BILIRUBIN TOTAL: 0.6 mg/dL (ref 0.3–1.2)
BUN: 19 mg/dL (ref 6–20)
CALCIUM: 9.1 mg/dL (ref 8.9–10.3)
CO2: 27 mmol/L (ref 22–32)
Chloride: 104 mmol/L (ref 101–111)
Creatinine, Ser: 0.75 mg/dL (ref 0.61–1.24)
Glucose, Bld: 112 mg/dL — ABNORMAL HIGH (ref 65–99)
POTASSIUM: 3.7 mmol/L (ref 3.5–5.1)
Sodium: 137 mmol/L (ref 135–145)
TOTAL PROTEIN: 6.6 g/dL (ref 6.5–8.1)

## 2017-05-06 LAB — LACTIC ACID, PLASMA
Lactic Acid, Venous: 0.8 mmol/L (ref 0.5–1.9)
Lactic Acid, Venous: 0.6 mmol/L (ref 0.5–1.9)

## 2017-05-06 LAB — TSH: TSH: 3.434 u[IU]/mL (ref 0.350–4.500)

## 2017-05-06 LAB — LIPASE, BLOOD: Lipase: 25 U/L (ref 11–51)

## 2017-05-06 LAB — PROCALCITONIN: Procalcitonin: <0.1 ng/mL

## 2017-05-06 MED ORDER — LATANOPROST 0.005 % OP SOLN
1.0000 [drp] | Freq: Every day | OPHTHALMIC | Status: DC
  Administered 2017-05-07 – 2017-05-13 (×8): 1 [drp] via OPHTHALMIC
  Filled 2017-05-06: qty 2.5

## 2017-05-06 MED ORDER — ACETAMINOPHEN 325 MG PO TABS: 650.0000 mg | ORAL_TABLET | Freq: Four times a day (QID) | ORAL | Status: DC | PRN

## 2017-05-06 MED ORDER — MEMANTINE HCL 10 MG PO TABS
10.0000 mg | ORAL_TABLET | Freq: Two times a day (BID) | ORAL | Status: DC
  Administered 2017-05-07 – 2017-05-14 (×16): 10 mg via ORAL
  Filled 2017-05-06 (×16): qty 1

## 2017-05-06 MED ORDER — PIPERACILLIN-TAZOBACTAM 3.375 G IVPB 30 MIN
3.3750 g | Freq: Once | INTRAVENOUS | Status: AC
  Administered 2017-05-06: 3.375 g via INTRAVENOUS
  Filled 2017-05-06: qty 50

## 2017-05-06 MED ORDER — VANCOMYCIN HCL IN DEXTROSE 750-5 MG/150ML-% IV SOLN
750.0000 mg | Freq: Two times a day (BID) | INTRAVENOUS | Status: DC
  Administered 2017-05-07: 750 mg via INTRAVENOUS
  Filled 2017-05-06 (×3): qty 150

## 2017-05-06 MED ORDER — DONEPEZIL HCL 5 MG PO TABS
10.0000 mg | ORAL_TABLET | Freq: Every day | ORAL | Status: DC
  Administered 2017-05-07 – 2017-05-13 (×8): 10 mg via ORAL
  Filled 2017-05-06 (×8): qty 2

## 2017-05-06 MED ORDER — LATANOPROST 0.005 % OP SOLN
OPHTHALMIC | Status: AC
  Filled 2017-05-06: qty 2.5

## 2017-05-06 MED ORDER — ENOXAPARIN SODIUM 40 MG/0.4ML ~~LOC~~ SOLN
40.0000 mg | SUBCUTANEOUS | Status: DC
  Administered 2017-05-07 – 2017-05-13 (×8): 40 mg via SUBCUTANEOUS
  Filled 2017-05-06 (×8): qty 0.4

## 2017-05-06 MED ORDER — VANCOMYCIN HCL IN DEXTROSE 1-5 GM/200ML-% IV SOLN
1000.0000 mg | Freq: Once | INTRAVENOUS | Status: AC
  Administered 2017-05-06: 1000 mg via INTRAVENOUS
  Filled 2017-05-06: qty 200

## 2017-05-06 MED ORDER — ONDANSETRON HCL 4 MG/2ML IJ SOLN
4.0000 mg | Freq: Four times a day (QID) | INTRAMUSCULAR | Status: DC | PRN
  Administered 2017-05-08 – 2017-05-09 (×2): 4 mg via INTRAVENOUS
  Filled 2017-05-06 (×2): qty 2

## 2017-05-06 MED ORDER — ACETAMINOPHEN 650 MG RE SUPP
650.0000 mg | Freq: Four times a day (QID) | RECTAL | Status: DC | PRN
Start: 2017-05-06 — End: 2017-05-14

## 2017-05-06 MED ORDER — ASPIRIN 325 MG PO TABS
325.0000 mg | ORAL_TABLET | Freq: Every day | ORAL | Status: DC
  Administered 2017-05-07 – 2017-05-14 (×8): 325 mg via ORAL
  Filled 2017-05-06 (×8): qty 1

## 2017-05-06 MED ORDER — PIPERACILLIN-TAZOBACTAM 3.375 G IVPB
3.3750 g | Freq: Three times a day (TID) | INTRAVENOUS | Status: DC
  Administered 2017-05-07 – 2017-05-10 (×11): 3.375 g via INTRAVENOUS
  Filled 2017-05-06 (×11): qty 50

## 2017-05-06 MED ORDER — SODIUM CHLORIDE 0.9 % IV SOLN
1000.0000 mL | INTRAVENOUS | Status: DC
  Administered 2017-05-06 – 2017-05-10 (×5): 1000 mL via INTRAVENOUS

## 2017-05-06 MED ORDER — DOCUSATE SODIUM 100 MG PO CAPS
100.0000 mg | ORAL_CAPSULE | Freq: Two times a day (BID) | ORAL | Status: DC
  Administered 2017-05-07 – 2017-05-12 (×12): 100 mg via ORAL
  Filled 2017-05-06 (×13): qty 1

## 2017-05-06 MED ORDER — VANCOMYCIN HCL IN DEXTROSE 750-5 MG/150ML-% IV SOLN
INTRAVENOUS | Status: AC
  Filled 2017-05-06: qty 150

## 2017-05-06 MED ORDER — SODIUM CHLORIDE 0.9 % IV BOLUS (SEPSIS)
500.0000 mL | Freq: Once | INTRAVENOUS | Status: AC
  Administered 2017-05-06: 500 mL via INTRAVENOUS

## 2017-05-06 MED ORDER — ONDANSETRON HCL 4 MG PO TABS: 4.0000 mg | ORAL_TABLET | Freq: Four times a day (QID) | ORAL | Status: DC | PRN

## 2017-05-06 NOTE — ED Provider Notes (Signed)
[facility] DEPT Provider Note   CSN: [id number] Arrival date & time: 05/06/17  1732     History   Chief Complaint Chief Complaint  Patient presents with  . Emesis    HPI [name] is a 83 y.o. male.  Patient brought in from [facility] facility for evaluation. Facility stated that patient had what episode of coffee-ground emesis. Patient has a history of dementia that's been worsened by recent stroke. Patient is a DO NOT RESUSCITATE. Patient was admitted here on June 23 for heat exposure and altered mental status. Patient arrived unresponsive. He was admitted here and [facility] for neurology services when it was determined that he has had multiple small strokes. [facility] was [facility] in [facility] facility. Upon arrival here we noted that he had severe hypothermia. Temperature of 31.7C. However patient was alert and would talk some. Now family members stated that this is his baseline since the strokes that occurred.      Past Medical History:  Diagnosis Date  . Glaucoma   . Memory loss   . Prostate cancer ([place]) 2004  . Tubular adenoma polyp of rectum 10/08/2013    Patient Active Problem List   Diagnosis Date Noted  . Episode of unresponsiveness   . CVA (cerebral vascular accident) ([place]) 04/30/2017  . UTI (urinary tract infection) 04/28/2017  . Renal failure syndrome   . Syncope and collapse 04/27/2017  . Dehydration 04/27/2017  . Dementia 04/27/2017  . Rhabdomyolysis 04/27/2017  . Memory loss 02/21/2016  . Closed femur fracture ([place]) 07/03/2014  . Femur fracture, left ([place]) 07/03/2014  . Bradycardia 07/03/2014  . Abdominal pain, other specified site 10/14/2013  . Nausea & vomiting 10/14/2013    Past Surgical History:  Procedure Laterality Date  . FEMUR IM NAIL Left 07/05/2014   Procedure: INTRAMEDULLARY (IM) NAIL FEMORAL;  Surgeon: Marianna Payment, MD;  Location: [facility];  Service: Orthopedics;  Laterality:  Left;  . prostate seed implant  2004       Home Medications    Prior to Admission medications   Medication Sig Start Date End Date Taking? Authorizing Provider  aspirin 325 MG tablet Take 1 tablet (325 mg total) by mouth daily. 05/03/17  Yes Debbe Odea, MD  Cholecalciferol (VITAMIN D) 2000 units CAPS Take 1 capsule by mouth daily.   Yes [provider]  donepezil (ARICEPT) 10 MG tablet Take 1 tablet (10 mg total) by mouth at bedtime. 08/22/16  Yes Dennie Bible, NP  furosemide (LASIX) 20 MG tablet Take 20 mg by mouth daily as needed for fluid.  01/21/17  Yes [provider]  latanoprost (XALATAN) 0.005 % ophthalmic solution Place 1 drop into both eyes at bedtime.   Yes [provider]  memantine (NAMENDA) 10 MG tablet Take 1 tablet (10 mg total) by mouth 2 (two) times daily. 08/22/16  Yes Dennie Bible, NP  cefpodoxime (VANTIN) 200 MG tablet Take 1 tablet (200 mg total) by mouth every 12 (twelve) hours. 05/02/17   Debbe Odea, MD    Family History Family History  Problem Relation Age of Onset  . Ovarian cancer Mother   . Heart disease Father   . Aneurysm Father     Social History Social History  Substance Use Topics  . Smoking status: Never Smoker  . Smokeless tobacco: Never Used  . Alcohol use No     Comment: 1 drink a month     Allergies  Patient has no known allergies.   Review of Systems Review of Systems  Unable to perform ROS: Dementia     Physical Exam Updated Vital Signs BP 110/63 (BP Location: Left Arm)   Pulse (!) 45   Temp (!) 91.3 F (32.9 C) (Rectal)   Resp 13   Ht 1.778 m (5\' 10" )   Wt 65.8 kg (145 lb)   SpO2 98%   BMI 20.81 kg/m   Physical Exam  Constitutional: He appears well-developed and well-nourished. No distress.  HENT:  Head: Normocephalic and atraumatic.  Mouth/Throat: Oropharynx is clear and moist.  Eyes: Conjunctivae and EOM are normal. Pupils are equal, round, and reactive to  light.  Neck: Normal range of motion. Neck supple.  Cardiovascular: Regular rhythm.   Bradycardic  Pulmonary/Chest: Effort normal and breath sounds normal. No respiratory distress.  Abdominal: Soft. Bowel sounds are normal. There is no tenderness.  Musculoskeletal: Normal range of motion.  Neurological: He is alert. No cranial nerve deficit. He exhibits normal muscle tone. Coordination normal.  Patient is awake alert family says baseline mental status he will verbalize some. He will follow some commands. Does state that he certain baseline following his strokes.  Skin:  Cold to the touch  Nursing note and vitals reviewed.    ED Treatments / Results  Labs (all labs ordered are listed, but only abnormal results are displayed) Labs Reviewed  COMPREHENSIVE METABOLIC PANEL - Abnormal; Notable for the following:       Result Value   Glucose, Bld 112 (*)    Albumin 3.3 (*)    AST 44 (*)    ALT 64 (*)    All other components within normal limits  CBC WITH DIFFERENTIAL/PLATELET - Abnormal; Notable for the following:    RBC 4.11 (*)    Hemoglobin 12.9 (*)    HCT 37.6 (*)    All other components within normal limits  CULTURE, BLOOD (ROUTINE X 2)  CULTURE, BLOOD (ROUTINE X 2)  LIPASE, BLOOD  LACTIC ACID, PLASMA  URINALYSIS, ROUTINE W REFLEX MICROSCOPIC  LACTIC ACID, PLASMA    EKG  EKG Interpretation  Date/Time:  Monday May 06 2017 17:56:02 EDT Ventricular Rate:  44 PR Interval:    QRS Duration: 97 QT Interval:  542 QTC Calculation: 464 R Axis:   -35 Text Interpretation:  Sinus bradycardia Atrial premature complex Left axis deviation Abnormal R-wave progression, early transition Confirmed by Fredia Sorrow [phone]) on 05/06/2017 6:00:16 PM       Radiology Dg Chest Port 1 View  Result Date: 05/06/2017 CLINICAL DATA:  Hypoxia EXAM: PORTABLE CHEST 1 VIEW COMPARISON:  April 27, 2017 FINDINGS: The heart size and mediastinal contours are stable. The aorta is tortuous. Both lungs  are clear. Degenerative joint changes of the shoulders are noted. IMPRESSION: No active cardiopulmonary disease. Electronically Signed   By: Abelardo Diesel M.D.   On: 05/06/2017 18:18    Procedures Procedures (including critical care time)  CRITICAL CARE Performed by: Fredia Sorrow Total critical care time: 30 minutes Critical care time was exclusive of separately billable procedures and treating other patients. Critical care was necessary to treat or prevent imminent or life-threatening deterioration. Critical care was time spent personally by me on the following activities: development of treatment plan with patient and/or surrogate as well as nursing, discussions with consultants, evaluation of patient's response to treatment, examination of patient, obtaining history from patient or surrogate, ordering and performing treatments and interventions, ordering and review of laboratory studies, ordering and review  of radiographic studies, pulse oximetry and re-evaluation of patient's condition.   Medications Ordered in ED Medications  0.9 %  sodium chloride infusion (0 mLs Intravenous Stopped 05/06/17 1850)  piperacillin-tazobactam (ZOSYN) IVPB 3.375 g (0 g Intravenous Stopped 05/06/17 1847)  vancomycin (VANCOCIN) IVPB 1000 mg/200 mL premix (0 mg Intravenous Stopped 05/06/17 1953)  sodium chloride 0.9 % bolus 500 mL (0 mLs Intravenous Stopped 05/06/17 1937)     Initial Impression / Assessment and Plan / ED Course  I have reviewed the triage vital signs and the nursing notes.  Pertinent labs & imaging results that were available during my care of the patient were reviewed by me and considered in my medical decision making (see chart for details).   due to the severe hypothermia patient was started on warming blanket and also sepsis protocol was initiated. Including broad-spectrum antibiotics. Patient's lactic acid not elevated. No significant leukocytosis. Never been hypotensive. So patient never  had full fluid challenge. Patient is alert CT without any significant abnormalities. Urinalysis still pending. Patient has been on the bear hugger. Temperature is increased from 222F 31.7C to 91.22F 32.9C  Patient is a DO NOT RESUSCITATE. Patient will require medical admission. Patient will recur prior to continue use of the bear hugger. Patient will need follow-up of his urine. Blood cultures have been sent and are pending. Patient's had a persistent sinus bradycardia. No hypotension. Oxygen saturations on 2 L have been in the upper 90s.   Family states no significant change in his mental status.  Addendum: Urine without evidence of infection. In light of not finding any of source for infection although it still possible as the cause of the hypothermia. The other possibility is a central lesion. Other possibility not mention to the hospitalists is hypothyroidism. TSH has been ordered and is pending. This was also mentioned to the family. Patient will be admitted to the stepdown unit.    Final Clinical Impressions(s) / ED Diagnoses   Final diagnoses:  Hypothermia, initial encounter    New Prescriptions New Prescriptions   No medications on file     Fredia Sorrow, MD 05/06/17 2102

## 2017-05-06 NOTE — Progress Notes (Addendum)
Pharmacy Note:  Initial antibiotics for Vancomycin and Zosyn ordered by EDP for Sepsis.  Estimated Creatinine Clearance: 65.1 mL/min (by C-G formula based on SCr of 0.68 mg/dL).   No Known Allergies  Vitals:   05/06/17 1735 05/06/17 1752  BP: (!) 157/85   Pulse: 65   Resp: 18   Temp:  (!) 89 F (31.7 C)    Anti-infectives    Start     Dose/Rate Route Frequency Ordered Stop   05/06/17 1800  piperacillin-tazobactam (ZOSYN) IVPB 3.375 g     3.375 g 100 mL/hr over 30 Minutes Intravenous  Once 05/06/17 1753     05/06/17 1800  vancomycin (VANCOCIN) IVPB 1000 mg/200 mL premix     1,000 mg 200 mL/hr over 60 Minutes Intravenous  Once 05/06/17 1753       Plan: Initial doses of Vancomycin and Zosyn X 1 ordered. F/U admission orders for further dosing if therapy continued.  Pricilla Larsson, [place] 05/06/2017 6:14 PM  Continued on admission. Vancomycin 750mg  IV every 12 hours. Zosyn 3.375gm IV every 8 hours. Follow-up micro data, labs, vitals. Pricilla Larsson, [facility] 05/06/2017 10:23 PM

## 2017-05-06 NOTE — H&P (Signed)
History and Physical    [name] CZY:[id number] DOB: [birth year] DOA: 05/06/2017  PCP: Dione Housekeeper, MD Consultants:  Krista Blue - Neurology Patient coming from: [facility] since last admission (6/23-28/18); NOK: brother, [phone]; son, [phone]  Chief Complaint: sent from SNF for concerns  HPI: [name] is a 83 y.o. male with medical history significant of remote prostate CA; dementia; glaucoma; and recent admission (6/23-28) for CVAs who was discharged to SNF.  [facility] called the family this afternoon, they were concerned that temperature dropped, BP was low, and he was throwing up blood.  Brother was there about 3pm and he was doing fine.  Facility called him about 430.  They did not specifiy how low BP was or how low temperature.  They also reported throwing up blood but did not quantify (Dr. Rogene Houston reports only 1 episode).  No emesis in the ER.   ED Course: Severe hypothermia - placed on a Bair hugger and given Vanc/Zosyn for possible sepsis.  CVA, infection, and hypothyroidism are current concerns as cause for symptoms.  Review of Systems: Unable to perform  Ambulatory Status:  Currently bedbound, requires significant assistance  Past Medical History:  Diagnosis Date  . CVA (cerebral vascular accident) ([place]) 04/2017  . Glaucoma   . Memory loss   . Prostate cancer ([place]) 2004  . Tubular adenoma polyp of rectum 10/08/2013    Past Surgical History:  Procedure Laterality Date  . FEMUR IM NAIL Left 07/05/2014   Procedure: INTRAMEDULLARY (IM) NAIL FEMORAL;  Surgeon: Marianna Payment, MD;  Location: [facility];  Service: Orthopedics;  Laterality: Left;  . prostate seed implant  2004    Social History   Social History  . Marital status: Single    Spouse name: N/A  . Number of children: 2  . Years of education: 14   Occupational History  . Retired    Social History Main Topics  . Smoking status: Never Smoker  . Smokeless tobacco: Never Used  .  Alcohol use No     Comment: 1 drink a month  . Drug use: No  . Sexual activity: Not on file   Other Topics Concern  . Not on file   Social History Narrative   Lives at home alone.   Right-handed.   No caffeine use.       No Known Allergies  Family History  Problem Relation Age of Onset  . Ovarian cancer Mother   . Heart disease Father   . Aneurysm Father     Prior to Admission medications   Medication Sig Start Date End Date Taking? Authorizing Provider  aspirin 325 MG tablet Take 1 tablet (325 mg total) by mouth daily. 05/03/17  Yes Debbe Odea, MD  Cholecalciferol (VITAMIN D) 2000 units CAPS Take 1 capsule by mouth daily.   Yes [provider]  donepezil (ARICEPT) 10 MG tablet Take 1 tablet (10 mg total) by mouth at bedtime. 08/22/16  Yes Dennie Bible, NP  furosemide (LASIX) 20 MG tablet Take 20 mg by mouth daily as needed for fluid.  01/21/17  Yes [provider]  latanoprost (XALATAN) 0.005 % ophthalmic solution Place 1 drop into both eyes at bedtime.   Yes [provider]  memantine (NAMENDA) 10 MG tablet Take 1 tablet (10 mg total) by mouth 2 (two) times daily. 08/22/16  Yes Dennie Bible, NP  cefpodoxime (VANTIN) 200 MG tablet Take 1 tablet (200 mg total) by mouth every 12 (twelve) hours. 05/02/17  Debbe Odea, MD    Physical Exam: Vitals:   05/06/17 2030 05/06/17 2100 05/06/17 2130 05/06/17 2200  BP: (!) 141/75 (!) 158/82 (!) 153/76 (!) 145/70  Pulse: (!) 51 (!) 41 (!) 47 (!) 51  Resp: 13 12 (!) 9 (!) 9  Temp:      TempSrc:      SpO2: 99% 99% 100% 100%  Weight:      Height:         General:  Appears calm and comfortable and is NAD; cool to the touch even under the Bair hugger Eyes:  PERRL, EOMI, normal lids, iris ENT:  grossly normal hearing, lips & tongue, mmm Neck:  no LAD, masses or thyromegaly Cardiovascular:  Bradycardia, no m/r/g. No LE edema.  Respiratory:  CTA bilaterally, no w/r/r. Normal respiratory  effort. Abdomen:  soft, ntnd, NABS Skin:  no rash or induration seen on limited exam Musculoskeletal:  grossly normal tone BUE/BLE, good ROM, no bony abnormality Psychiatric:  Alert, oriented to person (first name) but not place or time - family states this is his baseline Neurologic:  Unable to perform  Labs on Admission: I have personally reviewed following labs and imaging studies  CBC:  Recent Labs Lab 04/30/17 0508 05/06/17 1756  WBC 9.3 6.4  NEUTROABS  --  5.0  HGB 11.4* 12.9*  HCT 34.8* 37.6*  MCV 91.3 91.5  PLT 236 381   Basic Metabolic Panel:  Recent Labs Lab 05/06/17 1756  NA 137  K 3.7  CL 104  CO2 27  GLUCOSE 112*  BUN 19  CREATININE 0.75  CALCIUM 9.1   GFR: Estimated Creatinine Clearance: 65.1 mL/min (by C-G formula based on SCr of 0.75 mg/dL). Liver Function Tests:  Recent Labs Lab 05/06/17 1756  AST 44*  ALT 64*  ALKPHOS 82  BILITOT 0.6  PROT 6.6  ALBUMIN 3.3*    Recent Labs Lab 05/06/17 1756  LIPASE 25   No results for input(s): AMMONIA in the last 168 hours. Coagulation Profile: No results for input(s): INR, PROTIME in the last 168 hours. Cardiac Enzymes: No results for input(s): CKTOTAL, CKMB, CKMBINDEX, TROPONINI in the last 168 hours. BNP (last 3 results) No results for input(s): PROBNP in the last 8760 hours. HbA1C: No results for input(s): HGBA1C in the last 72 hours. CBG: No results for input(s): GLUCAP in the last 168 hours. Lipid Profile: No results for input(s): CHOL, HDL, LDLCALC, TRIG, CHOLHDL, LDLDIRECT in the last 72 hours. Thyroid Function Tests:  Recent Labs  05/06/17 1756  TSH 3.434   Anemia Panel: No results for input(s): VITAMINB12, FOLATE, FERRITIN, TIBC, IRON, RETICCTPCT in the last 72 hours. Urine analysis:    Component Value Date/Time   COLORURINE STRAW (A) 05/06/2017 1750   APPEARANCEUR CLEAR 05/06/2017 1750   LABSPEC 1.012 05/06/2017 1750   PHURINE 6.0 05/06/2017 1750   GLUCOSEU NEGATIVE  05/06/2017 1750   HGBUR NEGATIVE 05/06/2017 1750   BILIRUBINUR NEGATIVE 05/06/2017 1750   KETONESUR NEGATIVE 05/06/2017 1750   PROTEINUR NEGATIVE 05/06/2017 1750   UROBILINOGEN 1.0 12/22/2007 1456   NITRITE NEGATIVE 05/06/2017 1750   LEUKOCYTESUR NEGATIVE 05/06/2017 1750    Creatinine Clearance: Estimated Creatinine Clearance: 65.1 mL/min (by C-G formula based on SCr of 0.75 mg/dL).  Sepsis Labs: @LABRCNTIP (procalcitonin:4,lacticidven:4) ) Recent Results (from the past 240 hour(s))  Urine culture     Status: Abnormal   Collection Time: 04/27/17  7:51 PM  Result Value Ref Range Status   Specimen Description URINE, [place]  Final  Special Requests NONE  Final   Culture MULTIPLE SPECIES PRESENT, SUGGEST RECOLLECTION (A)  Final   Report Status 04/30/2017 FINAL  Final  Blood Culture (routine x 2)     Status: None (Preliminary result)   Collection Time: 05/06/17  5:56 PM  Result Value Ref Range Status   Specimen Description BLOOD RIGHT HAND  Final   Special Requests   Final    BOTTLES DRAWN AEROBIC AND ANAEROBIC Blood Culture adequate volume   Culture PENDING  Incomplete   Report Status PENDING  Incomplete  Blood Culture (routine x 2)     Status: None (Preliminary result)   Collection Time: 05/06/17  6:00 PM  Result Value Ref Range Status   Specimen Description BLOOD LEFT ARM  Final   Special Requests   Final    BOTTLES DRAWN AEROBIC AND ANAEROBIC Blood Culture adequate volume   Culture PENDING  Incomplete   Report Status PENDING  Incomplete     Radiological Exams on Admission: Ct Head Wo Contrast  Result Date: 05/06/2017 CLINICAL DATA:  Hypothermia.  Altered mental status. EXAM: CT HEAD WITHOUT CONTRAST TECHNIQUE: Contiguous axial images were obtained from the base of the skull through the vertex without intravenous contrast. COMPARISON:  Head CT 04/30/2017, 04/27/2017.  Brain MRI 04/28/2017 FINDINGS: Brain: No acute intracranial hemorrhage. Small acute infarcts on prior  MRI are not well seen by CT. Stable degree of atrophy and chronic small vessel ischemia. Remote lacunar infarcts in the right thalamus. No extra-axial fluid collection. Vascular: Atherosclerosis of skullbase vasculature without hyperdense vessel or abnormal calcification. Skull: No fracture or focal lesion. Sinuses/Orbits: Paranasal sinuses and mastoid air cells are clear. The visualized orbits are unremarkable. Bilateral cataract resection. Other: None. IMPRESSION: No acute abnormality. Stable degree of atrophy and chronic small vessel ischemia. Recent small infarcts on MRI are not well seen by CT. Electronically Signed   By: Jeb Levering M.D.   On: 05/06/2017 20:36   Dg Chest Port 1 View  Result Date: 05/06/2017 CLINICAL DATA:  Hypoxia EXAM: PORTABLE CHEST 1 VIEW COMPARISON:  April 27, 2017 FINDINGS: The heart size and mediastinal contours are stable. The aorta is tortuous. Both lungs are clear. Degenerative joint changes of the shoulders are noted. IMPRESSION: No active cardiopulmonary disease. Electronically Signed   By: Abelardo Diesel M.D.   On: 05/06/2017 18:18    EKG: Independently reviewed.  Sinus bradycardia with rate 44; no evidence of acute ischemia  Assessment/Plan Principal Problem:   Hypothermia Active Problems:   Bradycardia   Dementia   CVA (cerebral vascular accident) ([place])   Pertinent labs: Lactate negative x 2 Glucose 112 AST 44/ALT 64/Bili 0.6 (27/23/1.3 on 6/23) TSH 3.434 Normal UA  Hypothermia -Uncertain etiology -Environmental exposure is a common cause but since the patient resides in a SNF and was seen by family within 2 hours of the incident, this seems unlikely -Sepsis is a concern but he does not have other SIRS criteria and has a normal lactate.  Will cover with empiric abx for now while pending blood cultures. -Hypothyroidism is another known cause but his TSH is normal. -Given his multiple recent strokes, the most likely etiology may be a recurrent CVA  that impacted his hypothalamus.  Will request MRI for further evaluation.  CT was negative. -After discussion with the family, they prefer to leave the patient here at [facility] instead of transferring to [facility] for overnight MRI/neurology consult.  Instead, will await results of MRI tomorrow and determine whether to transfer patient at  that time. -Will continue with Bair hugger in the SDU overnight.  Bradycardia -Appears to be chronic, as it was present during last hospitalization -This may be related Aricept, as bradycardia is a known side effect associated with its use -No intervention for now  Dementia -Possibly worse after recent CVAs, although patient had apparently progressive disease prior -DNR status confirmed with family, he would desire aggressive measures -Based on findings during this hospitalization, it may be reasonable to pursue palliative care consultation    DVT prophylaxis:  Lovenox  Code Status: DNR - confirmed with family Family Communication: Son, brother present throughout evaluation Disposition Plan: Back to SNF once clinically improved Consults called: None  Admission status: It is my clinical opinion that referral for OBSERVATION is reasonable and necessary in this patient based on the above information provided. The aforementioned taken together are felt to place the patient at high risk for further clinical deterioration. However it is anticipated that the patient may be medically stable for discharge from the hospital within 24 to 48 hours.    Karmen Bongo MD [facility]  If 7PM-7AM, please contact night-coverage [email or web address] Password [email or web address]  05/06/2017, 10:19 PM

## 2017-05-06 NOTE — ED Triage Notes (Signed)
Pt comes in from [facility] for evaluation. Per facility, pt had one episode of coffee ground vomit today. He had dementia. Pt is alert.

## 2017-05-06 NOTE — ED Notes (Signed)
Pt heart rate high 30's/low 40's. MD made aware.

## 2017-05-06 NOTE — ED Notes (Signed)
Report to [place], RN ICU

## 2017-05-07 ENCOUNTER — Observation Stay (HOSPITAL_COMMUNITY): Payer: Medicare Other

## 2017-05-07 DIAGNOSIS — I680 Cerebral amyloid angiopathy: Secondary | ICD-10-CM | POA: Diagnosis present

## 2017-05-07 DIAGNOSIS — F0390 Unspecified dementia without behavioral disturbance: Secondary | ICD-10-CM | POA: Diagnosis not present

## 2017-05-07 DIAGNOSIS — R1310 Dysphagia, unspecified: Secondary | ICD-10-CM | POA: Diagnosis present

## 2017-05-07 DIAGNOSIS — Z8546 Personal history of malignant neoplasm of prostate: Secondary | ICD-10-CM | POA: Diagnosis not present

## 2017-05-07 DIAGNOSIS — F0391 Unspecified dementia with behavioral disturbance: Secondary | ICD-10-CM | POA: Diagnosis not present

## 2017-05-07 DIAGNOSIS — E854 Organ-limited amyloidosis: Secondary | ICD-10-CM | POA: Diagnosis present

## 2017-05-07 DIAGNOSIS — Z7189 Other specified counseling: Secondary | ICD-10-CM | POA: Diagnosis not present

## 2017-05-07 DIAGNOSIS — R1110 Vomiting, unspecified: Secondary | ICD-10-CM | POA: Diagnosis present

## 2017-05-07 DIAGNOSIS — T68XXXA Hypothermia, initial encounter: Secondary | ICD-10-CM | POA: Diagnosis present

## 2017-05-07 DIAGNOSIS — J690 Pneumonitis due to inhalation of food and vomit: Secondary | ICD-10-CM | POA: Diagnosis not present

## 2017-05-07 DIAGNOSIS — R001 Bradycardia, unspecified: Secondary | ICD-10-CM | POA: Diagnosis not present

## 2017-05-07 DIAGNOSIS — I498 Other specified cardiac arrhythmias: Secondary | ICD-10-CM | POA: Diagnosis not present

## 2017-05-07 DIAGNOSIS — I959 Hypotension, unspecified: Secondary | ICD-10-CM | POA: Diagnosis present

## 2017-05-07 DIAGNOSIS — I639 Cerebral infarction, unspecified: Secondary | ICD-10-CM | POA: Diagnosis present

## 2017-05-07 DIAGNOSIS — T68XXXD Hypothermia, subsequent encounter: Secondary | ICD-10-CM | POA: Diagnosis not present

## 2017-05-07 DIAGNOSIS — I6300 Cerebral infarction due to thrombosis of unspecified precerebral artery: Secondary | ICD-10-CM | POA: Diagnosis not present

## 2017-05-07 DIAGNOSIS — Z515 Encounter for palliative care: Secondary | ICD-10-CM | POA: Diagnosis not present

## 2017-05-07 DIAGNOSIS — Z79899 Other long term (current) drug therapy: Secondary | ICD-10-CM | POA: Diagnosis not present

## 2017-05-07 DIAGNOSIS — K920 Hematemesis: Secondary | ICD-10-CM | POA: Diagnosis present

## 2017-05-07 DIAGNOSIS — R7881 Bacteremia: Secondary | ICD-10-CM | POA: Diagnosis present

## 2017-05-07 DIAGNOSIS — G9341 Metabolic encephalopathy: Secondary | ICD-10-CM | POA: Diagnosis present

## 2017-05-07 DIAGNOSIS — Z66 Do not resuscitate: Secondary | ICD-10-CM | POA: Diagnosis present

## 2017-05-07 DIAGNOSIS — F0150 Vascular dementia without behavioral disturbance: Secondary | ICD-10-CM | POA: Diagnosis present

## 2017-05-07 DIAGNOSIS — I69398 Other sequelae of cerebral infarction: Secondary | ICD-10-CM | POA: Diagnosis not present

## 2017-05-07 DIAGNOSIS — Z7982 Long term (current) use of aspirin: Secondary | ICD-10-CM | POA: Diagnosis not present

## 2017-05-07 LAB — APTT: aPTT: 34 seconds (ref 24–36)

## 2017-05-07 LAB — CBC
HEMATOCRIT: 34.7 % — AB (ref 39.0–52.0)
Hemoglobin: 11.8 g/dL — ABNORMAL LOW (ref 13.0–17.0)
MCH: 31.1 pg (ref 26.0–34.0)
MCHC: 34 g/dL (ref 30.0–36.0)
MCV: 91.6 fL (ref 78.0–100.0)
PLATELETS: 243 10*3/uL (ref 150–400)
RBC: 3.79 MIL/uL — ABNORMAL LOW (ref 4.22–5.81)
RDW: 15.1 % (ref 11.5–15.5)
WBC: 5.3 10*3/uL (ref 4.0–10.5)

## 2017-05-07 LAB — BASIC METABOLIC PANEL
Anion gap: 7 (ref 5–15)
BUN: 14 mg/dL (ref 6–20)
CHLORIDE: 106 mmol/L (ref 101–111)
CO2: 26 mmol/L (ref 22–32)
CREATININE: 0.88 mg/dL (ref 0.61–1.24)
Calcium: 8.7 mg/dL — ABNORMAL LOW (ref 8.9–10.3)
GFR calc Af Amer: >60 mL/min (ref >60)
GFR calc non Af Amer: >60 mL/min (ref >60)
GLUCOSE: 89 mg/dL (ref 65–99)
POTASSIUM: 3.7 mmol/L (ref 3.5–5.1)
SODIUM: 139 mmol/L (ref 135–145)

## 2017-05-07 LAB — LACTIC ACID, PLASMA
Lactic Acid, Venous: 0.6 mmol/L (ref 0.5–1.9)
Lactic Acid, Venous: 0.7 mmol/L (ref 0.5–1.9)

## 2017-05-07 LAB — MRSA PCR SCREENING: MRSA by PCR: NEGATIVE

## 2017-05-07 MED ORDER — PANTOPRAZOLE SODIUM 40 MG PO TBEC
40.0000 mg | DELAYED_RELEASE_TABLET | Freq: Two times a day (BID) | ORAL | Status: DC
  Administered 2017-05-07 – 2017-05-14 (×13): 40 mg via ORAL
  Filled 2017-05-07 (×14): qty 1

## 2017-05-07 NOTE — Progress Notes (Addendum)
PT IS VERY QUIET,BUT AROUSABLE.. LESS AGITATED THAN THIS AM. FOLLOWING COMMANDS NOW. RECTAL TEMP 92.8. BARHUGGER WARMING BLANKET REAPPLIED. PT WAS FED AND ATE ALMOST 100% OF LUNCH AND DRANK 240CC TEA  W/O ANY DIFFICULTY.. DR SHORT NOTIFIED OF TEMP.Marland Kitchen HR IN THE 40'S AND 50'S. BP 110/75

## 2017-05-07 NOTE — Progress Notes (Addendum)
PROGRESS NOTE  Zenas Santa  HYI:[id number] DOB: 1932/10/13 DOA: 05/06/2017 PCP: Dione Housekeeper, MD  Brief Narrative:   The patient is an 83 year old male with history of remote prostate cancer, dementia, glaucoma, and recent admission 6/23-28 4 stroke who was discharged to a skilled nursing facility. The patient was transferred back to the hospital after he was found to be throwing up blood and developing hypothermia. In the emergency department, he had no further emesis. His initial temperature was 87F. He was placed on a bear hugger and started on vancomycin and Zosyn for possible sepsis. MRI demonstrates a possible subacute extension of a stroke seen during his previous hospitalization. His white blood cell count has been normal.  Even though his chest x-ray was unremarkable, he has a coarse cough suggestive of pneumonia or aspiration.  His urinalysis was unremarkable.  Blood cultures were obtained and are pending. TSH was within normal limits and cortisol level has not yet been drawn.    Assessment & Plan:   Principal Problem:   Hypothermia Active Problems:   Bradycardia   Dementia   CVA (cerebral vascular accident) ([place])  Hypothermia, likely due to underlying infection (suspect aspiration pneumonia) versus adrenal insufficiency -  Discontinue vancomycin as MRSA PCR is negative -  Continue Zosyn -  Follow-up blood cultures -  Check cortisol level in AM -  Continue Bear hugger as needed to keep temperature within normal limits -  MRI demonstrated a possible extension of one of the strokes that was seen during his previous hospitalization, but I doubt that this is the cause of his temperature instability -  SLP evaluation -  Repeat CXR in AM (after hydration)  Hematemesis -  None observed since admission -  Repeat CBC in AM -  BID PPI  Subacute strokes with possible extension, but no hemorrhagic conversation -  PT/OT/SLP reevaluations -  Continue daily aspirin -  LDL was 66,  A1c 5.4, ECHO with preserved EF of 60-65%, carotid duplex with 1-39% stenosis a week ago -  Per Neurology, due to amyloid angiopathy and dementia, patient is not a good candidate for anticoagulation and they did not pursue further work up during his previous hospitalization -  F/u with Dr. Leonie Man as outpatient  Bradycardia, chronic,  - may be side effect of Aricept - May be exacerbated by hypothermia  Dementia, worse after recent CVAs, although patient had apparently progressive disease prior -DNR status confirmed with family, he would desire aggressive measures -Based on findings during this hospitalization, it may be reasonable to pursue palliative care consultation  DVT prophylaxis:  Lovenox Code Status:  DO NOT RESUSCITATE Family Communication:  Brother Disposition Plan:  Pending temperature stability   Consultants:   None  Procedures:  MRI/MRA  Antimicrobials:  Anti-infectives    Start     Dose/Rate Route Frequency Ordered Stop   05/07/17 0600  vancomycin (VANCOCIN) IVPB 750 mg/150 ml premix  Status:  Discontinued     750 mg 150 mL/hr over 60 Minutes Intravenous Every 12 hours 05/06/17 2222 05/07/17 1135   05/07/17 0200  piperacillin-tazobactam (ZOSYN) IVPB 3.375 g     3.375 g 12.5 mL/hr over 240 Minutes Intravenous Every 8 hours 05/06/17 2222     05/06/17 1800  piperacillin-tazobactam (ZOSYN) IVPB 3.375 g     3.375 g 100 mL/hr over 30 Minutes Intravenous  Once 05/06/17 1753 05/06/17 1847   05/06/17 1800  vancomycin (VANCOCIN) IVPB 1000 mg/200 mL premix     1,000 mg 200 mL/hr over  60 Minutes Intravenous  Once 05/06/17 1753 05/06/17 1953       Subjective:  Patient states he feels totally normal. Denies lightheadedness, sinus congestion, chest pains, shortness of breath, nausea, difficulty urinating, dysuria, diarrhea  Objective: Vitals:   05/07/17 1201 05/07/17 1230 05/07/17 1300 05/07/17 1330  BP:      Pulse: (!) 49 (!) 50 (!) 50 (!) 52  Resp: 10 (!) 9 (!) 6  10  Temp: (!) 92.8 F (33.8 C)     TempSrc: Rectal     SpO2: 100% 98% 96% 95%  Weight:      Height:        Intake/Output Summary (Last 24 hours) at 05/07/17 1504 Last data filed at 05/07/17 1200  Gross per 24 hour  Intake             2860 ml  Output              300 ml  Net             2560 ml   Filed Weights   05/06/17 1739 05/06/17 2313 05/07/17 0500  Weight: 65.8 kg (145 lb) 65.1 kg (143 lb 8.3 oz) 65.1 kg (143 lb 8.3 oz)    Examination:  General exam:  Adult Male, slumped over to the right in the bed.  No acute distress.  HEENT:  NCAT, MMM Respiratory system: Rhonchorous cough, no focal rales, wheeze Cardiovascular system: Regular rate and rhythm, normal S1/S2. No murmurs, rubs, gallops or clicks.  Warm extremities Gastrointestinal system: Normal active bowel sounds, soft, nondistended, nontender. MSK:  Decreased tone and bulk, no lower extremity edema Neuro:  Grossly moves all extremities, but 4/5 strength throughout    Data Reviewed: I have personally reviewed following labs and imaging studies  CBC:  Recent Labs Lab 05/06/17 1756 05/07/17 0300  WBC 6.4 5.3  NEUTROABS 5.0  --   HGB 12.9* 11.8*  HCT 37.6* 34.7*  MCV 91.5 91.6  PLT 269 606   Basic Metabolic Panel:  Recent Labs Lab 05/06/17 1756 05/07/17 0300  NA 137 139  K 3.7 3.7  CL 104 106  CO2 27 26  GLUCOSE 112* 89  BUN 19 14  CREATININE 0.75 0.88  CALCIUM 9.1 8.7*   GFR: Estimated Creatinine Clearance: 58.6 mL/min (by C-G formula based on SCr of 0.88 mg/dL). Liver Function Tests:  Recent Labs Lab 05/06/17 1756  AST 44*  ALT 64*  ALKPHOS 82  BILITOT 0.6  PROT 6.6  ALBUMIN 3.3*    Recent Labs Lab 05/06/17 1756  LIPASE 25   No results for input(s): AMMONIA in the last 168 hours. Coagulation Profile:  Recent Labs Lab 05/06/17 2117  INR 0.91   Cardiac Enzymes: No results for input(s): CKTOTAL, CKMB, CKMBINDEX, TROPONINI in the last 168 hours. BNP (last 3 results) No  results for input(s): PROBNP in the last 8760 hours. HbA1C: No results for input(s): HGBA1C in the last 72 hours. CBG: No results for input(s): GLUCAP in the last 168 hours. Lipid Profile: No results for input(s): CHOL, HDL, LDLCALC, TRIG, CHOLHDL, LDLDIRECT in the last 72 hours. Thyroid Function Tests:  Recent Labs  05/06/17 1756  TSH 3.434   Anemia Panel: No results for input(s): VITAMINB12, FOLATE, FERRITIN, TIBC, IRON, RETICCTPCT in the last 72 hours. Urine analysis:    Component Value Date/Time   COLORURINE STRAW (A) 05/06/2017 1750   APPEARANCEUR CLEAR 05/06/2017 1750   LABSPEC 1.012 05/06/2017 1750   PHURINE 6.0 05/06/2017 1750  GLUCOSEU NEGATIVE 05/06/2017 1750   HGBUR NEGATIVE 05/06/2017 1750   BILIRUBINUR NEGATIVE 05/06/2017 1750   KETONESUR NEGATIVE 05/06/2017 1750   PROTEINUR NEGATIVE 05/06/2017 1750   UROBILINOGEN 1.0 12/22/2007 1456   NITRITE NEGATIVE 05/06/2017 1750   LEUKOCYTESUR NEGATIVE 05/06/2017 1750   Sepsis Labs: @LABRCNTIP (procalcitonin:4,lacticidven:4)  ) Recent Results (from the past 240 hour(s))  Urine culture     Status: Abnormal   Collection Time: 04/27/17  7:51 PM  Result Value Ref Range Status   Specimen Description URINE, CLEAN CATCH  Final   Special Requests NONE  Final   Culture MULTIPLE SPECIES PRESENT, SUGGEST RECOLLECTION (A)  Final   Report Status 04/30/2017 FINAL  Final  Blood Culture (routine x 2)     Status: None (Preliminary result)   Collection Time: 05/06/17  5:56 PM  Result Value Ref Range Status   Specimen Description BLOOD RIGHT HAND  Final   Special Requests   Final    BOTTLES DRAWN AEROBIC AND ANAEROBIC Blood Culture adequate volume   Culture NO GROWTH < 24 HOURS  Final   Report Status PENDING  Incomplete  Blood Culture (routine x 2)     Status: None (Preliminary result)   Collection Time: 05/06/17  6:00 PM  Result Value Ref Range Status   Specimen Description BLOOD LEFT ARM  Final   Special Requests   Final     BOTTLES DRAWN AEROBIC AND ANAEROBIC Blood Culture adequate volume   Culture NO GROWTH < 24 HOURS  Final   Report Status PENDING  Incomplete  MRSA PCR Screening     Status: None   Collection Time: 05/06/17 11:10 PM  Result Value Ref Range Status   MRSA by PCR NEGATIVE NEGATIVE Final    Comment:        The GeneXpert MRSA Assay (FDA approved for NASAL specimens only), is one component of a comprehensive MRSA colonization surveillance program. It is not intended to diagnose MRSA infection nor to guide or monitor treatment for MRSA infections.       Radiology Studies: Ct Head Wo Contrast  Result Date: 05/06/2017 CLINICAL DATA:  Hypothermia.  Altered mental status. EXAM: CT HEAD WITHOUT CONTRAST TECHNIQUE: Contiguous axial images were obtained from the base of the skull through the vertex without intravenous contrast. COMPARISON:  Head CT 04/30/2017, 04/27/2017.  Brain MRI 04/28/2017 FINDINGS: Brain: No acute intracranial hemorrhage. Small acute infarcts on prior MRI are not well seen by CT. Stable degree of atrophy and chronic small vessel ischemia. Remote lacunar infarcts in the right thalamus. No extra-axial fluid collection. Vascular: Atherosclerosis of skullbase vasculature without hyperdense vessel or abnormal calcification. Skull: No fracture or focal lesion. Sinuses/Orbits: Paranasal sinuses and mastoid air cells are clear. The visualized orbits are unremarkable. Bilateral cataract resection. Other: None. IMPRESSION: No acute abnormality. Stable degree of atrophy and chronic small vessel ischemia. Recent small infarcts on MRI are not well seen by CT. Electronically Signed   By: Jeb Levering M.D.   On: 05/06/2017 20:36   Mr Jodene Nam Head Wo Contrast  Result Date: 05/07/2017 CLINICAL DATA:  Hypothermia. Altered mental status. History of dementia. EXAM: MRI HEAD WITHOUT CONTRAST MRA HEAD WITHOUT CONTRAST TECHNIQUE: Multiplanar, multiecho pulse sequences of the brain and surrounding structures  were obtained without intravenous contrast. Angiographic images of the head were obtained using MRA technique without contrast. COMPARISON:  04/28/2017 FINDINGS: MRI HEAD FINDINGS Brain: Subacute right lateral medullary infarct appears more extensive than on prior. A subacute left cerebellar infarct on the previous study  has decreased in restriction intensity. Cerebral cortical infarcts seen previously are no longer visualized. There are numerous small remote bilateral cerebellar infarcts. Remote lacunar infarct in the right thalamus. Atrophy with ventriculomegaly, especially of the temporal and occipital lobes. There is chronic confluent FLAIR hyperintensity in the cerebral white matter with remote lacunes in the bilateral centrum semiovale. The corpus callosum is thinned the setting of cerebral volume loss. Numerous foci of chronic lobar microhemorrhage. Vascular: Arterial findings below. Normal dural venous sinus flow voids. Skull and upper cervical spine: Negative for marrow lesion. Sinuses/Orbits: No acute finding.  Bilateral cataract resection. MRA HEAD FINDINGS Solitary visible left vertebral artery, stable from CTA 04/28/2017. The right vertebral artery is hypoplastic and may be occluded at the V4 segment. Mild proximal basilar atheromatous type narrowing. The right P1 segment is hypoplastic. Right P2 segment is attenuated and appears diffusely irregular, apparently new from prior but potentially artifactual on this motion degraded study. No reversible flow limiting stenosis in the anterior circulation. There is moderate irregularity of bilateral MCA branches, again potentially related to artifact rather than atherosclerosis. IMPRESSION: 1. Subacute right lateral medullary infarct with questionable increased extent compared to 04/28/2017 MRI. Subacute infarcts in the left cerebellum and bilateral cerebral cortex are fading. No new site of ischemia; no hemorrhagic conversion. 2. Motion degraded MRA which may  account for apparent newly narrowed bilateral M2 and right P2 segments when compared to CTA 04/30/2017. 3. Advanced atrophy and chronic white matter disease. Remote lobar micro hemorrhages may reflect underlying amyloid angiography. Electronically Signed   By: Monte Fantasia M.D.   On: 05/07/2017 10:31   Mr Brain Wo Contrast  Result Date: 05/07/2017 CLINICAL DATA:  Hypothermia. Altered mental status. History of dementia. EXAM: MRI HEAD WITHOUT CONTRAST MRA HEAD WITHOUT CONTRAST TECHNIQUE: Multiplanar, multiecho pulse sequences of the brain and surrounding structures were obtained without intravenous contrast. Angiographic images of the head were obtained using MRA technique without contrast. COMPARISON:  04/28/2017 FINDINGS: MRI HEAD FINDINGS Brain: Subacute right lateral medullary infarct appears more extensive than on prior. A subacute left cerebellar infarct on the previous study has decreased in restriction intensity. Cerebral cortical infarcts seen previously are no longer visualized. There are numerous small remote bilateral cerebellar infarcts. Remote lacunar infarct in the right thalamus. Atrophy with ventriculomegaly, especially of the temporal and occipital lobes. There is chronic confluent FLAIR hyperintensity in the cerebral white matter with remote lacunes in the bilateral centrum semiovale. The corpus callosum is thinned the setting of cerebral volume loss. Numerous foci of chronic lobar microhemorrhage. Vascular: Arterial findings below. Normal dural venous sinus flow voids. Skull and upper cervical spine: Negative for marrow lesion. Sinuses/Orbits: No acute finding.  Bilateral cataract resection. MRA HEAD FINDINGS Solitary visible left vertebral artery, stable from CTA 04/28/2017. The right vertebral artery is hypoplastic and may be occluded at the V4 segment. Mild proximal basilar atheromatous type narrowing. The right P1 segment is hypoplastic. Right P2 segment is attenuated and appears  diffusely irregular, apparently new from prior but potentially artifactual on this motion degraded study. No reversible flow limiting stenosis in the anterior circulation. There is moderate irregularity of bilateral MCA branches, again potentially related to artifact rather than atherosclerosis. IMPRESSION: 1. Subacute right lateral medullary infarct with questionable increased extent compared to 04/28/2017 MRI. Subacute infarcts in the left cerebellum and bilateral cerebral cortex are fading. No new site of ischemia; no hemorrhagic conversion. 2. Motion degraded MRA which may account for apparent newly narrowed bilateral M2 and right P2 segments when  compared to CTA 04/30/2017. 3. Advanced atrophy and chronic white matter disease. Remote lobar micro hemorrhages may reflect underlying amyloid angiography. Electronically Signed   By: Monte Fantasia M.D.   On: 05/07/2017 10:31   Dg Chest Port 1 View  Result Date: 05/06/2017 CLINICAL DATA:  Hypoxia EXAM: PORTABLE CHEST 1 VIEW COMPARISON:  April 27, 2017 FINDINGS: The heart size and mediastinal contours are stable. The aorta is tortuous. Both lungs are clear. Degenerative joint changes of the shoulders are noted. IMPRESSION: No active cardiopulmonary disease. Electronically Signed   By: Abelardo Diesel M.D.   On: 05/06/2017 18:18     Scheduled Meds: . aspirin  325 mg Oral Daily  . docusate sodium  100 mg Oral BID  . donepezil  10 mg Oral QHS  . enoxaparin (LOVENOX) injection  40 mg Subcutaneous Q24H  . latanoprost  1 drop Both Eyes QHS  . memantine  10 mg Oral BID  . pantoprazole  40 mg Oral BID AC   Continuous Infusions: . sodium chloride Stopped (05/06/17 1850)  . piperacillin-tazobactam (ZOSYN)  IV Stopped (05/07/17 1315)     LOS: 0 days    Time spent: 30 min    Janece Canterbury, MD [facility] Pager [phone]  If 7PM-7AM, please contact night-coverage [email or web address] Password [email or web address] 05/07/2017, 3:04 PM

## 2017-05-08 ENCOUNTER — Inpatient Hospital Stay (HOSPITAL_COMMUNITY): Payer: Medicare Other

## 2017-05-08 DIAGNOSIS — F0391 Unspecified dementia with behavioral disturbance: Secondary | ICD-10-CM

## 2017-05-08 DIAGNOSIS — T68XXXD Hypothermia, subsequent encounter: Secondary | ICD-10-CM

## 2017-05-08 DIAGNOSIS — I498 Other specified cardiac arrhythmias: Secondary | ICD-10-CM

## 2017-05-08 DIAGNOSIS — J690 Pneumonitis due to inhalation of food and vomit: Principal | ICD-10-CM

## 2017-05-08 DIAGNOSIS — R001 Bradycardia, unspecified: Secondary | ICD-10-CM

## 2017-05-08 LAB — MAGNESIUM: Magnesium: 1.8 mg/dL (ref 1.7–2.4)

## 2017-05-08 LAB — BASIC METABOLIC PANEL
ANION GAP: 9 (ref 5–15)
BUN: 13 mg/dL (ref 6–20)
CO2: 27 mmol/L (ref 22–32)
Calcium: 8.9 mg/dL (ref 8.9–10.3)
Chloride: 102 mmol/L (ref 101–111)
Creatinine, Ser: 1.08 mg/dL (ref 0.61–1.24)
GFR calc Af Amer: >60 mL/min (ref >60)
GFR calc non Af Amer: >60 mL/min (ref >60)
GLUCOSE: 93 mg/dL (ref 65–99)
POTASSIUM: 3.9 mmol/L (ref 3.5–5.1)
Sodium: 138 mmol/L (ref 135–145)

## 2017-05-08 LAB — CORTISOL: Cortisol, Plasma: 12.4 ug/dL

## 2017-05-08 MED ORDER — ORAL CARE MOUTH RINSE
15.0000 mL | Freq: Two times a day (BID) | OROMUCOSAL | Status: DC
Start: 2017-05-09 — End: 2017-05-14
  Administered 2017-05-09 – 2017-05-14 (×9): 15 mL via OROMUCOSAL

## 2017-05-08 MED ORDER — CHLORHEXIDINE GLUCONATE 0.12 % MT SOLN
15.0000 mL | Freq: Two times a day (BID) | OROMUCOSAL | Status: DC
  Administered 2017-05-08 – 2017-05-14 (×12): 15 mL via OROMUCOSAL
  Filled 2017-05-08 (×12): qty 15

## 2017-05-08 MED ORDER — COSYNTROPIN 0.25 MG IJ SOLR
0.2500 mg | Freq: Once | INTRAMUSCULAR | Status: AC
  Administered 2017-05-09: 0.25 mg via INTRAVENOUS
  Filled 2017-05-08 (×2): qty 0.25

## 2017-05-08 MED ORDER — HALOPERIDOL LACTATE 5 MG/ML IJ SOLN
5.0000 mg | Freq: Four times a day (QID) | INTRAMUSCULAR | Status: DC | PRN
  Administered 2017-05-08 – 2017-05-09 (×3): 5 mg via INTRAVENOUS
  Filled 2017-05-08 (×3): qty 1

## 2017-05-08 NOTE — Evaluation (Signed)
Clinical/Bedside Swallow Evaluation Patient Details  Name: [name] MRN: [id number] Date of Birth: January 30, 1932  Today's Date: 05/08/2017 Time: SLP Start Time (ACUTE ONLY): 0930 SLP Stop Time (ACUTE ONLY): 1000 SLP Time Calculation (min) (ACUTE ONLY): 30 min  Past Medical History:  Past Medical History:  Diagnosis Date  . CVA (cerebral vascular accident) ([place]) 04/2017  . Glaucoma   . Memory loss   . Prostate cancer ([place]) 2004  . Tubular adenoma polyp of rectum 10/08/2013   Past Surgical History:  Past Surgical History:  Procedure Laterality Date  . FEMUR IM NAIL Left 07/05/2014   Procedure: INTRAMEDULLARY (IM) NAIL FEMORAL;  Surgeon: Marianna Payment, MD;  Location: [facility];  Service: Orthopedics;  Laterality: Left;  . prostate seed implant  [id number]   HPI:  83 year old male with history of remote prostate cancer, dementia, glaucoma, and recent admission 6/23-28 4 stroke who was discharged to a skilled nursing facility. The patient was transferred back to the hospital after he was found to be throwing up blood and developing hypothermia. In the emergency department, he had no further emesis. His initial temperature was 61F. He was placed on a bear hugger and started on vancomycin and Zosyn for possible sepsis. MRI demonstrates a possible subacute extension of a stroke seen during his previous hospitalization. His white blood cell count has been normal. Even though his chest x-ray was unremarkable, he has a coarse cough suggestive of pneumonia or aspiration. His urinalysis was unremarkable. Blood cultures were obtained and are pending. TSH was within normal limits and cortisol level has not yet been drawn. MRI shows: Subacute right lateral medullary infarct with questionable increased extent compared to 04/28/2017 MRI. Subacute infarcts in the left cerebellum and bilateral cerebral cortex are fading. No new site of ischemia; no hemorrhagic conversion. BSE ordered due to suspicion of aspiration  PNA. Pt passed RN swallow screen during both admissions (last week and yesterday). Pt reportedly tolerating diet well when alert.   Assessment / Plan / Recommendation Clinical Impression  Clinical swallow evaluation completed at bedside. Pt was recently given Haldol due to agitation per RN, however Pt agreeable to participate in swallowing evaluation. Pt cooperative, but drowsy. Pt with mild right facial weakness and SLP observed right labial spillage of oral secretions/saliva. Pt reportedly passed RN swallow screen during admission to [facility] last week and yesterday at [facility]. 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 Pt reportedly had one episode of vomiting upon admission, but none witnessed since. Vocal quality is not hoarse, but with reduced vocal intensity and mild wet vocal quality (pt throat clearing). Pt assessed with thin, nectars, puree, and puree with meds whole). Pt with delayed and immediate throat clearing and coughing after thin liquids. Improved performance noted with nectars. Given location of stroke, recommend strict monitoring of diet tolerance and MBSS to be completed tomorrow. Will downgrade diet to D1/puree with NTL for now. OK for po meds whole in puree when alert and cooperative. Discontinue PO today if Pt showing s/sx aspiration. Chest x-ray from yesterday was clear, WBC normal, and Pt afebrile. Chest x-ray pending for today (unable to complete due to agitation). SLP will follow. Above to RN.  SLP Visit Diagnosis: Dysphagia, oropharyngeal phase (R13.12)    Aspiration Risk  Mild aspiration risk    Diet Recommendation Dysphagia 1 (Puree);Nectar-thick liquid   Liquid Administration via: Cup Medication Administration: Whole meds with puree Supervision: Staff to assist with self feeding;Full  supervision/cueing for compensatory strategies Compensations: Slow rate;Small sips/bites;Clear  throat intermittently Postural Changes: Seated upright at 90 degrees;Remain upright for at least 30 minutes after po intake    Other  Recommendations Oral Care Recommendations: Staff/trained caregiver to provide oral care;Oral care BID Other Recommendations: Order thickener from pharmacy;Clarify dietary restrictions   Follow up Recommendations Skilled Nursing facility      Frequency and Duration min 2x/week  1 week       Prognosis Prognosis for Safe Diet Advancement: Fair Barriers to Reach Goals: Cognitive deficits      Swallow Study   General Date of Onset: 05/06/17 HPI: 83 year old male with history of remote prostate cancer, dementia, glaucoma, and recent admission 6/23-28 4 stroke who was discharged to a skilled nursing facility. The patient was transferred back to the hospital after he was found to be throwing up blood and developing hypothermia. In the emergency department, he had no further emesis. His initial temperature was 90F. He was placed on a bear hugger and started on vancomycin and Zosyn for possible sepsis. MRI demonstrates a possible subacute extension of a stroke seen during his previous hospitalization. His white blood cell count has been normal. Even though his chest x-ray was unremarkable, he has a coarse cough suggestive of pneumonia or aspiration. His urinalysis was unremarkable. Blood cultures were obtained and are pending. TSH was within normal limits and cortisol level has not yet been drawn. MRI shows: Subacute right lateral medullary infarct with questionable increased extent compared to 04/28/2017 MRI. Subacute infarcts in the left cerebellum and bilateral cerebral cortex are fading. No new site of ischemia; no hemorrhagic conversion. BSE ordered due to suspicion of aspiration PNA. Pt passed RN swallow screen during both admissions (last week and yesterday). Pt reportedly tolerating diet well when alert. Type of Study: Bedside Swallow Evaluation Previous  Swallow Assessment: No formal BSE; RN swallow screen only Diet Prior to this Study: Dysphagia 3 (soft);Thin liquids Temperature Spikes Noted: No Respiratory Status: Room air History of Recent Intubation: No Behavior/Cognition: Cooperative;Pleasant mood;Lethargic/Drowsy (recently given Haldol due to agitation) Oral Cavity Assessment: Within Functional Limits;Excessive secretions Oral Care Completed by SLP: Yes Oral Cavity - Dentition: Poor condition Vision: Impaired for self-feeding Self-Feeding Abilities: Needs assist Patient Positioning: Upright in bed Baseline Vocal Quality: Low vocal intensity Volitional Cough: Strong Volitional Swallow: Able to elicit    Oral/Motor/Sensory Function Overall Oral Motor/Sensory Function: Mild impairment Facial ROM: Reduced right   Ice Chips Ice chips: Within functional limits Presentation: Spoon   Thin Liquid Thin Liquid: Impaired Presentation: Cup;Straw Pharyngeal  Phase Impairments: Suspected delayed Swallow;Decreased hyoid-laryngeal movement;Throat Clearing - Immediate;Cough - Immediate    Nectar Thick Nectar Thick Liquid: Impaired Presentation: Cup Pharyngeal Phase Impairments: Suspected delayed Swallow   Honey Thick Honey Thick Liquid: Not tested   Puree Puree: Within functional limits Presentation: Spoon   Solid   Thank you,  Genene Churn, [place]    Solid: Not tested        Dahna Hattabaugh 05/08/2017,10:12 AM

## 2017-05-08 NOTE — Progress Notes (Signed)
OT Cancellation Note  Patient Details Name: [name] MRN: [id number] DOB: 05/15/1932   Cancelled Treatment:    Reason Eval/Treat Not Completed: OT screened, no needs identified, will sign off. Chart reviewed, pt is from [facility] where he discharged to on 6/28 from [facility]. As of 6/28 pt requiring max-total assist for all mobility tasks and ADL completion; prior to this he had an aide 8 hours per day for ADL assistance and supervision due to dementia. At this time no acute OT services are necessary, recommend resumption of rehab services on discharge back to SNF.   Guadelupe Sabin, OTR/L  [phone] 05/08/2017, 8:07 AM

## 2017-05-08 NOTE — Progress Notes (Signed)
Patient very confused and agitated. Attempted to obtain temperature and patient began to yell "get your damn hands off of me" and would swing arms in an attempt to hit nurses and tech assisting. Unable to get temperature at this time.

## 2017-05-08 NOTE — Progress Notes (Signed)
Patient has been having frequent runs of V tach. Made MD, Darrick Meigs, aware via text page. Orders for lab to check magnesium levels were placed. Will continue to monitor.

## 2017-05-08 NOTE — Evaluation (Signed)
Physical Therapy Evaluation Patient Details Name: [name] MRN: [id number] DOB: [birth year] Today's Date: 05/08/2017   History of Present Illness  [name] is a 83 y.o. male with medical history significant of remote prostate CA; dementia; glaucoma; and recent admission (6/23-28) for CVAs who was discharged to SNF.  [facility] called the family this afternoon, they were concerned that temperature dropped, BP was low, and he was throwing up blood.  Brother was there about 3pm and he was doing fine.  Facility called him about 430.  They did not specifiy how low BP was or how low temperature.  They also reported throwing up blood but did not quantify (Dr. Rogene Houston reports only 1 episode).  No emesis in the ER.  Clinical Impression  PT agreeable to therapy and able to follow all one step commands.  PT fatigues quickly.  Pt was able to sit unsupported but therapist was unable to attempt standing due to pt needing to be transferred back to supine for a procedure.  Pt will need SNF at discharge to maximize his functional ability.     Follow Up Recommendations SNF    Equipment Recommendations  None recommended by PT    Recommendations for Other Services OT consult     Precautions / Restrictions Precautions Precautions: Fall Restrictions Weight Bearing Restrictions: No      Mobility  Bed Mobility Overal bed mobility: Needs Assistance Bed Mobility: Rolling;Sidelying to Sit;Sit to Supine Rolling: Min assist Sidelying to sit: Max assist   Sit to supine: Total assist      Transfers                 General transfer comment: Pt sat on EOB x-ray came and stated that they needed to complete the xray.  Pt returned to bed   Ambulation/Gait                Stairs            Wheelchair Mobility    Modified Rankin (Stroke Patients Only)       Balance Overall balance assessment: Needs assistance Sitting-balance support: Feet supported;Bilateral upper  extremity supported Sitting balance-Leahy Scale: Fair   Postural control: Right lateral lean                                   Pertinent Vitals/Pain Pain Assessment: No/denies pain    Home Living Family/patient expects to be discharged to:: Skilled nursing facility Living Arrangements: Alone Available Help at Discharge: Family;Available 24 hours/day;Personal care attendant (6 days/week for 8 hours/day) Type of Home: House Home Access: Stairs to enter Entrance Stairs-Rails: Psychiatric nurse of Steps: 3 Home Layout: One level Home Equipment: Cane - single point      Prior Function Level of Independence: Needs assistance   Gait / Transfers Assistance Needed: pt ambulates with use of SPC  ADL's / Homemaking Assistance Needed: requires assistance for bathing, dressing, meal preparation        Hand Dominance   Dominant Hand: Right    Extremity/Trunk Assessment   Upper Extremity Assessment Upper Extremity Assessment: Generalized weakness    Lower Extremity Assessment Lower Extremity Assessment: Generalized weakness       Communication   Communication: No difficulties  Cognition Arousal/Alertness: Lethargic Behavior During Therapy: Flat affect Overall Cognitive Status: Impaired/Different from baseline Area of Impairment: Attention;Orientation;Memory;Following commands;Safety/judgement;Awareness;Problem solving  Orientation Level: Disoriented to;Time;Situation Current Attention Level: Sustained Memory: Decreased short-term memory Following Commands: Follows one step commands with increased time;Follows one step commands consistently Safety/Judgement: Decreased awareness of deficits;Decreased awareness of safety Awareness: Intellectual Problem Solving: Slow processing;Decreased initiation;Difficulty sequencing;Requires verbal cues;Requires tactile cues        General Comments      Exercises General Exercises  - Lower Extremity Short Arc Quad: Both;5 reps Heel Slides: Both;5 reps Hip ABduction/ADduction: Both;5 reps   Assessment/Plan    PT Assessment Patient needs continued PT services  PT Problem List Decreased strength;Decreased activity tolerance;Decreased balance;Decreased mobility       PT Treatment Interventions Gait training;Functional mobility training;Therapeutic activities;Therapeutic exercise;Balance training;Neuromuscular re-education;Patient/family education    PT Goals (Current goals can be found in the Care Plan section)  Acute Rehab PT Goals Patient Stated Goal: none stated    Frequency Min 5X/week   Barriers to discharge        Co-evaluation     PT goals addressed during session: Mobility/safety with mobility;Balance;Strengthening/ROM         AM-PAC PT "6 Clicks" Daily Activity  Outcome Measure Difficulty turning over in bed (including adjusting bedclothes, sheets and blankets)?: A Lot Difficulty moving from lying on back to sitting on the side of the bed? : Total Difficulty sitting down on and standing up from a chair with arms (e.g., wheelchair, bedside commode, etc,.)?: Total Help needed moving to and from a bed to chair (including a wheelchair)?: Total Help needed walking in hospital room?: Total Help needed climbing 3-5 steps with a railing? : Total 6 Click Score: 7    End of Session   Activity Tolerance: Patient tolerated treatment well Patient left: in bed (x-ray in room to complete procedure)   PT Visit Diagnosis: Muscle weakness (generalized) (M62.81);Difficulty in walking, not elsewhere classified (R26.2);Other abnormalities of gait and mobility (R26.89)    Time: 1030-1102 PT Time Calculation (min) (ACUTE ONLY): 32 min   Charges:   PT Evaluation $PT Eval Moderate Complexity: 1 Procedure PT Treatments $Therapeutic Exercise: 8-22 mins   PT G CodesRayetta Humphrey, PT CLT [phone] 05/08/2017, 11:05 AM

## 2017-05-08 NOTE — Progress Notes (Addendum)
PROGRESS NOTE  [name] NAT:[id number] DOB: November 06, 1931 DOA: 05/06/2017 PCP: Dione Housekeeper, MD  Brief History:  83 year old male with history of remote prostate cancer, dementia, glaucoma, and recent admission 6/23-28 4 stroke who was discharged to a skilled nursing facility. The patient was transferred back to the hospital after he was found to be throwing up blood and developing hypothermia. In the emergency department, he had no further emesis. His initial temperature was 38F. He was placed on a bear hugger and started on vancomycin and Zosyn for possible sepsis. MRI demonstrates a possible subacute extension of a stroke seen during his previous hospitalization. His white blood cell count has been normal.  Even though his chest x-ray was unremarkable, he has a coarse cough suggestive of pneumonia or aspiration.  His urinalysis was unremarkable.  Blood cultures were obtained and are pending. TSH was within normal limits and cortisol level has not yet been drawn.    Assessment/Plan: Hypothermia, likely due to underlying infection (suspect aspiration pneumonia) versus adrenal insufficiency -  Discontinue vancomycin as MRSA PCR is negative -  Continue Zosyn -  Follow-up blood cultures -  Followup cortisol level  -  Continue Bear hugger as needed to keep temperature within normal limits -  MRI demonstrated a possible extension of one of the strokes that was seen during his previous hospitalization, but I doubt that this is the cause of his temperature instability -  SLP evaluation -  Repeat CXR(after hydration) -I question accuracy of the patient's temperatures as the patient has been agitated and there has been difficulty obtaining accurate temperatures orally or axillary -check rectal temp  Hematemesis -  None observed since admission--hemoglobin remained stable -  Repeat CBC in AM -  BID PPI  Subacute strokes  -05/07/2017 MRI brain subacute right lateral medullary infarct  with increased extension, no new stroke. -Extension of stroke likely due to relative hypotension -  PT/OT/SLP reevaluations -  Continue daily aspirin -  LDL was 66, A1c 5.4, ECHO with preserved EF of 60-65%, carotid duplex with 1-39% stenosis a week ago -  Per Neurology, due to amyloid angiopathy and dementia, patient is not a good candidate for anticoagulation and they did not pursue further work up during his previous hospitalization -Cased was discussed with neurology, Dr. Sherin Quarry need for transfer or further workup -  F/u with Dr. Leonie Man as outpatient  Bradycardia, chronic,  - may be side effect of Aricept - May be exacerbated by hypothermia -No AV blocks or pauses on telemetry--personally reviewed tele  Dementia, worse after recent CVAs, although patient had apparently progressive disease prior -DNR status confirmed with family, he would desire aggressive measures -Based on findings during this hospitalization, it may be reasonable to pursue palliative care consultation -haldol prn agitation    Disposition Plan:  SNF 1-2 days pending stability  Family Communication:   No Family at bedside  Consultants:  none  Code Status:  DNR, palliative medicine  DVT Prophylaxis:    Lovenox   Procedures: As Listed in Progress Note Above  Antibiotics: Zosyn 7/2>>> vanco 7/2>>>7/3    Subjective: Patient is presently confused. He gets agitated easily with any type of manipulation. Review of systems is limited secondary to patient's confusion, but denies any chest pain, short of breath, abdominal pain. No reports of vomiting, diarrhea, respiratory distress per nursing staff.  Objective: Vitals:   05/08/17 0600 05/08/17 0630 05/08/17 0700 05/08/17 0800  BP: (!) 158/80 (!) 163/81 Marland Kitchen)  161/92 (!) 156/132  Pulse: 67     Resp: 11 16 10 18   Temp:   (!) 94.2 F (34.6 C)   TempSrc:      SpO2: 99%     Weight: 63.1 kg (139 lb 1.8 oz)     Height:        Intake/Output Summary  (Last 24 hours) at 05/08/17 0914 Last data filed at 05/08/17 0500  Gross per 24 hour  Intake              750 ml  Output              300 ml  Net              450 ml   Weight change: -2.672 kg (-5 lb 14.2 oz) Exam:   General:  Pt is alert, follows commands appropriately, not in acute distress  HEENT: No icterus, No thrush, No neck mass, [place]/AT  Cardiovascular: RRR, S1/S2, no rubs, no gallops  Respiratory: Bibasilar rales. No wheeze.  Abdomen: Soft/+BS, non tender, non distended, no guarding  Extremities: No edema, No lymphangitis, No petechiae, No rashes, no synovitis   Data Reviewed: I have personally reviewed following labs and imaging studies Basic Metabolic Panel:  Recent Labs Lab 05/06/17 1756 05/07/17 0300 05/08/17 0555  NA 137 139 138  K 3.7 3.7 3.9  CL 104 106 102  CO2 27 26 27   GLUCOSE 112* 89 93  BUN 19 14 13   CREATININE 0.75 0.88 1.08  CALCIUM 9.1 8.7* 8.9  MG  --   --  1.8   Liver Function Tests:  Recent Labs Lab 05/06/17 1756  AST 44*  ALT 64*  ALKPHOS 82  BILITOT 0.6  PROT 6.6  ALBUMIN 3.3*    Recent Labs Lab 05/06/17 1756  LIPASE 25   No results for input(s): AMMONIA in the last 168 hours. Coagulation Profile:  Recent Labs Lab 05/06/17 2117  INR 0.91   CBC:  Recent Labs Lab 05/06/17 1756 05/07/17 0300  WBC 6.4 5.3  NEUTROABS 5.0  --   HGB 12.9* 11.8*  HCT 37.6* 34.7*  MCV 91.5 91.6  PLT 269 243   Cardiac Enzymes: No results for input(s): CKTOTAL, CKMB, CKMBINDEX, TROPONINI in the last 168 hours. BNP: Invalid input(s): POCBNP CBG: No results for input(s): GLUCAP in the last 168 hours. HbA1C: No results for input(s): HGBA1C in the last 72 hours. Urine analysis:    Component Value Date/Time   COLORURINE STRAW (A) 05/06/2017 1750   APPEARANCEUR CLEAR 05/06/2017 1750   LABSPEC 1.012 05/06/2017 1750   PHURINE 6.0 05/06/2017 1750   GLUCOSEU NEGATIVE 05/06/2017 1750   HGBUR NEGATIVE 05/06/2017 1750   BILIRUBINUR  NEGATIVE 05/06/2017 1750   KETONESUR NEGATIVE 05/06/2017 1750   PROTEINUR NEGATIVE 05/06/2017 1750   UROBILINOGEN 1.0 12/22/2007 1456   NITRITE NEGATIVE 05/06/2017 1750   LEUKOCYTESUR NEGATIVE 05/06/2017 1750   Sepsis Labs: @LABRCNTIP (procalcitonin:4,lacticidven:4) ) Recent Results (from the past 240 hour(s))  Blood Culture (routine x 2)     Status: None (Preliminary result)   Collection Time: 05/06/17  5:56 PM  Result Value Ref Range Status   Specimen Description BLOOD RIGHT HAND  Final   Special Requests   Final    BOTTLES DRAWN AEROBIC AND ANAEROBIC Blood Culture adequate volume   Culture NO GROWTH < 24 HOURS  Final   Report Status PENDING  Incomplete  Blood Culture (routine x 2)     Status: None (Preliminary result)  Collection Time: 05/06/17  6:00 PM  Result Value Ref Range Status   Specimen Description BLOOD LEFT ARM  Final   Special Requests   Final    BOTTLES DRAWN AEROBIC AND ANAEROBIC Blood Culture adequate volume   Culture NO GROWTH < 24 HOURS  Final   Report Status PENDING  Incomplete  MRSA PCR Screening     Status: None   Collection Time: 05/06/17 11:10 PM  Result Value Ref Range Status   MRSA by PCR NEGATIVE NEGATIVE Final    Comment:        The GeneXpert MRSA Assay (FDA approved for NASAL specimens only), is one component of a comprehensive MRSA colonization surveillance program. It is not intended to diagnose MRSA infection nor to guide or monitor treatment for MRSA infections.      Scheduled Meds: . aspirin  325 mg Oral Daily  . docusate sodium  100 mg Oral BID  . donepezil  10 mg Oral QHS  . enoxaparin (LOVENOX) injection  40 mg Subcutaneous Q24H  . latanoprost  1 drop Both Eyes QHS  . memantine  10 mg Oral BID  . pantoprazole  40 mg Oral BID AC   Continuous Infusions: . sodium chloride Stopped (05/06/17 1850)  . piperacillin-tazobactam (ZOSYN)  IV 3.375 g (05/08/17 0900)    Procedures/Studies: Ct Angio Head W Or Wo Contrast  Result  Date: 04/30/2017 CLINICAL DATA:  Acute encephalopathy and syncopal episode. Multiple small acute brainstem and posterior fossa infarcts. EXAM: CT ANGIOGRAPHY HEAD AND NECK TECHNIQUE: Multidetector CT imaging of the head and neck was performed using the standard protocol during bolus administration of intravenous contrast. Multiplanar CT image reconstructions and MIPs were obtained to evaluate the vascular anatomy. Carotid stenosis measurements (when applicable) are obtained utilizing NASCET criteria, using the distal internal carotid diameter as the denominator. CONTRAST:  50 mL Isovue 370 COMPARISON:  Brain MRI 04/28/2017 FINDINGS: CT HEAD FINDINGS Brain: No hemorrhage or mass effect. There is faint hypoattenuation at the sites of diffusion restriction described on recent MRI. There is periventricular hypoattenuation compatible with chronic microvascular disease. Vascular: No hyperdense vessel or unexpected calcification. Skull: Normal visualized skull base, calvarium and extracranial soft tissues. Sinuses/Orbits: No sinus fluid levels or advanced mucosal thickening. No mastoid effusion. Normal orbits. CTA NECK FINDINGS Aortic arch: There is no aneurysm or dissection of the visualized ascending aorta or aortic arch. There is a normal 3 vessel branching pattern. The visualized proximal subclavian arteries are normal. Right carotid system: The right common carotid origin is widely patent. There is no common carotid or internal carotid artery dissection or aneurysm. No hemodynamically significant stenosis. Left carotid system: The left common carotid origin is widely patent. There is no common carotid or internal carotid artery dissection or aneurysm. No hemodynamically significant stenosis. Vertebral arteries: The vertebral system is left dominant. There is calcification in the left P1 segment. The right vertebral artery is diminutive along its entire course. There is no flow demonstrated in the right V4 segment.  Skeleton: There is no bony spinal canal stenosis. No lytic or blastic lesions. Other neck: The nasopharynx is clear. The oropharynx and hypopharynx are normal. The epiglottis is normal. The supraglottic larynx, glottis and subglottic larynx are normal. No retropharyngeal collection. The parapharyngeal spaces are preserved. The parotid and submandibular glands are normal. No sialolithiasis or salivary ductal dilatation. The thyroid gland is normal. There is no cervical lymphadenopathy. Upper chest: There is ground-glass opacity in the posterior right upper lobe. Small bilateral pleural effusions with  associated atelectasis. Review of the MIP images confirms the above findings CTA HEAD FINDINGS Anterior circulation: --Intracranial internal carotid arteries: Normal. --Anterior cerebral arteries: Normal. --Middle cerebral arteries: Normal. --Posterior communicating arteries: Present on the right. Posterior circulation: --Posterior cerebral arteries: Normal. --Superior cerebellar arteries: Normal. --Basilar artery: Normal. --Anterior inferior cerebellar arteries: Normal. --Posterior inferior cerebellar arteries: Left dominant. Venous sinuses: As permitted by contrast timing, patent. Anatomic variants: None Delayed phase: No parenchymal contrast enhancement. Review of the MIP images confirms the above findings IMPRESSION: 1. No emergent large vessel occlusion. 2. No hemodynamically significant stenosis of the major cervical arteries. 3. Congenitally diminutive right vertebral artery. Electronically Signed   By: Ulyses Jarred M.D.   On: 04/30/2017 21:22   Dg Chest 1 View  Result Date: 04/27/2017 CLINICAL DATA:  Found unresponsive. EXAM: CHEST 1 VIEW COMPARISON:  Chest x-ray dated 12/25/2007. FINDINGS: Heart size is upper normal. Overall cardiomediastinal silhouette is stable in size and configuration. Lungs are clear. No pleural effusion or pneumothorax seen. Osseous and soft tissue structures about the chest are  unremarkable. IMPRESSION: No active disease. Electronically Signed   By: Franki Cabot M.D.   On: 04/27/2017 20:50   Ct Head Wo Contrast  Result Date: 05/06/2017 CLINICAL DATA:  Hypothermia.  Altered mental status. EXAM: CT HEAD WITHOUT CONTRAST TECHNIQUE: Contiguous axial images were obtained from the base of the skull through the vertex without intravenous contrast. COMPARISON:  Head CT 04/30/2017, 04/27/2017.  Brain MRI 04/28/2017 FINDINGS: Brain: No acute intracranial hemorrhage. Small acute infarcts on prior MRI are not well seen by CT. Stable degree of atrophy and chronic small vessel ischemia. Remote lacunar infarcts in the right thalamus. No extra-axial fluid collection. Vascular: Atherosclerosis of skullbase vasculature without hyperdense vessel or abnormal calcification. Skull: No fracture or focal lesion. Sinuses/Orbits: Paranasal sinuses and mastoid air cells are clear. The visualized orbits are unremarkable. Bilateral cataract resection. Other: None. IMPRESSION: No acute abnormality. Stable degree of atrophy and chronic small vessel ischemia. Recent small infarcts on MRI are not well seen by CT. Electronically Signed   By: Jeb Levering M.D.   On: 05/06/2017 20:36   Ct Head Wo Contrast  Result Date: 04/27/2017 CLINICAL DATA:  Found unresponsive earlier today. Low blood pressure. Partial resuscitation. EXAM: CT HEAD WITHOUT CONTRAST TECHNIQUE: Contiguous axial images were obtained from the base of the skull through the vertex without intravenous contrast. COMPARISON:  03/30/2015 brain MR. FINDINGS: Brain: No evidence for acute stroke, acute hemorrhage, mass lesion, or extra-axial fluid. There is extreme atrophy, with hydrocephalus ex vacuo. Extensive involvement of the white matter by hypoattenuation, representing chronic microvascular ischemic change. There is an old RIGHT thalamus lacunar infarct. Vascular: No hyperdense vessel or unexpected calcification. Skull: Normal. Negative for fracture  or focal lesion. Sinuses/Orbits: No acute findings or layering fluid. BILATERAL cataract extraction. Other: None.  Compared with prior brain MR, similar appearance. IMPRESSION: Severe atrophy and extensive white matter disease. Evidence for chronic cerebral infarction. No acute intracranial findings are evident. Electronically Signed   By: Staci Righter M.D.   On: 04/27/2017 20:22   Ct Angio Neck W Or Wo Contrast  Result Date: 04/30/2017 CLINICAL DATA:  Acute encephalopathy and syncopal episode. Multiple small acute brainstem and posterior fossa infarcts. EXAM: CT ANGIOGRAPHY HEAD AND NECK TECHNIQUE: Multidetector CT imaging of the head and neck was performed using the standard protocol during bolus administration of intravenous contrast. Multiplanar CT image reconstructions and MIPs were obtained to evaluate the vascular anatomy. Carotid stenosis measurements (when applicable)  are obtained utilizing NASCET criteria, using the distal internal carotid diameter as the denominator. CONTRAST:  50 mL Isovue 370 COMPARISON:  Brain MRI 04/28/2017 FINDINGS: CT HEAD FINDINGS Brain: No hemorrhage or mass effect. There is faint hypoattenuation at the sites of diffusion restriction described on recent MRI. There is periventricular hypoattenuation compatible with chronic microvascular disease. Vascular: No hyperdense vessel or unexpected calcification. Skull: Normal visualized skull base, calvarium and extracranial soft tissues. Sinuses/Orbits: No sinus fluid levels or advanced mucosal thickening. No mastoid effusion. Normal orbits. CTA NECK FINDINGS Aortic arch: There is no aneurysm or dissection of the visualized ascending aorta or aortic arch. There is a normal 3 vessel branching pattern. The visualized proximal subclavian arteries are normal. Right carotid system: The right common carotid origin is widely patent. There is no common carotid or internal carotid artery dissection or aneurysm. No hemodynamically significant  stenosis. Left carotid system: The left common carotid origin is widely patent. There is no common carotid or internal carotid artery dissection or aneurysm. No hemodynamically significant stenosis. Vertebral arteries: The vertebral system is left dominant. There is calcification in the left P1 segment. The right vertebral artery is diminutive along its entire course. There is no flow demonstrated in the right V4 segment. Skeleton: There is no bony spinal canal stenosis. No lytic or blastic lesions. Other neck: The nasopharynx is clear. The oropharynx and hypopharynx are normal. The epiglottis is normal. The supraglottic larynx, glottis and subglottic larynx are normal. No retropharyngeal collection. The parapharyngeal spaces are preserved. The parotid and submandibular glands are normal. No sialolithiasis or salivary ductal dilatation. The thyroid gland is normal. There is no cervical lymphadenopathy. Upper chest: There is ground-glass opacity in the posterior right upper lobe. Small bilateral pleural effusions with associated atelectasis. Review of the MIP images confirms the above findings CTA HEAD FINDINGS Anterior circulation: --Intracranial internal carotid arteries: Normal. --Anterior cerebral arteries: Normal. --Middle cerebral arteries: Normal. --Posterior communicating arteries: Present on the right. Posterior circulation: --Posterior cerebral arteries: Normal. --Superior cerebellar arteries: Normal. --Basilar artery: Normal. --Anterior inferior cerebellar arteries: Normal. --Posterior inferior cerebellar arteries: Left dominant. Venous sinuses: As permitted by contrast timing, patent. Anatomic variants: None Delayed phase: No parenchymal contrast enhancement. Review of the MIP images confirms the above findings IMPRESSION: 1. No emergent large vessel occlusion. 2. No hemodynamically significant stenosis of the major cervical arteries. 3. Congenitally diminutive right vertebral artery. Electronically  Signed   By: Ulyses Jarred M.D.   On: 04/30/2017 21:22   Mr Jodene Nam Head Wo Contrast  Result Date: 05/07/2017 CLINICAL DATA:  Hypothermia. Altered mental status. History of dementia. EXAM: MRI HEAD WITHOUT CONTRAST MRA HEAD WITHOUT CONTRAST TECHNIQUE: Multiplanar, multiecho pulse sequences of the brain and surrounding structures were obtained without intravenous contrast. Angiographic images of the head were obtained using MRA technique without contrast. COMPARISON:  04/28/2017 FINDINGS: MRI HEAD FINDINGS Brain: Subacute right lateral medullary infarct appears more extensive than on prior. A subacute left cerebellar infarct on the previous study has decreased in restriction intensity. Cerebral cortical infarcts seen previously are no longer visualized. There are numerous small remote bilateral cerebellar infarcts. Remote lacunar infarct in the right thalamus. Atrophy with ventriculomegaly, especially of the temporal and occipital lobes. There is chronic confluent FLAIR hyperintensity in the cerebral white matter with remote lacunes in the bilateral centrum semiovale. The corpus callosum is thinned the setting of cerebral volume loss. Numerous foci of chronic lobar microhemorrhage. Vascular: Arterial findings below. Normal dural venous sinus flow voids. Skull and upper cervical spine:  Negative for marrow lesion. Sinuses/Orbits: No acute finding.  Bilateral cataract resection. MRA HEAD FINDINGS Solitary visible left vertebral artery, stable from CTA 04/28/2017. The right vertebral artery is hypoplastic and may be occluded at the V4 segment. Mild proximal basilar atheromatous type narrowing. The right P1 segment is hypoplastic. Right P2 segment is attenuated and appears diffusely irregular, apparently new from prior but potentially artifactual on this motion degraded study. No reversible flow limiting stenosis in the anterior circulation. There is moderate irregularity of bilateral MCA branches, again potentially  related to artifact rather than atherosclerosis. IMPRESSION: 1. Subacute right lateral medullary infarct with questionable increased extent compared to 04/28/2017 MRI. Subacute infarcts in the left cerebellum and bilateral cerebral cortex are fading. No new site of ischemia; no hemorrhagic conversion. 2. Motion degraded MRA which may account for apparent newly narrowed bilateral M2 and right P2 segments when compared to CTA 04/30/2017. 3. Advanced atrophy and chronic white matter disease. Remote lobar micro hemorrhages may reflect underlying amyloid angiography. Electronically Signed   By: Monte Fantasia M.D.   On: 05/07/2017 10:31   Mr Brain Wo Contrast  Result Date: 05/07/2017 CLINICAL DATA:  Hypothermia. Altered mental status. History of dementia. EXAM: MRI HEAD WITHOUT CONTRAST MRA HEAD WITHOUT CONTRAST TECHNIQUE: Multiplanar, multiecho pulse sequences of the brain and surrounding structures were obtained without intravenous contrast. Angiographic images of the head were obtained using MRA technique without contrast. COMPARISON:  04/28/2017 FINDINGS: MRI HEAD FINDINGS Brain: Subacute right lateral medullary infarct appears more extensive than on prior. A subacute left cerebellar infarct on the previous study has decreased in restriction intensity. Cerebral cortical infarcts seen previously are no longer visualized. There are numerous small remote bilateral cerebellar infarcts. Remote lacunar infarct in the right thalamus. Atrophy with ventriculomegaly, especially of the temporal and occipital lobes. There is chronic confluent FLAIR hyperintensity in the cerebral white matter with remote lacunes in the bilateral centrum semiovale. The corpus callosum is thinned the setting of cerebral volume loss. Numerous foci of chronic lobar microhemorrhage. Vascular: Arterial findings below. Normal dural venous sinus flow voids. Skull and upper cervical spine: Negative for marrow lesion. Sinuses/Orbits: No acute finding.   Bilateral cataract resection. MRA HEAD FINDINGS Solitary visible left vertebral artery, stable from CTA 04/28/2017. The right vertebral artery is hypoplastic and may be occluded at the V4 segment. Mild proximal basilar atheromatous type narrowing. The right P1 segment is hypoplastic. Right P2 segment is attenuated and appears diffusely irregular, apparently new from prior but potentially artifactual on this motion degraded study. No reversible flow limiting stenosis in the anterior circulation. There is moderate irregularity of bilateral MCA branches, again potentially related to artifact rather than atherosclerosis. IMPRESSION: 1. Subacute right lateral medullary infarct with questionable increased extent compared to 04/28/2017 MRI. Subacute infarcts in the left cerebellum and bilateral cerebral cortex are fading. No new site of ischemia; no hemorrhagic conversion. 2. Motion degraded MRA which may account for apparent newly narrowed bilateral M2 and right P2 segments when compared to CTA 04/30/2017. 3. Advanced atrophy and chronic white matter disease. Remote lobar micro hemorrhages may reflect underlying amyloid angiography. Electronically Signed   By: Monte Fantasia M.D.   On: 05/07/2017 10:31   Mr Brain Wo Contrast  Result Date: 04/28/2017 CLINICAL DATA:  Found unresponsive. Syncope and collapse. Prior stroke. EXAM: MRI HEAD WITHOUT CONTRAST TECHNIQUE: Multiplanar, multiecho pulse sequences of the brain and surrounding structures were obtained without intravenous contrast. COMPARISON:  Head CT 04/27/2017 and MRI 03/30/2015 FINDINGS: Brain: Small acute infarcts are  present in the right medulla and posteroinferior left cerebellar hemisphere. There are also punctate acute cortical infarcts in the high posterior frontal lobes bilaterally. Scattered chronic cerebral and cerebellar microhemorrhages are again seen. A small chronic right occipital cortical infarct is new or larger than on the prior MRI. There also  may be a tiny left occipital chronic cortical infarct. Patchy to confluent cerebral white matter T2 hyperintensities are similar to the prior MRI and nonspecific but compatible with extensive chronic small vessel ischemic disease. There are numerous small chronic infarcts in the cerebellum bilaterally. Chronic lacunar infarcts are also again seen in the anterior left centrum semiovale and right thalamus. There is marked cerebral atrophy. No mass, midline shift, or extra-axial fluid collection is seen. Vascular: Unchanged poor visualization of the distal right vertebral artery, possibly occluded. Dominant appearing left vertebral artery with grossly preserved vascular flow voids elsewhere. Skull and upper cervical spine: Unremarkable bone marrow signal. Sinuses/Orbits: Bilateral cataract extraction. Paranasal sinuses and mastoid air cells are clear. Other: None. IMPRESSION: 1. 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 2. Advanced cerebral atrophy and chronic small vessel ischemic disease with chronic infarcts as above. Electronically Signed   By: Logan Bores M.D.   On: 04/28/2017 12:13   US Carotid Bilateral (at [facility] Only)  Result Date: 04/29/2017 CLINICAL DATA:  Stroke.  Syncopal episode. EXAM: BILATERAL CAROTID DUPLEX ULTRASOUND TECHNIQUE: Pearline Cables scale imaging, color Doppler and duplex ultrasound were performed of bilateral carotid and vertebral arteries in the neck. COMPARISON:  None. FINDINGS: Criteria: Quantification of carotid stenosis is based on velocity parameters that correlate the residual internal carotid diameter with NASCET-based stenosis levels, using the diameter of the distal internal carotid lumen as the denominator for stenosis measurement. The following velocity measurements were obtained: RIGHT ICA:  45/6 cm/sec CCA:  401/02 cm/sec SYSTOLIC ICA/CCA RATIO:  0.4 DIASTOLIC ICA/CCA RATIO:  0.4 ECA:  105 cm/sec LEFT ICA:  78/6 cm/sec CCA:  725/36  cm/sec SYSTOLIC ICA/CCA RATIO:  0.7 DIASTOLIC ICA/CCA RATIO:  0.6 ECA:  102 cm/sec RIGHT CAROTID ARTERY: There is a very minimal amount of intimal thickening within the right carotid bulb (image 17)), extending to involve the origin and proximal aspects of the right internal carotid artery (image 25), not resulting in elevated peak systolic velocities within the interrogated course the right internal carotid artery to suggest a hemodynamically significant stenosis. RIGHT VERTEBRAL ARTERY:  Antegrade flow LEFT CAROTID ARTERY: There is a moderate amount of eccentric mixed echogenic plaque within the left carotid bulb (images 49 and 51), not resulting in elevated peak systolic velocities within the interrogated course of the left internal carotid artery to suggest a hemodynamically significant stenosis. LEFT VERTEBRAL ARTERY:  Antegrade flow IMPRESSION: 1. Moderate amount of left-sided atherosclerotic plaque, not resulting in a hemodynamically significant stenosis. 2. Minimal amount of right-sided intimal wall thickening, not resulting in a hemodynamically significant stenosis. Electronically Signed   By: Sandi Mariscal M.D.   On: 04/29/2017 11:56   Dg Chest Port 1 View  Result Date: 05/06/2017 CLINICAL DATA:  Hypoxia EXAM: PORTABLE CHEST 1 VIEW COMPARISON:  April 27, 2017 FINDINGS: The heart size and mediastinal contours are stable. The aorta is tortuous. Both lungs are clear. Degenerative joint changes of the shoulders are noted. IMPRESSION: No active cardiopulmonary disease. Electronically Signed   By: Abelardo Diesel M.D.   On: 05/06/2017 18:18    Arrion Burruel, DO  [facility] Pager [phone]  If 7PM-7AM, please contact night-coverage  [email or web address] Password [email or web address] 05/08/2017, 9:14 AM   LOS: 1 day

## 2017-05-08 NOTE — Clinical Social Work Note (Signed)
Clinical Social Work Assessment  Patient Details  Name: [name] MRN: [id number] Date of Birth: [birth year]  Date of referral:  05/08/17               Reason for consult:  Discharge Planning                Permission sought to share information with:  Case Manager, Facility Sport and exercise psychologist, Family Supports Permission granted to share information::  Yes, Verbal Permission Granted  Name::     [name], brother (POA)  Agency::     Relationship::     Contact Information:     Housing/Transportation Living arrangements for the past 2 months:  [place] of Information:  Scientist, water quality, Tourist information centre manager, Facility, Other (Comment Required) (brother) Patient Interpreter Needed:  None Criminal Activity/Legal Involvement Pertinent to Current Situation/Hospitalization:  No - Comment as needed Significant Relationships:  Adult Children, Other Family Members, Community Support, Friend Lives with:  Facility Resident Do you feel safe going back to the place where you live?  Yes Need for family participation in patient care:  Yes (Comment) (Dementia/Confusion)  Care giving concerns:  Patient admitted to [facility] ICU for medical concerns per the facility.  Recently patient was placed at [facility] where he is receiving short term rehab under his [place].   Per facility, they are aware of admission and welcome patient back with no barriers. Report they will need another PT note, to re-auth patient in effort to continue his rehab and physical therapy.  Call placed to patient brother [name] and he too is in agreement with [place] return and happy with care along with physical therapy.  Reports he will be an EMS at [facility] as he will not be able to provide transport for patient.    Social Worker assessment / plan:  Assessment completed with facility and brother as patient is confused, agitated and unable to provide history.  Plan: return to [facility] at discharge. Need PT  note LCSW updated FL2.  Employment status:  Retired Nurse, adult PT Recommendations:  [place] (return) Information / Referral to community resources:     Patient/Family's Response to care:  Brother understanding of plan and updated on current care. No concerns.  Patient/Family's Understanding of and Emotional Response to Diagnosis, Current Treatment, and Prognosis:  Brother reports he is aware of reason for admission and understands reason for return to SNF as he reports patient was very weak when recently placed and needed more care than family able to provide.  Emotional Assessment Appearance:  Appears stated age Attitude/Demeanor/Rapport:  Unable to Assess Affect (typically observed):  Unable to Assess Orientation:  Oriented to Self Alcohol / Substance use:  Not Applicable Psych involvement (Current and /or in the community):  No (Comment)  Discharge Needs  Concerns to be addressed:  No discharge needs identified Readmission within the last 30 days:  No Current discharge risk:  None Barriers to Discharge:  No Barriers Identified   Lilly Cove, LCSW 05/08/2017, 2:37 PM

## 2017-05-08 NOTE — NC FL2 (Signed)
[place] LEVEL OF CARE SCREENING TOOL     IDENTIFICATION  Patient Name: [name] Birthdate: June 01, 1932 Sex: male Admission Date (Current Location): 05/06/2017  [facility] and [place] Number:  [employer] and Address:  [place] [address], [place]      Provider Number: [id number]  Attending Physician Name and Address:  Orson Eva, MD  Relative Name and Phone Number:       Current Level of Care: Hospital Recommended Level of Care: [place] Prior Approval Number:    Date Approved/Denied:   PASRR Number: [id number]  Discharge Plan: SNF    Current Diagnoses: Patient Active Problem List   Diagnosis Date Noted  . Aspiration pneumonitis ([place])   . Hypothermia 05/06/2017  . Episode of unresponsiveness   . CVA (cerebral vascular accident) ([place]) 04/30/2017  . UTI (urinary tract infection) 04/28/2017  . Renal failure syndrome   . Syncope and collapse 04/27/2017  . Dehydration 04/27/2017  . Dementia 04/27/2017  . Rhabdomyolysis 04/27/2017  . Femur fracture, left ([place]) 07/03/2014  . Bradycardia 07/03/2014  . Abdominal pain, other specified site 10/14/2013  . Nausea & vomiting 10/14/2013    Orientation RESPIRATION BLADDER Height & Weight     Self  Normal Incontinent, External catheter Weight: 139 lb 1.8 oz (63.1 kg) Height:  5\' 10"  (177.8 cm)  BEHAVIORAL SYMPTOMS/MOOD NEUROLOGICAL BOWEL NUTRITION STATUS     Dementia  Continent  Dys. 1 Diet  Nectar Thick Liquid              AMBULATORY STATUS COMMUNICATION OF NEEDS Skin   Extensive Assist Verbally Normal                       Personal Care Assistance Level of Assistance  Bathing, Feeding, Dressing Bathing Assistance: Maximum assistance Feeding assistance: Limited assistance Dressing Assistance: Maximum assistance     Functional Limitations Info  Sight Sight Info: Impaired        SPECIAL CARE FACTORS FREQUENCY  PT (By licensed  PT), OT (By licensed OT)     Speech Therapy     PT Frequency: 5x OT Frequency: 5x    2x/week        Contractures Contractures Info: Not present    Additional Factors Info  Code Status, Allergies Code Status Info: DNR Allergies Info: NKA           Current Medications (05/08/2017):  This is the current hospital active medication list Current Facility-Administered Medications  Medication Dose Route Frequency Provider Last Rate Last Dose  . 0.9 %  sodium chloride infusion  1,000 mL Intravenous Continuous Fredia Sorrow, MD 75 mL/hr at 05/08/17 1302 1,000 mL at 05/08/17 1302  . acetaminophen (TYLENOL) tablet 650 mg  650 mg Oral Q6H PRN Karmen Bongo, MD       Or  . acetaminophen (TYLENOL) suppository 650 mg  650 mg Rectal Q6H PRN Karmen Bongo, MD      . aspirin tablet 325 mg  325 mg Oral Daily Karmen Bongo, MD   325 mg at 05/08/17 0805  . docusate sodium (COLACE) capsule 100 mg  100 mg Oral BID Karmen Bongo, MD   100 mg at 05/08/17 0805  . donepezil (ARICEPT) tablet 10 mg  10 mg Oral Ivery Quale, MD   10 mg at 05/07/17 2139  . enoxaparin (LOVENOX) injection 40 mg  40 mg Subcutaneous Q24H Karmen Bongo, MD   40 mg at 05/07/17  2254  . haloperidol lactate (HALDOL) injection 5 mg  5 mg Intravenous Q6H PRN Orson Eva, MD   5 mg at 05/08/17 0847  . latanoprost (XALATAN) 0.005 % ophthalmic solution 1 drop  1 drop Both Eyes QHS Karmen Bongo, MD   1 drop at 05/07/17 2139  . memantine (NAMENDA) tablet 10 mg  10 mg Oral BID Karmen Bongo, MD   10 mg at 05/08/17 0805  . ondansetron (ZOFRAN) tablet 4 mg  4 mg Oral Q6H PRN Karmen Bongo, MD       Or  . ondansetron [facility]) injection 4 mg  4 mg Intravenous Q6H PRN Karmen Bongo, MD   4 mg at 05/08/17 0847  . pantoprazole (PROTONIX) EC tablet 40 mg  40 mg Oral BID Iona Beard, MD   40 mg at 05/08/17 0805  . piperacillin-tazobactam (ZOSYN) IVPB 3.375 g  3.375 g Intravenous Lynne Logan, MD   Stopped  at 05/08/17 1300     Discharge Medications: Please see discharge summary for a list of discharge medications.  Relevant Imaging Results:  Relevant Lab Results:   Additional Information SS#: [id number]  Lilly Cove, LCSW

## 2017-05-09 ENCOUNTER — Encounter (HOSPITAL_COMMUNITY): Payer: Self-pay | Admitting: Primary Care

## 2017-05-09 DIAGNOSIS — Z7189 Other specified counseling: Secondary | ICD-10-CM

## 2017-05-09 DIAGNOSIS — Z515 Encounter for palliative care: Secondary | ICD-10-CM

## 2017-05-09 LAB — CBC
HEMATOCRIT: 37.6 % — AB (ref 39.0–52.0)
HEMOGLOBIN: 12.4 g/dL — AB (ref 13.0–17.0)
MCH: 30.8 pg (ref 26.0–34.0)
MCHC: 33 g/dL (ref 30.0–36.0)
MCV: 93.3 fL (ref 78.0–100.0)
Platelets: 251 10*3/uL (ref 150–400)
RBC: 4.03 MIL/uL — AB (ref 4.22–5.81)
RDW: 15.6 % — ABNORMAL HIGH (ref 11.5–15.5)
WBC: 8.9 10*3/uL (ref 4.0–10.5)

## 2017-05-09 LAB — BASIC METABOLIC PANEL
ANION GAP: 8 (ref 5–15)
BUN: 12 mg/dL (ref 6–20)
CO2: 26 mmol/L (ref 22–32)
Calcium: 8.9 mg/dL (ref 8.9–10.3)
Chloride: 104 mmol/L (ref 101–111)
Creatinine, Ser: 1.06 mg/dL (ref 0.61–1.24)
GLUCOSE: 90 mg/dL (ref 65–99)
POTASSIUM: 3.9 mmol/L (ref 3.5–5.1)
Sodium: 138 mmol/L (ref 135–145)

## 2017-05-09 MED ORDER — COSYNTROPIN 0.25 MG IJ SOLR
0.2500 mg | Freq: Once | INTRAMUSCULAR | Status: AC
  Administered 2017-05-10: 0.25 mg via INTRAVENOUS
  Filled 2017-05-09: qty 0.25

## 2017-05-09 NOTE — Progress Notes (Signed)
Pharmacy Antibiotic Note  [name] is a 83 y.o. male admitted on 05/06/2017 with sepsis.  Pharmacy has been consulted for ZOSYN dosing.  Plan: Zosyn 3.375g IV q8h (4 hour infusion).  Monitor labs, progress, c/s  Height: 5\' 10"  (177.8 cm) Weight: 143 lb 8.3 oz (65.1 kg) IBW/kg (Calculated) : 73  Temp (24hrs), Avg:97.8 F (36.6 C), Min:97.1 F (36.2 C), Max:98.7 F (37.1 C)   Recent Labs Lab 05/06/17 1756 05/06/17 1800 05/06/17 2117 05/07/17 0005 05/07/17 0300 05/08/17 0555 05/09/17 0442 05/09/17 0531  WBC 6.4  --   --   --  5.3  --   --  8.9  CREATININE 0.75  --   --   --  0.88 1.08 1.06  --   LATICACIDVEN  --  0.8 0.6 0.6 0.7  --   --   --     Estimated Creatinine Clearance: 48.6 mL/min (by C-G formula based on SCr of 1.06 mg/dL).    No Known Allergies  Antimicrobials this admission: Zosyn 7/3 >>  Vanc 7/3 >> 7/4  Dose adjustments this admission:  Microbiology results:  BCx: peding  UCx:    Sputum:    MRSA PCR: negative  Thank you for allowing pharmacy to be a part of this patient's care.  Hart Robinsons A 05/09/2017 11:41 AM

## 2017-05-09 NOTE — Consult Note (Signed)
Consultation Note Date: 05/09/2017   Patient Name: [name]  DOB: [birth year]  MRN: [id number]  Age / Sex: 83 y.o., male  PCP: Dione Housekeeper, MD Referring Physician: Orson Eva, MD  Reason for Consultation: Establishing goals of care and Psychosocial/spiritual support  HPI/Patient Profile: 83 y.o. male  with past medical history of Vascular dementia for or 2+ years, history of prostate cancer with seed implant 2004, history of recent stroke 04/2017, history of left femur fracture August 2015 admitted on 05/06/2017 with hyperthermia of uncertain etiology.   Clinical Assessment and Goals of Care: Mr. Schill is resting quietly in bed. His paid caregiver and sister-in-law Steward Drone Akerson are feeding him and washing his face. Present today at bedside is brother/legal guardian Banyan Goodchild, and Mr. Zaldivar's nephew and niece. Mr. Thielke is able to make eye contact when requested, but does not keep eye contact. He denies issues or concerns, denies pain. He states he wants to go home. When I ask where,  he states, "to my house".  Brother [employer] and I talk about meeting to discuss what's next for Mr. Kitchings. I share that I am there to be an extra layer support, review labs and images, help understand what's happening and their choices. [name] states that he would like for Mr. Mayer's signs Georgina Snell and Nicole Kindred to be present. We set up a family meeting for 7/6 at 11 AM. I share that we are concerned that Mr. Briley is unable to maintain his core body temperature, and that he is not taking in enough nutrition. I share that we will talk more during our family visit tomorrow, reassuring family that we are continuing to treat Mr. Frericks.  No questions or concerns at this time.   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 Prior to this stroke he was living in his own home with an aide 5 hours per day.  Healthcare power of attorney LEGAL  GUARDIAN - Mr. Gleaves has had a legal guardian, his brother Rockie Neighbours, for approximately 2 years (since his diagnosis of vascular dementia).   SUMMARY OF RECOMMENDATIONS   at this point, continue to treat the treatable but now CPR or intubation.  Code Status/Advance Care Planning:  DNR  Symptom Management:  per hospitalist, no additional needs at this time.   Palliative Prophylaxis:   Aspiration, Oral Care and Turn Reposition  Additional Recommendations (Limitations, Scope, Preferences):  Treat the treatable but no CPR or intubation.  Psycho-social/Spiritual:   Desire for further Chaplaincy support:no  Additional Recommendations: Caregiving  Support/Resources  Prognosis:   < 3 months, or less would not be surprising based on recent stroke, decreased functional status, possible evolution or extension of stroke, poor PO intake, unable to maintain core body temperature.  Discharge Planning: Based on outcomes, likely return to [facility] SN[facility]      Primary Diagnoses: Present on Admission: . Hypothermia . Bradycardia . CVA (cerebral vascular accident) ([place]) . Dementia   I have reviewed the medical record, interviewed the patient and family, and  examined the patient. The following aspects are pertinent.  Past Medical History:  Diagnosis Date  . CVA (cerebral vascular accident) ([place]) 04/2017  . Glaucoma   . Memory loss   . Prostate cancer ([place]) 2004  . Tubular adenoma polyp of rectum 10/08/2013   Social History   Social History  . Marital status: Single    Spouse name: N/A  . Number of children: 2  . Years of education: 14   Occupational History  . Retired    Social History Main Topics  . Smoking status: Never Smoker  . Smokeless tobacco: Never Used  . Alcohol use No     Comment: 1 drink a month  . Drug use: No  . Sexual activity: Not Asked   Other Topics Concern  . None   Social History Narrative   Lives at home alone.   Right-handed.   No  caffeine use.      Family History  Problem Relation Age of Onset  . Ovarian cancer Mother   . Heart disease Father   . Aneurysm Father    Scheduled Meds: . aspirin  325 mg Oral Daily  . chlorhexidine  15 mL Mouth Rinse BID  . docusate sodium  100 mg Oral BID  . donepezil  10 mg Oral QHS  . enoxaparin (LOVENOX) injection  40 mg Subcutaneous Q24H  . latanoprost  1 drop Both Eyes QHS  . mouth rinse  15 mL Mouth Rinse q12n4p  . memantine  10 mg Oral BID  . pantoprazole  40 mg Oral BID AC   Continuous Infusions: . sodium chloride 1,000 mL (05/09/17 0900)  . piperacillin-tazobactam (ZOSYN)  IV 3.375 g (05/09/17 1058)   PRN Meds:.acetaminophen **OR** acetaminophen, haloperidol lactate, ondansetron **OR** ondansetron (ZOFRAN) IV Medications Prior to Admission:  Prior to Admission medications   Medication Sig Start Date End Date Taking? Authorizing Provider  aspirin 325 MG tablet Take 1 tablet (325 mg total) by mouth daily. 05/03/17  Yes Debbe Odea, MD  Cholecalciferol (VITAMIN D) 2000 units CAPS Take 1 capsule by mouth daily.   Yes [provider]  donepezil (ARICEPT) 10 MG tablet Take 1 tablet (10 mg total) by mouth at bedtime. 08/22/16  Yes Dennie Bible, NP  furosemide (LASIX) 20 MG tablet Take 20 mg by mouth daily as needed for fluid.  01/21/17  Yes [provider]  latanoprost (XALATAN) 0.005 % ophthalmic solution Place 1 drop into both eyes at bedtime.   Yes [provider]  memantine (NAMENDA) 10 MG tablet Take 1 tablet (10 mg total) by mouth 2 (two) times daily. 08/22/16  Yes Dennie Bible, NP   No Known Allergies Review of Systems  Unable to perform ROS: Mental status change    Physical Exam  Constitutional: No distress.  Frail and thin, makes eye contact when requested but does not keep.  HENT:  Head: Normocephalic and atraumatic.  Cardiovascular: Normal rate and regular rhythm.   Pulmonary/Chest: Effort normal. No  respiratory distress.  Abdominal: Soft. He exhibits no distension.  Musculoskeletal: He exhibits no edema.  Neurological: He is alert.  Known dementia  Skin: Skin is warm and dry.  Nursing note and vitals reviewed.   Vital Signs: BP 122/60   Pulse 65   Temp (!) 96.9 F (36.1 C) (Axillary)   Resp 20   Ht 5\' 10"  (1.778 m)   Wt 65.1 kg (143 lb 8.3 oz)   SpO2 99%   BMI 20.59 kg/m  Pain Assessment:  PAINAD   Pain Score: 0-No pain   SpO2: SpO2: 99 % O2 Device:SpO2: 99 % O2 Flow Rate: .   IO: Intake/output summary:  Intake/Output Summary (Last 24 hours) at 05/09/17 1316 Last data filed at 05/09/17 0900  Gross per 24 hour  Intake             2250 ml  Output              700 ml  Net             1550 ml    LBM: Last BM Date:  (unknown) Baseline Weight: Weight: 65.8 kg (145 lb) Most recent weight: Weight: 65.1 kg (143 lb 8.3 oz)     Palliative Assessment/Data:   Flowsheet Rows     Most Recent Value  Intake Tab  Referral Department  Hospitalist  Unit at Time of Referral  ICU  Palliative Care Primary Diagnosis  Cancer  Date Notified  05/09/17  Palliative Care Type  New Palliative care  Reason for referral  Clarify Goals of Care  Date of Admission  05/06/17  Date first seen by Palliative Care  05/09/17  # of days Palliative referral response time  0 Day(s)  # of days IP prior to Palliative referral  3  Clinical Assessment  Palliative Performance Scale Score  20%  Pain Max last 24 hours  Not able to report  Pain Min Last 24 hours  Not able to report  Dyspnea Max Last 24 Hours  Not able to report  Dyspnea Min Last 24 hours  Not able to report  Psychosocial & Spiritual Assessment  Palliative Care Outcomes      Time In: 1315 Time Out: 1355 Time Total: 40 minutes Greater than 50%  of this time was spent counseling and coordinating care related to the above assessment and plan.  Signed by: Drue Novel, NP   Please contact Palliative Medicine Team phone at  [phone] for questions and concerns.  For individual provider: See Shea Evans

## 2017-05-09 NOTE — Progress Notes (Signed)
SLP Cancellation Note  Patient Details Name: [name] MRN: [id number] DOB: 01/28/1932   Cancelled treatment:         Pt not appropriate for MBSS at this time due to lethargy. Pt not alert for adequate PO intake this date. SLP will check tomorrow. Continue diet as ordered for now.  Thank you,  Genene Churn, [place]   [place] 05/09/2017, 2:49 PM

## 2017-05-09 NOTE — Progress Notes (Signed)
PROGRESS NOTE  [name] FKC:[id number] DOB: 1932/03/13 DOA: 05/06/2017 PCP: Dione Housekeeper, MD  Brief History:  83 year old male with history of remote prostate cancer, dementia, glaucoma, and recent admission 6/23-28 4 stroke who was discharged to a skilled nursing facility. The patient was transferred back to the hospital after he was found to be throwing up blood and developing hypothermia. In the emergency department, he had no further emesis. His initial temperature was 75F. He was placed on a bear hugger and started on vancomycin and Zosyn for possible sepsis. MRI demonstrates a possible subacute extension of a stroke seen during his previous hospitalization. His white blood cell count has been normal. Even though his chest x-ray was unremarkable, he has a coarse cough suggestive of pneumonia or aspiration. His urinalysis was unremarkable. Blood cultures were obtained and are pending. TSH was within normal limits and cortisol level has not yet been drawn.   Assessment/Plan: Hypothermia, likely due to underlying infection (suspect aspiration pneumonia)versus adrenal insufficiency vs extension of stroke - Discontinue vancomycin as MRSA PCR is negative - blood cultures--neg to date -  UA--neg for pyuria -  PCT <0.10 - discontinue abx and monitor - Followup cortisol level--12.4 -  7/6--repeat cortrosyn stimulation test as was not done properly due to logistical issues on 7/5 - Continue Bear hugger as needed to keep temperature within normal limits - MRI demonstrated a possible extension of one of the strokes that was seen during his previous hospitalization - SLP evaluation--cannot eval due to somnolence - Repeat CXR(after hydration)--neg - check rectal temps -remains hypothemic, requiring BEAR hugger  Hematemesis - None observed since admission--hemoglobin remained stable - BID PPI  Subacute strokes  -05/07/2017 MRI brain subacute right lateral  medullary infarct with increased extension, no new stroke. -Extension of stroke likely due to relative hypotension - PT/OT/SLP reevaluations - Continue daily aspirin - LDL was 66, A1c 5.4, ECHO with preserved EF of 60-65%, carotid duplex with 1-39% stenosis a week ago - Per Neurology, due to amyloid angiopathy and dementia, patient is not a good candidate for anticoagulation and they did not pursue further work up during his previous hospitalization -Cased was discussed with neurology, Dr. Sherin Quarry need for transfer or further workup - F/u with Dr. Leonie Man as outpatient  Bradycardia, chronic,  - may be side effect ofAricept - May be exacerbated by hypothermia -No AV blocks or pauses on telemetry--personally reviewed tele  Dementia/Goals of Care -confusion worse after recent CVAs, although patient had apparently progressive disease prior -DNR status confirmed with family -palliative care meeting 7/6    Disposition Plan:  SNF vs residential hospice Family Communication:  Brother updated at bedside 7/5--Total time spent 35 minutes.  Greater than 50% spent face to face counseling and coordinating care.   Consultants:   palliative medicine  Code Status:  DNR, palliative medicine  DVT Prophylaxis:   Nelson Lovenox   Procedures: As Listed in Progress Note Above  Antibiotics: Zosyn 7/2>>>7/5 vanco 7/2>>>7/3     Subjective: Patient is somnolent but arousable. He is pleasantly confused. He denies any chest pain or shortness of breath. No reports of respiratory distress, vomiting, diarrhea.  Objective: Vitals:   05/09/17 1430 05/09/17 1500 05/09/17 1530 05/09/17 1600  BP: 107/63 96/62 (!) 92/59 100/69  Pulse: 82 81 77 86  Resp: 12 11 10 17   Temp:      TempSrc:      SpO2: 99% 98% 99% 97%  Weight:  Height:        Intake/Output Summary (Last 24 hours) at 05/09/17 1634 Last data filed at 05/09/17 1600  Gross per 24 hour  Intake             2670 ml   Output              700 ml  Net             1970 ml   Weight change: 2 kg (4 lb 6.6 oz) Exam:   General:  Pt is alert, follows commands appropriately, not in acute distress  HEENT: No icterus, No thrush, No neck mass, [place]/AT  Cardiovascular: RRR, S1/S2, no rubs, no gallops  Respiratory: Bibasilar rales. No wheezing. Good air movement.  Abdomen: Soft/+BS, non tender, non distended, no guarding  Extremities: No edema, No lymphangitis, No petechiae, No rashes, no synovitis   Data Reviewed: I have personally reviewed following labs and imaging studies Basic Metabolic Panel:  Recent Labs Lab 05/06/17 1756 05/07/17 0300 05/08/17 0555 05/09/17 0442  NA 137 139 138 138  K 3.7 3.7 3.9 3.9  CL 104 106 102 104  CO2 27 26 27 26   GLUCOSE 112* 89 93 90  BUN 19 14 13 12   CREATININE 0.75 0.88 1.08 1.06  CALCIUM 9.1 8.7* 8.9 8.9  MG  --   --  1.8  --    Liver Function Tests:  Recent Labs Lab 05/06/17 1756  AST 44*  ALT 64*  ALKPHOS 82  BILITOT 0.6  PROT 6.6  ALBUMIN 3.3*    Recent Labs Lab 05/06/17 1756  LIPASE 25   No results for input(s): AMMONIA in the last 168 hours. Coagulation Profile:  Recent Labs Lab 05/06/17 2117  INR 0.91   CBC:  Recent Labs Lab 05/06/17 1756 05/07/17 0300 05/09/17 0531  WBC 6.4 5.3 8.9  NEUTROABS 5.0  --   --   HGB 12.9* 11.8* 12.4*  HCT 37.6* 34.7* 37.6*  MCV 91.5 91.6 93.3  PLT 269 243 251   Cardiac Enzymes: No results for input(s): CKTOTAL, CKMB, CKMBINDEX, TROPONINI in the last 168 hours. BNP: Invalid input(s): POCBNP CBG: No results for input(s): GLUCAP in the last 168 hours. HbA1C: No results for input(s): HGBA1C in the last 72 hours. Urine analysis:    Component Value Date/Time   COLORURINE STRAW (A) 05/06/2017 1750   APPEARANCEUR CLEAR 05/06/2017 1750   LABSPEC 1.012 05/06/2017 1750   PHURINE 6.0 05/06/2017 1750   GLUCOSEU NEGATIVE 05/06/2017 1750   HGBUR NEGATIVE 05/06/2017 1750   BILIRUBINUR  NEGATIVE 05/06/2017 1750   KETONESUR NEGATIVE 05/06/2017 1750   PROTEINUR NEGATIVE 05/06/2017 1750   UROBILINOGEN 1.0 12/22/2007 1456   NITRITE NEGATIVE 05/06/2017 1750   LEUKOCYTESUR NEGATIVE 05/06/2017 1750   Sepsis Labs: @LABRCNTIP (procalcitonin:4,lacticidven:4) ) Recent Results (from the past 240 hour(s))  Blood Culture (routine x 2)     Status: None (Preliminary result)   Collection Time: 05/06/17  5:56 PM  Result Value Ref Range Status   Specimen Description BLOOD RIGHT HAND  Final   Special Requests   Final    BOTTLES DRAWN AEROBIC AND ANAEROBIC Blood Culture adequate volume   Culture NO GROWTH 3 DAYS  Final   Report Status PENDING  Incomplete  Blood Culture (routine x 2)     Status: None (Preliminary result)   Collection Time: 05/06/17  6:00 PM  Result Value Ref Range Status   Specimen Description BLOOD LEFT ARM  Final   Special Requests  Final    BOTTLES DRAWN AEROBIC AND ANAEROBIC Blood Culture adequate volume   Culture NO GROWTH 3 DAYS  Final   Report Status PENDING  Incomplete  MRSA PCR Screening     Status: None   Collection Time: 05/06/17 11:10 PM  Result Value Ref Range Status   MRSA by PCR NEGATIVE NEGATIVE Final    Comment:        The GeneXpert MRSA Assay (FDA approved for NASAL specimens only), is one component of a comprehensive MRSA colonization surveillance program. It is not intended to diagnose MRSA infection nor to guide or monitor treatment for MRSA infections.      Scheduled Meds: . aspirin  325 mg Oral Daily  . chlorhexidine  15 mL Mouth Rinse BID  . [START ON 05/10/2017] cosyntropin  0.25 mg Intravenous Once  . docusate sodium  100 mg Oral BID  . donepezil  10 mg Oral QHS  . enoxaparin (LOVENOX) injection  40 mg Subcutaneous Q24H  . latanoprost  1 drop Both Eyes QHS  . mouth rinse  15 mL Mouth Rinse q12n4p  . memantine  10 mg Oral BID  . pantoprazole  40 mg Oral BID AC   Continuous Infusions: . sodium chloride 1,000 mL (05/09/17  1600)  . piperacillin-tazobactam (ZOSYN)  IV Stopped (05/09/17 1458)    Procedures/Studies: Ct Angio Head W Or Wo Contrast  Result Date: 04/30/2017 CLINICAL DATA:  Acute encephalopathy and syncopal episode. Multiple small acute brainstem and posterior fossa infarcts. EXAM: CT ANGIOGRAPHY HEAD AND NECK TECHNIQUE: Multidetector CT imaging of the head and neck was performed using the standard protocol during bolus administration of intravenous contrast. Multiplanar CT image reconstructions and MIPs were obtained to evaluate the vascular anatomy. Carotid stenosis measurements (when applicable) are obtained utilizing NASCET criteria, using the distal internal carotid diameter as the denominator. CONTRAST:  50 mL Isovue 370 COMPARISON:  Brain MRI 04/28/2017 FINDINGS: CT HEAD FINDINGS Brain: No hemorrhage or mass effect. There is faint hypoattenuation at the sites of diffusion restriction described on recent MRI. There is periventricular hypoattenuation compatible with chronic microvascular disease. Vascular: No hyperdense vessel or unexpected calcification. Skull: Normal visualized skull base, calvarium and extracranial soft tissues. Sinuses/Orbits: No sinus fluid levels or advanced mucosal thickening. No mastoid effusion. Normal orbits. CTA NECK FINDINGS Aortic arch: There is no aneurysm or dissection of the visualized ascending aorta or aortic arch. There is a normal 3 vessel branching pattern. The visualized proximal subclavian arteries are normal. Right carotid system: The right common carotid origin is widely patent. There is no common carotid or internal carotid artery dissection or aneurysm. No hemodynamically significant stenosis. Left carotid system: The left common carotid origin is widely patent. There is no common carotid or internal carotid artery dissection or aneurysm. No hemodynamically significant stenosis. Vertebral arteries: The vertebral system is left dominant. There is calcification in the  left P1 segment. The right vertebral artery is diminutive along its entire course. There is no flow demonstrated in the right V4 segment. Skeleton: There is no bony spinal canal stenosis. No lytic or blastic lesions. Other neck: The nasopharynx is clear. The oropharynx and hypopharynx are normal. The epiglottis is normal. The supraglottic larynx, glottis and subglottic larynx are normal. No retropharyngeal collection. The parapharyngeal spaces are preserved. The parotid and submandibular glands are normal. No sialolithiasis or salivary ductal dilatation. The thyroid gland is normal. There is no cervical lymphadenopathy. Upper chest: There is ground-glass opacity in the posterior right upper lobe. Small bilateral pleural  effusions with associated atelectasis. Review of the MIP images confirms the above findings CTA HEAD FINDINGS Anterior circulation: --Intracranial internal carotid arteries: Normal. --Anterior cerebral arteries: Normal. --Middle cerebral arteries: Normal. --Posterior communicating arteries: Present on the right. Posterior circulation: --Posterior cerebral arteries: Normal. --Superior cerebellar arteries: Normal. --Basilar artery: Normal. --Anterior inferior cerebellar arteries: Normal. --Posterior inferior cerebellar arteries: Left dominant. Venous sinuses: As permitted by contrast timing, patent. Anatomic variants: None Delayed phase: No parenchymal contrast enhancement. Review of the MIP images confirms the above findings IMPRESSION: 1. No emergent large vessel occlusion. 2. No hemodynamically significant stenosis of the major cervical arteries. 3. Congenitally diminutive right vertebral artery. Electronically Signed   By: Ulyses Jarred M.D.   On: 04/30/2017 21:22   Dg Chest 1 View  Result Date: 04/27/2017 CLINICAL DATA:  Found unresponsive. EXAM: CHEST 1 VIEW COMPARISON:  Chest x-ray dated 12/25/2007. FINDINGS: Heart size is upper normal. Overall cardiomediastinal silhouette is stable in size  and configuration. Lungs are clear. No pleural effusion or pneumothorax seen. Osseous and soft tissue structures about the chest are unremarkable. IMPRESSION: No active disease. Electronically Signed   By: Franki Cabot M.D.   On: 04/27/2017 20:50   Ct Head Wo Contrast  Result Date: 05/06/2017 CLINICAL DATA:  Hypothermia.  Altered mental status. EXAM: CT HEAD WITHOUT CONTRAST TECHNIQUE: Contiguous axial images were obtained from the base of the skull through the vertex without intravenous contrast. COMPARISON:  Head CT 04/30/2017, 04/27/2017.  Brain MRI 04/28/2017 FINDINGS: Brain: No acute intracranial hemorrhage. Small acute infarcts on prior MRI are not well seen by CT. Stable degree of atrophy and chronic small vessel ischemia. Remote lacunar infarcts in the right thalamus. No extra-axial fluid collection. Vascular: Atherosclerosis of skullbase vasculature without hyperdense vessel or abnormal calcification. Skull: No fracture or focal lesion. Sinuses/Orbits: Paranasal sinuses and mastoid air cells are clear. The visualized orbits are unremarkable. Bilateral cataract resection. Other: None. IMPRESSION: No acute abnormality. Stable degree of atrophy and chronic small vessel ischemia. Recent small infarcts on MRI are not well seen by CT. Electronically Signed   By: Jeb Levering M.D.   On: 05/06/2017 20:36   Ct Head Wo Contrast  Result Date: 04/27/2017 CLINICAL DATA:  Found unresponsive earlier today. Low blood pressure. Partial resuscitation. EXAM: CT HEAD WITHOUT CONTRAST TECHNIQUE: Contiguous axial images were obtained from the base of the skull through the vertex without intravenous contrast. COMPARISON:  03/30/2015 brain MR. FINDINGS: Brain: No evidence for acute stroke, acute hemorrhage, mass lesion, or extra-axial fluid. There is extreme atrophy, with hydrocephalus ex vacuo. Extensive involvement of the white matter by hypoattenuation, representing chronic microvascular ischemic change. There is an  old RIGHT thalamus lacunar infarct. Vascular: No hyperdense vessel or unexpected calcification. Skull: Normal. Negative for fracture or focal lesion. Sinuses/Orbits: No acute findings or layering fluid. BILATERAL cataract extraction. Other: None.  Compared with prior brain MR, similar appearance. IMPRESSION: Severe atrophy and extensive white matter disease. Evidence for chronic cerebral infarction. No acute intracranial findings are evident. Electronically Signed   By: Staci Righter M.D.   On: 04/27/2017 20:22   Ct Angio Neck W Or Wo Contrast  Result Date: 04/30/2017 CLINICAL DATA:  Acute encephalopathy and syncopal episode. Multiple small acute brainstem and posterior fossa infarcts. EXAM: CT ANGIOGRAPHY HEAD AND NECK TECHNIQUE: Multidetector CT imaging of the head and neck was performed using the standard protocol during bolus administration of intravenous contrast. Multiplanar CT image reconstructions and MIPs were obtained to evaluate the vascular anatomy. Carotid stenosis measurements (  when applicable) are obtained utilizing NASCET criteria, using the distal internal carotid diameter as the denominator. CONTRAST:  50 mL Isovue 370 COMPARISON:  Brain MRI 04/28/2017 FINDINGS: CT HEAD FINDINGS Brain: No hemorrhage or mass effect. There is faint hypoattenuation at the sites of diffusion restriction described on recent MRI. There is periventricular hypoattenuation compatible with chronic microvascular disease. Vascular: No hyperdense vessel or unexpected calcification. Skull: Normal visualized skull base, calvarium and extracranial soft tissues. Sinuses/Orbits: No sinus fluid levels or advanced mucosal thickening. No mastoid effusion. Normal orbits. CTA NECK FINDINGS Aortic arch: There is no aneurysm or dissection of the visualized ascending aorta or aortic arch. There is a normal 3 vessel branching pattern. The visualized proximal subclavian arteries are normal. Right carotid system: The right common carotid  origin is widely patent. There is no common carotid or internal carotid artery dissection or aneurysm. No hemodynamically significant stenosis. Left carotid system: The left common carotid origin is widely patent. There is no common carotid or internal carotid artery dissection or aneurysm. No hemodynamically significant stenosis. Vertebral arteries: The vertebral system is left dominant. There is calcification in the left P1 segment. The right vertebral artery is diminutive along its entire course. There is no flow demonstrated in the right V4 segment. Skeleton: There is no bony spinal canal stenosis. No lytic or blastic lesions. Other neck: The nasopharynx is clear. The oropharynx and hypopharynx are normal. The epiglottis is normal. The supraglottic larynx, glottis and subglottic larynx are normal. No retropharyngeal collection. The parapharyngeal spaces are preserved. The parotid and submandibular glands are normal. No sialolithiasis or salivary ductal dilatation. The thyroid gland is normal. There is no cervical lymphadenopathy. Upper chest: There is ground-glass opacity in the posterior right upper lobe. Small bilateral pleural effusions with associated atelectasis. Review of the MIP images confirms the above findings CTA HEAD FINDINGS Anterior circulation: --Intracranial internal carotid arteries: Normal. --Anterior cerebral arteries: Normal. --Middle cerebral arteries: Normal. --Posterior communicating arteries: Present on the right. Posterior circulation: --Posterior cerebral arteries: Normal. --Superior cerebellar arteries: Normal. --Basilar artery: Normal. --Anterior inferior cerebellar arteries: Normal. --Posterior inferior cerebellar arteries: Left dominant. Venous sinuses: As permitted by contrast timing, patent. Anatomic variants: None Delayed phase: No parenchymal contrast enhancement. Review of the MIP images confirms the above findings IMPRESSION: 1. No emergent large vessel occlusion. 2. No  hemodynamically significant stenosis of the major cervical arteries. 3. Congenitally diminutive right vertebral artery. Electronically Signed   By: Ulyses Jarred M.D.   On: 04/30/2017 21:22   Mr Jodene Nam Head Wo Contrast  Result Date: 05/07/2017 CLINICAL DATA:  Hypothermia. Altered mental status. History of dementia. EXAM: MRI HEAD WITHOUT CONTRAST MRA HEAD WITHOUT CONTRAST TECHNIQUE: Multiplanar, multiecho pulse sequences of the brain and surrounding structures were obtained without intravenous contrast. Angiographic images of the head were obtained using MRA technique without contrast. COMPARISON:  04/28/2017 FINDINGS: MRI HEAD FINDINGS Brain: Subacute right lateral medullary infarct appears more extensive than on prior. A subacute left cerebellar infarct on the previous study has decreased in restriction intensity. Cerebral cortical infarcts seen previously are no longer visualized. There are numerous small remote bilateral cerebellar infarcts. Remote lacunar infarct in the right thalamus. Atrophy with ventriculomegaly, especially of the temporal and occipital lobes. There is chronic confluent FLAIR hyperintensity in the cerebral white matter with remote lacunes in the bilateral centrum semiovale. The corpus callosum is thinned the setting of cerebral volume loss. Numerous foci of chronic lobar microhemorrhage. Vascular: Arterial findings below. Normal dural venous sinus flow voids. Skull and upper  cervical spine: Negative for marrow lesion. Sinuses/Orbits: No acute finding.  Bilateral cataract resection. MRA HEAD FINDINGS Solitary visible left vertebral artery, stable from CTA 04/28/2017. The right vertebral artery is hypoplastic and may be occluded at the V4 segment. Mild proximal basilar atheromatous type narrowing. The right P1 segment is hypoplastic. Right P2 segment is attenuated and appears diffusely irregular, apparently new from prior but potentially artifactual on this motion degraded study. No reversible  flow limiting stenosis in the anterior circulation. There is moderate irregularity of bilateral MCA branches, again potentially related to artifact rather than atherosclerosis. IMPRESSION: 1. Subacute right lateral medullary infarct with questionable increased extent compared to 04/28/2017 MRI. Subacute infarcts in the left cerebellum and bilateral cerebral cortex are fading. No new site of ischemia; no hemorrhagic conversion. 2. Motion degraded MRA which may account for apparent newly narrowed bilateral M2 and right P2 segments when compared to CTA 04/30/2017. 3. Advanced atrophy and chronic white matter disease. Remote lobar micro hemorrhages may reflect underlying amyloid angiography. Electronically Signed   By: Monte Fantasia M.D.   On: 05/07/2017 10:31   Mr Brain Wo Contrast  Result Date: 05/07/2017 CLINICAL DATA:  Hypothermia. Altered mental status. History of dementia. EXAM: MRI HEAD WITHOUT CONTRAST MRA HEAD WITHOUT CONTRAST TECHNIQUE: Multiplanar, multiecho pulse sequences of the brain and surrounding structures were obtained without intravenous contrast. Angiographic images of the head were obtained using MRA technique without contrast. COMPARISON:  04/28/2017 FINDINGS: MRI HEAD FINDINGS Brain: Subacute right lateral medullary infarct appears more extensive than on prior. A subacute left cerebellar infarct on the previous study has decreased in restriction intensity. Cerebral cortical infarcts seen previously are no longer visualized. There are numerous small remote bilateral cerebellar infarcts. Remote lacunar infarct in the right thalamus. Atrophy with ventriculomegaly, especially of the temporal and occipital lobes. There is chronic confluent FLAIR hyperintensity in the cerebral white matter with remote lacunes in the bilateral centrum semiovale. The corpus callosum is thinned the setting of cerebral volume loss. Numerous foci of chronic lobar microhemorrhage. Vascular: Arterial findings below.  Normal dural venous sinus flow voids. Skull and upper cervical spine: Negative for marrow lesion. Sinuses/Orbits: No acute finding.  Bilateral cataract resection. MRA HEAD FINDINGS Solitary visible left vertebral artery, stable from CTA 04/28/2017. The right vertebral artery is hypoplastic and may be occluded at the V4 segment. Mild proximal basilar atheromatous type narrowing. The right P1 segment is hypoplastic. Right P2 segment is attenuated and appears diffusely irregular, apparently new from prior but potentially artifactual on this motion degraded study. No reversible flow limiting stenosis in the anterior circulation. There is moderate irregularity of bilateral MCA branches, again potentially related to artifact rather than atherosclerosis. IMPRESSION: 1. Subacute right lateral medullary infarct with questionable increased extent compared to 04/28/2017 MRI. Subacute infarcts in the left cerebellum and bilateral cerebral cortex are fading. No new site of ischemia; no hemorrhagic conversion. 2. Motion degraded MRA which may account for apparent newly narrowed bilateral M2 and right P2 segments when compared to CTA 04/30/2017. 3. Advanced atrophy and chronic white matter disease. Remote lobar micro hemorrhages may reflect underlying amyloid angiography. Electronically Signed   By: Monte Fantasia M.D.   On: 05/07/2017 10:31   Mr Brain Wo Contrast  Result Date: 04/28/2017 CLINICAL DATA:  Found unresponsive. Syncope and collapse. Prior stroke. EXAM: MRI HEAD WITHOUT CONTRAST TECHNIQUE: Multiplanar, multiecho pulse sequences of the brain and surrounding structures were obtained without intravenous contrast. COMPARISON:  Head CT 04/27/2017 and MRI 03/30/2015 FINDINGS: Brain: Small acute  infarcts are present in the right medulla and posteroinferior left cerebellar hemisphere. There are also punctate acute cortical infarcts in the high posterior frontal lobes bilaterally. Scattered chronic cerebral and cerebellar  microhemorrhages are again seen. A small chronic right occipital cortical infarct is new or larger than on the prior MRI. There also may be a tiny left occipital chronic cortical infarct. Patchy to confluent cerebral white matter T2 hyperintensities are similar to the prior MRI and nonspecific but compatible with extensive chronic small vessel ischemic disease. There are numerous small chronic infarcts in the cerebellum bilaterally. Chronic lacunar infarcts are also again seen in the anterior left centrum semiovale and right thalamus. There is marked cerebral atrophy. No mass, midline shift, or extra-axial fluid collection is seen. Vascular: Unchanged poor visualization of the distal right vertebral artery, possibly occluded. Dominant appearing left vertebral artery with grossly preserved vascular flow voids elsewhere. Skull and upper cervical spine: Unremarkable bone marrow signal. Sinuses/Orbits: Bilateral cataract extraction. Paranasal sinuses and mastoid air cells are clear. Other: None. IMPRESSION: 1. 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 2. Advanced cerebral atrophy and chronic small vessel ischemic disease with chronic infarcts as above. Electronically Signed   By: Logan Bores M.D.   On: 04/28/2017 12:13   US Carotid Bilateral (at [facility] Only)  Result Date: 04/29/2017 CLINICAL DATA:  Stroke.  Syncopal episode. EXAM: BILATERAL CAROTID DUPLEX ULTRASOUND TECHNIQUE: Pearline Cables scale imaging, color Doppler and duplex ultrasound were performed of bilateral carotid and vertebral arteries in the neck. COMPARISON:  None. FINDINGS: Criteria: Quantification of carotid stenosis is based on velocity parameters that correlate the residual internal carotid diameter with NASCET-based stenosis levels, using the diameter of the distal internal carotid lumen as the denominator for stenosis measurement. The following velocity measurements were obtained: RIGHT ICA:  45/6 cm/sec  CCA:  160/73 cm/sec SYSTOLIC ICA/CCA RATIO:  0.4 DIASTOLIC ICA/CCA RATIO:  0.4 ECA:  105 cm/sec LEFT ICA:  78/6 cm/sec CCA:  710/62 cm/sec SYSTOLIC ICA/CCA RATIO:  0.7 DIASTOLIC ICA/CCA RATIO:  0.6 ECA:  102 cm/sec RIGHT CAROTID ARTERY: There is a very minimal amount of intimal thickening within the right carotid bulb (image 17)), extending to involve the origin and proximal aspects of the right internal carotid artery (image 25), not resulting in elevated peak systolic velocities within the interrogated course the right internal carotid artery to suggest a hemodynamically significant stenosis. RIGHT VERTEBRAL ARTERY:  Antegrade flow LEFT CAROTID ARTERY: There is a moderate amount of eccentric mixed echogenic plaque within the left carotid bulb (images 49 and 51), not resulting in elevated peak systolic velocities within the interrogated course of the left internal carotid artery to suggest a hemodynamically significant stenosis. LEFT VERTEBRAL ARTERY:  Antegrade flow IMPRESSION: 1. Moderate amount of left-sided atherosclerotic plaque, not resulting in a hemodynamically significant stenosis. 2. Minimal amount of right-sided intimal wall thickening, not resulting in a hemodynamically significant stenosis. Electronically Signed   By: Sandi Mariscal M.D.   On: 04/29/2017 11:56   Dg Chest Port 1 View  Result Date: 05/08/2017 CLINICAL DATA:  Rhonchi.  Congestion. EXAM: PORTABLE CHEST 1 VIEW COMPARISON:  05/06/2017 FINDINGS: Upright AP view of the chest was obtained. There is no focal airspace disease or pulmonary edema. There is a skin fold overlying the lateral right chest. Negative for a pneumothorax. Heart and mediastinum are with normal limits and stable. No acute bone abnormality. IMPRESSION: No active disease. Electronically Signed   By: Scherrie Gerlach.D.  On: 05/08/2017 10:58   Dg Chest Port 1 View  Result Date: 05/06/2017 CLINICAL DATA:  Hypoxia EXAM: PORTABLE CHEST 1 VIEW COMPARISON:  April 27, 2017 FINDINGS:  The heart size and mediastinal contours are stable. The aorta is tortuous. Both lungs are clear. Degenerative joint changes of the shoulders are noted. IMPRESSION: No active cardiopulmonary disease. Electronically Signed   By: Abelardo Diesel M.D.   On: 05/06/2017 18:18    Madia Carvell, DO  [facility] Pager [phone]  If 7PM-7AM, please contact night-coverage [email or web address] Password [email or web address] 05/09/2017, 4:34 PM   LOS: 2 days

## 2017-05-10 ENCOUNTER — Inpatient Hospital Stay (HOSPITAL_COMMUNITY): Payer: Medicare Other

## 2017-05-10 DIAGNOSIS — Z7189 Other specified counseling: Secondary | ICD-10-CM

## 2017-05-10 LAB — ACTH STIMULATION, 3 TIME POINTS
CORTISOL 30 MIN: 23.8 ug/dL
Cortisol, 60 Min: 39.5 ug/dL
Cortisol, Base: 12.1 ug/dL

## 2017-05-10 MED ORDER — RESOURCE THICKENUP CLEAR PO POWD
ORAL | Status: DC | PRN
  Filled 2017-05-10 (×2): qty 125

## 2017-05-10 NOTE — Progress Notes (Signed)
PT HAS GONE TO RADIOLOGY FOR BARIUM SWALLOW

## 2017-05-10 NOTE — Progress Notes (Signed)
  Speech Language Pathology Treatment: Dysphagia  Patient Details Name: [name] MRN: [id number] DOB: [birth year] Today's Date: 05/10/2017 Time: 0355-9741 SLP Time Calculation (min) (ACUTE ONLY): 13 min  Assessment / Plan / Recommendation Clinical Impression  Dysphagia treatment provided for diet tolerance/ checking readiness for MBS. The pt is more alert today, responding to questions appropriately and following simple directions; also asking why he is in the hospital. Provided trials of nectar-thick liquids and puree consistency. Pt had a baseline wet vocal quality which continued throughout PO trials with a delayed throat clear x1. Pt's cognitive status and recent lateral medullary infarct put pt at a continued increased risk of aspiration. Given that pt's alertness level has increased, recommend proceeding with an MBS to objectively evaluate swallow function. Plan for MBS later this morning. Until then, continue current diet/ aspiration precautions (nectar-thick liquids/ puree).   HPI HPI: 83 year old male with history of remote prostate cancer, dementia, glaucoma, and recent admission 6/23-28 4 stroke who was discharged to a skilled nursing facility. The patient was transferred back to the hospital after he was found to be throwing up blood and developing hypothermia. In the emergency department, he had no further emesis. His initial temperature was 51F. He was placed on a bear hugger and started on vancomycin and Zosyn for possible sepsis. MRI demonstrates a possible subacute extension of a stroke seen during his previous hospitalization. His white blood cell count has been normal. Even though his chest x-ray was unremarkable, he has a coarse cough suggestive of pneumonia or aspiration. His urinalysis was unremarkable. Blood cultures were obtained and are pending. TSH was within normal limits and cortisol level has not yet been drawn. MRI shows: Subacute right lateral medullary infarct with  questionable increased extent compared to 04/28/2017 MRI. Subacute infarcts in the left cerebellum and bilateral cerebral cortex are fading. No new site of ischemia; no hemorrhagic conversion. BSE ordered due to suspicion of aspiration PNA. Pt passed RN swallow screen during both admissions (last week and yesterday). Pt reportedly tolerating diet well when alert.      SLP Plan  MBS       Recommendations  Diet recommendations: Dysphagia 1 (puree);Nectar-thick liquid Liquids provided via: Cup;Straw Medication Administration: Whole meds with puree Supervision: Staff to assist with self feeding;Full supervision/cueing for compensatory strategies Compensations: Slow rate;Small sips/bites Postural Changes and/or Swallow Maneuvers: Seated upright 90 degrees                Oral Care Recommendations: Oral care BID Follow up Recommendations: Skilled Nursing facility SLP Visit Diagnosis: Dysphagia, unspecified (R13.10) Plan: [place], [place], [place] 05/10/2017, 9:43 AM [id number]

## 2017-05-10 NOTE — Progress Notes (Addendum)
Modified Barium Swallow Progress Note  Patient Details  Name: Grayson Pfefferle MRN: [id number] Date of Birth: 12/14/1931  Today's Date: 05/10/2017  Modified Barium Swallow completed.  Full report located under Chart Review in the Imaging Section.  Brief recommendations include the following:  Clinical Impression  Pt currently with a moderate- severe oropharyngeal/ cervical esophageal phase dysphagia. Oral phase of swallow is disorganized with tongue pumping, piecemeal swallowing, delayed oral transit, and premature spill to the level of the pyriform sinuses. Pt also with reduced base of tongue retraction and occasional reduced airway closure resulting in silent aspiration of nectar-thick liquids- trace with teaspoon sip, frank with straw sips; pt able to clear most of aspirated material when cued to clear throat. Pt also with decreased UES opening and moderate-severe residuals in the pharynx; pt had hiccups during the study and there was backflow from the upper esophagus into the pharynx as well. Suspect that pt aspirated some of the residuals/ backflow as pt developed a wet vocal quality during the study; pt also expectorated significant amounts of what appeared to be barium mixed with saliva throughout the study. Pt appeared to have greater success with puree bolus, likely from the increased sensory input with a heavier material which resulted in fewer residuals and no aspiration of puree material was observed. The quality of the study was also limited by the pt's positioning (head leaning on wall, shoulder obstructing view at times). Aspiration risk is high at this time. If plan is to continue with PO intake, the safest would be puree and pudding-thick liquids, meds crushed in puree with oral care before and after meal. Would highly recommend for the study to be repeated as the pt's hiccups appeared to increase backflow from the esophagus; additionally pt seemed more lethargic compared to visit this  morning. Will continue to follow.    Swallow Evaluation Recommendations       SLP Diet Recommendations: NPO vs Dysphagia 1 (Puree) solids;Pudding thick liquid       Medication Administration: Crushed with puree   Supervision: Full assist for feeding;Full supervision/cueing for compensatory strategies   Compensations: Minimize environmental distractions;Slow rate;Small sips/bites   Postural Changes: Remain semi-upright after after feeds/meals (Comment);Seated upright at 90 degrees   Oral Care Recommendations: Oral care before and after [place], MA, CCC-SLP 05/10/2017,1:37 PM  [phone]

## 2017-05-10 NOTE — Progress Notes (Signed)
PROGRESS NOTE  [name] WLS:[id number] DOB: 04/25/1932 DOA: 05/06/2017 PCP: Dione Housekeeper, MD  Brief History: 83 year old male with history of remote prostate cancer, dementia, glaucoma, and recent admission 6/23-28 4 stroke who was discharged to a skilled nursing facility. The patient was transferred back to the hospital after he was found to be throwing up blood and developing hypothermia. In the emergency department, he had no further emesis. His initial temperature was 67F. He was placed on a bear hugger and started on vancomycin and Zosyn for possible sepsis. MRI demonstrates a possible subacute extension of a stroke seen during his previous hospitalization. His white blood cell count has been normal. Even though his chest x-ray was unremarkable, he has a coarse cough suggestive of pneumonia or aspiration. His urinalysis was unremarkable. Blood cultures were obtained and are pending. TSH was within normal limits and Cortrosyn stimulation test was normal.  Assessment/Plan: Hypothermia, likely due to underlying infection (suspect aspiration pneumonia)versus extension of stroke - Discontinue vancomycin as MRSA PCR is negative - blood cultures--neg to date -  UA--neg for pyuria -  PCT <0.10 - discontinue abx and monitor - 05/10/2017 Cortrosyn stimulation test normal - Continue Bear hugger as needed to keep temperature within normal limits - MRI demonstrated a possible extension of one of the strokes that was seen during his previous hospitalization - SLP evaluation--recommended modified barium swallow--> ultimately recommended dysphagia 1 diet with pudding thickened liquids however modified barium swallow may have been compromised by the patient's hiccups - Repeat CXR(after hydration)--neg - Has had any hypothermia for over 24 hours  Hematemesis - None observed since admission--hemoglobin remained stable throughout the hospitalization - BID PPI  Subacute  strokes with extension of recent stroke -05/07/2017 MRI brain subacute right lateral medullary infarct with increased extension, no new stroke. -Extension of stroke likely due to relative hypotension - PT/OT/SLP reevaluations-->SNF - Continue daily aspirin - LDL was 66, A1c 5.4, ECHO with preserved EF of 60-65%, carotid duplex with 1-39% stenosis a week ago - Per Neurology, due to amyloid angiopathy and dementia, patient is not a good candidate for anticoagulation and they did not pursue further work up during his previous hospitalization -Cased was discussed with neurology, Dr. Sherin Quarry need for transfer or further workup - F/u with Dr. Leonie Man as outpatient  Bradycardia, chronic,  - may be side effect ofAricept - May be exacerbated by hypothermia -No AV blocks or pauses on telemetry--personally reviewed tele  Dementia/Goals of Care -confusion worse after recent CVAs, although patient had apparently progressive disease prior -DNR status confirmed with family -palliative care meeting 7/6-->plan to d/c to SNF with palliative services -case discussed with palliative medicine team    Disposition Plan: SNF with palliative services on 7/7 or 7/8 Family Communication: Family updated at bedside 05/10/17  Consultants:  palliative medicine  Code Status: DNR, palliative medicine  DVT Prophylaxis: [place] Lovenox   Procedures: As Listed in Progress Note Above  Antibiotics: Zosyn 7/2>>>7/6 vanco 7/2>>>7/3   Subjective: Patient is more alert but remains pleasantly confused. He is able to answer simple questions. Denies any chest pain, short of breath, abdominal pain, vomiting, diarrhea. He is eating better today.  Objective: Vitals:   05/10/17 1224 05/10/17 1330 05/10/17 1400 05/10/17 1528  BP:  133/78 130/77   Pulse:  63 63   Resp:  (!) 7 10   Temp: (!) 97.4 F (36.3 C)   98.7 F (37.1 C)  TempSrc: Axillary   Axillary  SpO2:  97% 99%   Weight:        Height:        Intake/Output Summary (Last 24 hours) at 05/10/17 1719 Last data filed at 05/10/17 1400  Gross per 24 hour  Intake             2200 ml  Output              450 ml  Net             1750 ml   Weight change: -0.6 kg (-1 lb 5.2 oz) Exam:   General:  Pt is alert, follows commands appropriately, not in acute distress  HEENT: No icterus, No thrush, No neck mass, [place]/AT  Cardiovascular: RRR, S1/S2, no rubs, no gallops  Respiratory: Bibasilar rales. Good air movement. No wheezing.  Abdomen: Soft/+BS, non tender, non distended, no guarding  Extremities: No edema, No lymphangitis, No petechiae, No rashes, no synovitis   Data Reviewed: I have personally reviewed following labs and imaging studies Basic Metabolic Panel:  Recent Labs Lab 05/06/17 1756 05/07/17 0300 05/08/17 0555 05/09/17 0442  NA 137 139 138 138  K 3.7 3.7 3.9 3.9  CL 104 106 102 104  CO2 27 26 27 26   GLUCOSE 112* 89 93 90  BUN 19 14 13 12   CREATININE 0.75 0.88 1.08 1.06  CALCIUM 9.1 8.7* 8.9 8.9  MG  --   --  1.8  --    Liver Function Tests:  Recent Labs Lab 05/06/17 1756  AST 44*  ALT 64*  ALKPHOS 82  BILITOT 0.6  PROT 6.6  ALBUMIN 3.3*    Recent Labs Lab 05/06/17 1756  LIPASE 25   No results for input(s): AMMONIA in the last 168 hours. Coagulation Profile:  Recent Labs Lab 05/06/17 2117  INR 0.91   CBC:  Recent Labs Lab 05/06/17 1756 05/07/17 0300 05/09/17 0531  WBC 6.4 5.3 8.9  NEUTROABS 5.0  --   --   HGB 12.9* 11.8* 12.4*  HCT 37.6* 34.7* 37.6*  MCV 91.5 91.6 93.3  PLT 269 243 251   Cardiac Enzymes: No results for input(s): CKTOTAL, CKMB, CKMBINDEX, TROPONINI in the last 168 hours. BNP: Invalid input(s): POCBNP CBG: No results for input(s): GLUCAP in the last 168 hours. HbA1C: No results for input(s): HGBA1C in the last 72 hours. Urine analysis:    Component Value Date/Time   COLORURINE STRAW (A) 05/06/2017 1750   APPEARANCEUR CLEAR 05/06/2017  1750   LABSPEC 1.012 05/06/2017 1750   PHURINE 6.0 05/06/2017 1750   GLUCOSEU NEGATIVE 05/06/2017 1750   HGBUR NEGATIVE 05/06/2017 1750   BILIRUBINUR NEGATIVE 05/06/2017 1750   KETONESUR NEGATIVE 05/06/2017 1750   PROTEINUR NEGATIVE 05/06/2017 1750   UROBILINOGEN 1.0 12/22/2007 1456   NITRITE NEGATIVE 05/06/2017 1750   LEUKOCYTESUR NEGATIVE 05/06/2017 1750   Sepsis Labs: @LABRCNTIP (procalcitonin:4,lacticidven:4) ) Recent Results (from the past 240 hour(s))  Blood Culture (routine x 2)     Status: None (Preliminary result)   Collection Time: 05/06/17  5:56 PM  Result Value Ref Range Status   Specimen Description BLOOD RIGHT HAND  Final   Special Requests   Final    BOTTLES DRAWN AEROBIC AND ANAEROBIC Blood Culture adequate volume   Culture NO GROWTH 4 DAYS  Final   Report Status PENDING  Incomplete  Blood Culture (routine x 2)     Status: None (Preliminary result)   Collection Time: 05/06/17  6:00 PM  Result Value Ref Range Status  Specimen Description BLOOD LEFT ARM  Final   Special Requests   Final    BOTTLES DRAWN AEROBIC AND ANAEROBIC Blood Culture adequate volume   Culture NO GROWTH 4 DAYS  Final   Report Status PENDING  Incomplete  MRSA PCR Screening     Status: None   Collection Time: 05/06/17 11:10 PM  Result Value Ref Range Status   MRSA by PCR NEGATIVE NEGATIVE Final    Comment:        The GeneXpert MRSA Assay (FDA approved for NASAL specimens only), is one component of a comprehensive MRSA colonization surveillance program. It is not intended to diagnose MRSA infection nor to guide or monitor treatment for MRSA infections.      Scheduled Meds: . aspirin  325 mg Oral Daily  . chlorhexidine  15 mL Mouth Rinse BID  . docusate sodium  100 mg Oral BID  . donepezil  10 mg Oral QHS  . enoxaparin (LOVENOX) injection  40 mg Subcutaneous Q24H  . latanoprost  1 drop Both Eyes QHS  . mouth rinse  15 mL Mouth Rinse q12n4p  . memantine  10 mg Oral BID  .  pantoprazole  40 mg Oral BID AC   Continuous Infusions: . sodium chloride 1,000 mL (05/10/17 1400)    Procedures/Studies: Ct Angio Head W Or Wo Contrast  Result Date: 04/30/2017 CLINICAL DATA:  Acute encephalopathy and syncopal episode. Multiple small acute brainstem and posterior fossa infarcts. EXAM: CT ANGIOGRAPHY HEAD AND NECK TECHNIQUE: Multidetector CT imaging of the head and neck was performed using the standard protocol during bolus administration of intravenous contrast. Multiplanar CT image reconstructions and MIPs were obtained to evaluate the vascular anatomy. Carotid stenosis measurements (when applicable) are obtained utilizing NASCET criteria, using the distal internal carotid diameter as the denominator. CONTRAST:  50 mL Isovue 370 COMPARISON:  Brain MRI 04/28/2017 FINDINGS: CT HEAD FINDINGS Brain: No hemorrhage or mass effect. There is faint hypoattenuation at the sites of diffusion restriction described on recent MRI. There is periventricular hypoattenuation compatible with chronic microvascular disease. Vascular: No hyperdense vessel or unexpected calcification. Skull: Normal visualized skull base, calvarium and extracranial soft tissues. Sinuses/Orbits: No sinus fluid levels or advanced mucosal thickening. No mastoid effusion. Normal orbits. CTA NECK FINDINGS Aortic arch: There is no aneurysm or dissection of the visualized ascending aorta or aortic arch. There is a normal 3 vessel branching pattern. The visualized proximal subclavian arteries are normal. Right carotid system: The right common carotid origin is widely patent. There is no common carotid or internal carotid artery dissection or aneurysm. No hemodynamically significant stenosis. Left carotid system: The left common carotid origin is widely patent. There is no common carotid or internal carotid artery dissection or aneurysm. No hemodynamically significant stenosis. Vertebral arteries: The vertebral system is left dominant.  There is calcification in the left P1 segment. The right vertebral artery is diminutive along its entire course. There is no flow demonstrated in the right V4 segment. Skeleton: There is no bony spinal canal stenosis. No lytic or blastic lesions. Other neck: The nasopharynx is clear. The oropharynx and hypopharynx are normal. The epiglottis is normal. The supraglottic larynx, glottis and subglottic larynx are normal. No retropharyngeal collection. The parapharyngeal spaces are preserved. The parotid and submandibular glands are normal. No sialolithiasis or salivary ductal dilatation. The thyroid gland is normal. There is no cervical lymphadenopathy. Upper chest: There is ground-glass opacity in the posterior right upper lobe. Small bilateral pleural effusions with associated atelectasis. Review of the  MIP images confirms the above findings CTA HEAD FINDINGS Anterior circulation: --Intracranial internal carotid arteries: Normal. --Anterior cerebral arteries: Normal. --Middle cerebral arteries: Normal. --Posterior communicating arteries: Present on the right. Posterior circulation: --Posterior cerebral arteries: Normal. --Superior cerebellar arteries: Normal. --Basilar artery: Normal. --Anterior inferior cerebellar arteries: Normal. --Posterior inferior cerebellar arteries: Left dominant. Venous sinuses: As permitted by contrast timing, patent. Anatomic variants: None Delayed phase: No parenchymal contrast enhancement. Review of the MIP images confirms the above findings IMPRESSION: 1. No emergent large vessel occlusion. 2. No hemodynamically significant stenosis of the major cervical arteries. 3. Congenitally diminutive right vertebral artery. Electronically Signed   By: Ulyses Jarred M.D.   On: 04/30/2017 21:22   Dg Chest 1 View  Result Date: 04/27/2017 CLINICAL DATA:  Found unresponsive. EXAM: CHEST 1 VIEW COMPARISON:  Chest x-ray dated 12/25/2007. FINDINGS: Heart size is upper normal. Overall cardiomediastinal  silhouette is stable in size and configuration. Lungs are clear. No pleural effusion or pneumothorax seen. Osseous and soft tissue structures about the chest are unremarkable. IMPRESSION: No active disease. Electronically Signed   By: Franki Cabot M.D.   On: 04/27/2017 20:50   Ct Head Wo Contrast  Result Date: 05/06/2017 CLINICAL DATA:  Hypothermia.  Altered mental status. EXAM: CT HEAD WITHOUT CONTRAST TECHNIQUE: Contiguous axial images were obtained from the base of the skull through the vertex without intravenous contrast. COMPARISON:  Head CT 04/30/2017, 04/27/2017.  Brain MRI 04/28/2017 FINDINGS: Brain: No acute intracranial hemorrhage. Small acute infarcts on prior MRI are not well seen by CT. Stable degree of atrophy and chronic small vessel ischemia. Remote lacunar infarcts in the right thalamus. No extra-axial fluid collection. Vascular: Atherosclerosis of skullbase vasculature without hyperdense vessel or abnormal calcification. Skull: No fracture or focal lesion. Sinuses/Orbits: Paranasal sinuses and mastoid air cells are clear. The visualized orbits are unremarkable. Bilateral cataract resection. Other: None. IMPRESSION: No acute abnormality. Stable degree of atrophy and chronic small vessel ischemia. Recent small infarcts on MRI are not well seen by CT. Electronically Signed   By: Jeb Levering M.D.   On: 05/06/2017 20:36   Ct Head Wo Contrast  Result Date: 04/27/2017 CLINICAL DATA:  Found unresponsive earlier today. Low blood pressure. Partial resuscitation. EXAM: CT HEAD WITHOUT CONTRAST TECHNIQUE: Contiguous axial images were obtained from the base of the skull through the vertex without intravenous contrast. COMPARISON:  03/30/2015 brain MR. FINDINGS: Brain: No evidence for acute stroke, acute hemorrhage, mass lesion, or extra-axial fluid. There is extreme atrophy, with hydrocephalus ex vacuo. Extensive involvement of the white matter by hypoattenuation, representing chronic microvascular  ischemic change. There is an old RIGHT thalamus lacunar infarct. Vascular: No hyperdense vessel or unexpected calcification. Skull: Normal. Negative for fracture or focal lesion. Sinuses/Orbits: No acute findings or layering fluid. BILATERAL cataract extraction. Other: None.  Compared with prior brain MR, similar appearance. IMPRESSION: Severe atrophy and extensive white matter disease. Evidence for chronic cerebral infarction. No acute intracranial findings are evident. Electronically Signed   By: Staci Righter M.D.   On: 04/27/2017 20:22   Ct Angio Neck W Or Wo Contrast  Result Date: 04/30/2017 CLINICAL DATA:  Acute encephalopathy and syncopal episode. Multiple small acute brainstem and posterior fossa infarcts. EXAM: CT ANGIOGRAPHY HEAD AND NECK TECHNIQUE: Multidetector CT imaging of the head and neck was performed using the standard protocol during bolus administration of intravenous contrast. Multiplanar CT image reconstructions and MIPs were obtained to evaluate the vascular anatomy. Carotid stenosis measurements (when applicable) are obtained utilizing NASCET criteria,  using the distal internal carotid diameter as the denominator. CONTRAST:  50 mL Isovue 370 COMPARISON:  Brain MRI 04/28/2017 FINDINGS: CT HEAD FINDINGS Brain: No hemorrhage or mass effect. There is faint hypoattenuation at the sites of diffusion restriction described on recent MRI. There is periventricular hypoattenuation compatible with chronic microvascular disease. Vascular: No hyperdense vessel or unexpected calcification. Skull: Normal visualized skull base, calvarium and extracranial soft tissues. Sinuses/Orbits: No sinus fluid levels or advanced mucosal thickening. No mastoid effusion. Normal orbits. CTA NECK FINDINGS Aortic arch: There is no aneurysm or dissection of the visualized ascending aorta or aortic arch. There is a normal 3 vessel branching pattern. The visualized proximal subclavian arteries are normal. Right carotid  system: The right common carotid origin is widely patent. There is no common carotid or internal carotid artery dissection or aneurysm. No hemodynamically significant stenosis. Left carotid system: The left common carotid origin is widely patent. There is no common carotid or internal carotid artery dissection or aneurysm. No hemodynamically significant stenosis. Vertebral arteries: The vertebral system is left dominant. There is calcification in the left P1 segment. The right vertebral artery is diminutive along its entire course. There is no flow demonstrated in the right V4 segment. Skeleton: There is no bony spinal canal stenosis. No lytic or blastic lesions. Other neck: The nasopharynx is clear. The oropharynx and hypopharynx are normal. The epiglottis is normal. The supraglottic larynx, glottis and subglottic larynx are normal. No retropharyngeal collection. The parapharyngeal spaces are preserved. The parotid and submandibular glands are normal. No sialolithiasis or salivary ductal dilatation. The thyroid gland is normal. There is no cervical lymphadenopathy. Upper chest: There is ground-glass opacity in the posterior right upper lobe. Small bilateral pleural effusions with associated atelectasis. Review of the MIP images confirms the above findings CTA HEAD FINDINGS Anterior circulation: --Intracranial internal carotid arteries: Normal. --Anterior cerebral arteries: Normal. --Middle cerebral arteries: Normal. --Posterior communicating arteries: Present on the right. Posterior circulation: --Posterior cerebral arteries: Normal. --Superior cerebellar arteries: Normal. --Basilar artery: Normal. --Anterior inferior cerebellar arteries: Normal. --Posterior inferior cerebellar arteries: Left dominant. Venous sinuses: As permitted by contrast timing, patent. Anatomic variants: None Delayed phase: No parenchymal contrast enhancement. Review of the MIP images confirms the above findings IMPRESSION: 1. No emergent  large vessel occlusion. 2. No hemodynamically significant stenosis of the major cervical arteries. 3. Congenitally diminutive right vertebral artery. Electronically Signed   By: Ulyses Jarred M.D.   On: 04/30/2017 21:22   Mr Jodene Nam Head Wo Contrast  Result Date: 05/07/2017 CLINICAL DATA:  Hypothermia. Altered mental status. History of dementia. EXAM: MRI HEAD WITHOUT CONTRAST MRA HEAD WITHOUT CONTRAST TECHNIQUE: Multiplanar, multiecho pulse sequences of the brain and surrounding structures were obtained without intravenous contrast. Angiographic images of the head were obtained using MRA technique without contrast. COMPARISON:  04/28/2017 FINDINGS: MRI HEAD FINDINGS Brain: Subacute right lateral medullary infarct appears more extensive than on prior. A subacute left cerebellar infarct on the previous study has decreased in restriction intensity. Cerebral cortical infarcts seen previously are no longer visualized. There are numerous small remote bilateral cerebellar infarcts. Remote lacunar infarct in the right thalamus. Atrophy with ventriculomegaly, especially of the temporal and occipital lobes. There is chronic confluent FLAIR hyperintensity in the cerebral white matter with remote lacunes in the bilateral centrum semiovale. The corpus callosum is thinned the setting of cerebral volume loss. Numerous foci of chronic lobar microhemorrhage. Vascular: Arterial findings below. Normal dural venous sinus flow voids. Skull and upper cervical spine: Negative for marrow lesion. Sinuses/Orbits:  No acute finding.  Bilateral cataract resection. MRA HEAD FINDINGS Solitary visible left vertebral artery, stable from CTA 04/28/2017. The right vertebral artery is hypoplastic and may be occluded at the V4 segment. Mild proximal basilar atheromatous type narrowing. The right P1 segment is hypoplastic. Right P2 segment is attenuated and appears diffusely irregular, apparently new from prior but potentially artifactual on this motion  degraded study. No reversible flow limiting stenosis in the anterior circulation. There is moderate irregularity of bilateral MCA branches, again potentially related to artifact rather than atherosclerosis. IMPRESSION: 1. Subacute right lateral medullary infarct with questionable increased extent compared to 04/28/2017 MRI. Subacute infarcts in the left cerebellum and bilateral cerebral cortex are fading. No new site of ischemia; no hemorrhagic conversion. 2. Motion degraded MRA which may account for apparent newly narrowed bilateral M2 and right P2 segments when compared to CTA 04/30/2017. 3. Advanced atrophy and chronic white matter disease. Remote lobar micro hemorrhages may reflect underlying amyloid angiography. Electronically Signed   By: Monte Fantasia M.D.   On: 05/07/2017 10:31   Mr Brain Wo Contrast  Result Date: 05/07/2017 CLINICAL DATA:  Hypothermia. Altered mental status. History of dementia. EXAM: MRI HEAD WITHOUT CONTRAST MRA HEAD WITHOUT CONTRAST TECHNIQUE: Multiplanar, multiecho pulse sequences of the brain and surrounding structures were obtained without intravenous contrast. Angiographic images of the head were obtained using MRA technique without contrast. COMPARISON:  04/28/2017 FINDINGS: MRI HEAD FINDINGS Brain: Subacute right lateral medullary infarct appears more extensive than on prior. A subacute left cerebellar infarct on the previous study has decreased in restriction intensity. Cerebral cortical infarcts seen previously are no longer visualized. There are numerous small remote bilateral cerebellar infarcts. Remote lacunar infarct in the right thalamus. Atrophy with ventriculomegaly, especially of the temporal and occipital lobes. There is chronic confluent FLAIR hyperintensity in the cerebral white matter with remote lacunes in the bilateral centrum semiovale. The corpus callosum is thinned the setting of cerebral volume loss. Numerous foci of chronic lobar microhemorrhage. Vascular:  Arterial findings below. Normal dural venous sinus flow voids. Skull and upper cervical spine: Negative for marrow lesion. Sinuses/Orbits: No acute finding.  Bilateral cataract resection. MRA HEAD FINDINGS Solitary visible left vertebral artery, stable from CTA 04/28/2017. The right vertebral artery is hypoplastic and may be occluded at the V4 segment. Mild proximal basilar atheromatous type narrowing. The right P1 segment is hypoplastic. Right P2 segment is attenuated and appears diffusely irregular, apparently new from prior but potentially artifactual on this motion degraded study. No reversible flow limiting stenosis in the anterior circulation. There is moderate irregularity of bilateral MCA branches, again potentially related to artifact rather than atherosclerosis. IMPRESSION: 1. Subacute right lateral medullary infarct with questionable increased extent compared to 04/28/2017 MRI. Subacute infarcts in the left cerebellum and bilateral cerebral cortex are fading. No new site of ischemia; no hemorrhagic conversion. 2. Motion degraded MRA which may account for apparent newly narrowed bilateral M2 and right P2 segments when compared to CTA 04/30/2017. 3. Advanced atrophy and chronic white matter disease. Remote lobar micro hemorrhages may reflect underlying amyloid angiography. Electronically Signed   By: Monte Fantasia M.D.   On: 05/07/2017 10:31   Mr Brain Wo Contrast  Result Date: 04/28/2017 CLINICAL DATA:  Found unresponsive. Syncope and collapse. Prior stroke. EXAM: MRI HEAD WITHOUT CONTRAST TECHNIQUE: Multiplanar, multiecho pulse sequences of the brain and surrounding structures were obtained without intravenous contrast. COMPARISON:  Head CT 04/27/2017 and MRI 03/30/2015 FINDINGS: Brain: Small acute infarcts are present in the right medulla  and posteroinferior left cerebellar hemisphere. There are also punctate acute cortical infarcts in the high posterior frontal lobes bilaterally. Scattered chronic  cerebral and cerebellar microhemorrhages are again seen. A small chronic right occipital cortical infarct is new or larger than on the prior MRI. There also may be a tiny left occipital chronic cortical infarct. Patchy to confluent cerebral white matter T2 hyperintensities are similar to the prior MRI and nonspecific but compatible with extensive chronic small vessel ischemic disease. There are numerous small chronic infarcts in the cerebellum bilaterally. Chronic lacunar infarcts are also again seen in the anterior left centrum semiovale and right thalamus. There is marked cerebral atrophy. No mass, midline shift, or extra-axial fluid collection is seen. Vascular: Unchanged poor visualization of the distal right vertebral artery, possibly occluded. Dominant appearing left vertebral artery with grossly preserved vascular flow voids elsewhere. Skull and upper cervical spine: Unremarkable bone marrow signal. Sinuses/Orbits: Bilateral cataract extraction. Paranasal sinuses and mastoid air cells are clear. Other: None. IMPRESSION: 1. 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 2. Advanced cerebral atrophy and chronic small vessel ischemic disease with chronic infarcts as above. Electronically Signed   By: Logan Bores M.D.   On: 04/28/2017 12:13   US Carotid Bilateral (at [facility] Only)  Result Date: 04/29/2017 CLINICAL DATA:  Stroke.  Syncopal episode. EXAM: BILATERAL CAROTID DUPLEX ULTRASOUND TECHNIQUE: Pearline Cables scale imaging, color Doppler and duplex ultrasound were performed of bilateral carotid and vertebral arteries in the neck. COMPARISON:  None. FINDINGS: Criteria: Quantification of carotid stenosis is based on velocity parameters that correlate the residual internal carotid diameter with NASCET-based stenosis levels, using the diameter of the distal internal carotid lumen as the denominator for stenosis measurement. The following velocity measurements were obtained:  RIGHT ICA:  45/6 cm/sec CCA:  161/09 cm/sec SYSTOLIC ICA/CCA RATIO:  0.4 DIASTOLIC ICA/CCA RATIO:  0.4 ECA:  105 cm/sec LEFT ICA:  78/6 cm/sec CCA:  604/54 cm/sec SYSTOLIC ICA/CCA RATIO:  0.7 DIASTOLIC ICA/CCA RATIO:  0.6 ECA:  102 cm/sec RIGHT CAROTID ARTERY: There is a very minimal amount of intimal thickening within the right carotid bulb (image 17)), extending to involve the origin and proximal aspects of the right internal carotid artery (image 25), not resulting in elevated peak systolic velocities within the interrogated course the right internal carotid artery to suggest a hemodynamically significant stenosis. RIGHT VERTEBRAL ARTERY:  Antegrade flow LEFT CAROTID ARTERY: There is a moderate amount of eccentric mixed echogenic plaque within the left carotid bulb (images 49 and 51), not resulting in elevated peak systolic velocities within the interrogated course of the left internal carotid artery to suggest a hemodynamically significant stenosis. LEFT VERTEBRAL ARTERY:  Antegrade flow IMPRESSION: 1. Moderate amount of left-sided atherosclerotic plaque, not resulting in a hemodynamically significant stenosis. 2. Minimal amount of right-sided intimal wall thickening, not resulting in a hemodynamically significant stenosis. Electronically Signed   By: Sandi Mariscal M.D.   On: 04/29/2017 11:56   Dg Chest Port 1 View  Result Date: 05/08/2017 CLINICAL DATA:  Rhonchi.  Congestion. EXAM: PORTABLE CHEST 1 VIEW COMPARISON:  05/06/2017 FINDINGS: Upright AP view of the chest was obtained. There is no focal airspace disease or pulmonary edema. There is a skin fold overlying the lateral right chest. Negative for a pneumothorax. Heart and mediastinum are with normal limits and stable. No acute bone abnormality. IMPRESSION: No active disease. Electronically Signed   By: Markus Daft M.D.   On: 05/08/2017 10:58   Dg  Chest Port 1 View  Result Date: 05/06/2017 CLINICAL DATA:  Hypoxia EXAM: PORTABLE CHEST 1 VIEW COMPARISON:   April 27, 2017 FINDINGS: The heart size and mediastinal contours are stable. The aorta is tortuous. Both lungs are clear. Degenerative joint changes of the shoulders are noted. IMPRESSION: No active cardiopulmonary disease. Electronically Signed   By: Abelardo Diesel M.D.   On: 05/06/2017 18:18    Aaden Buckman, DO  [facility] Pager [phone]  If 7PM-7AM, please contact night-coverage [email or web address] Password [email or web address] 05/10/2017, 5:19 PM   LOS: 3 days

## 2017-05-10 NOTE — Progress Notes (Signed)
Physical Therapy Treatment Patient Details Name: [name] MRN: [id number] DOB: [birth year] Today's Date: 05/10/2017    History of Present Illness [name] is a 83 y.o. male with medical history significant of remote prostate CA; dementia; glaucoma; and recent admission (6/23-28) for CVAs who was discharged to SNF.  [place] called the family this afternoon, they were concerned that temperature dropped, BP was low, and he was throwing up blood.  Brother was there about 3pm and he was doing fine.  Facility called him about 430.  They did not specifiy how low BP was or how low temperature.  They also reported throwing up blood but did not quantify (Dr. Rogene Houston reports only 1 episode).  No emesis in the ER.    PT Comments    Pt is able to be assisted to chair with PT and nursing in the room to be available.  Pt cannot take a step but allows PT to pivot him to chair.  He will be appropriate for SNF care to work on increasing mobility, but has some significant L side neglect to counter for success.  He is willing to let PT help him and so expect him to continue to progress acutely and in rehab setting.   Follow Up Recommendations  SNF     Equipment Recommendations  None recommended by PT    Recommendations for Other Services OT consult     Precautions / Restrictions Precautions Precautions: Fall (telemetry) Precaution Comments: R side weakness Restrictions Weight Bearing Restrictions: No    Mobility  Bed Mobility Overal bed mobility: Needs Assistance Bed Mobility: Supine to Sit;Rolling Rolling: Min assist;Mod assist Sidelying to sit: Mod assist;Max assist Supine to sit: Mod assist;Max assist     General bed mobility comments: cued his use of bed rails and sequencing  Transfers Overall transfer level: Needs assistance Equipment used: 1 person hand held assist (2 would have been better ) Transfers: Sit to/from [facility] Sit to Stand: Max  assist Stand pivot transfers: Max assist;Total assist       General transfer comment: pt was compliant with PT instructiions  Ambulation/Gait             General Gait Details: unable to step   Stairs            Wheelchair Mobility    Modified Rankin (Stroke Patients Only)       Balance Overall balance assessment: Needs assistance Sitting-balance support: Feet supported;Bilateral upper extremity supported Sitting balance-Leahy Scale: Fair Sitting balance - Comments: listing to R but can maintain with dense cues     Standing balance-Leahy Scale: Poor                              Cognition Arousal/Alertness: Lethargic Behavior During Therapy: Flat affect Overall Cognitive Status: Impaired/Different from baseline Area of Impairment: Attention;Following commands;Safety/judgement;Awareness;Problem solving                 Orientation Level: Situation Current Attention Level: Sustained Memory: Decreased short-term memory Following Commands: Follows one step commands with increased time Safety/Judgement: Decreased awareness of safety;Decreased awareness of deficits Awareness: Intellectual Problem Solving: Slow processing;Decreased initiation;Difficulty sequencing;Requires verbal cues;Requires tactile cues        Exercises      General Comments        Pertinent Vitals/Pain Pain Assessment: Faces Pain Score: 0-No pain Faces Pain Scale: No hurt    Home Living  Prior Function            PT Goals (current goals can now be found in the care plan section) Acute Rehab PT Goals Patient Stated Goal: none stated Progress towards PT goals: Progressing toward goals    Frequency    Min 5X/week      PT Plan Current plan remains appropriate    Co-evaluation              AM-PAC PT "6 Clicks" Daily Activity  Outcome Measure  Difficulty turning over in bed (including adjusting bedclothes, sheets  and blankets)?: A Lot Difficulty moving from lying on back to sitting on the side of the bed? : A Lot Difficulty sitting down on and standing up from a chair with arms (e.g., wheelchair, bedside commode, etc,.)?: A Lot Help needed moving to and from a bed to chair (including a wheelchair)?: A Lot Help needed walking in hospital room?: Total Help needed climbing 3-5 steps with a railing? : Total 6 Click Score: 10    End of Session Equipment Utilized During Treatment: Gait belt Activity Tolerance: Patient tolerated treatment well Patient left: in chair;with call bell/phone within reach;with chair alarm set;with nursing/sitter in room Nurse Communication: Mobility status;Need for lift equipment PT Visit Diagnosis: Muscle weakness (generalized) (M62.81);Difficulty in walking, not elsewhere classified (R26.2);Other abnormalities of gait and mobility (R26.89)     Time: 9381-8299 PT Time Calculation (min) (ACUTE ONLY): 37 min  Charges:  $Therapeutic Activity: 23-37 mins                    G Codes:  Functional Assessment Tool Used: AM-PAC 6 Clicks Basic Mobility     Ramond Dial 05/10/2017, 2:12 PM   Mee Hives, PT MS Acute Rehab Dept. Number: [place] and [place]

## 2017-05-10 NOTE — Progress Notes (Signed)
Daily Progress Note   Patient Name: [name]       Date: 05/10/2017 DOB: Oct 21, 1932  Age: 83 y.o. MRN#: [id number] Attending Physician: Orson Eva, MD Primary Care Physician: Dione Housekeeper, MD Admit Date: 05/06/2017  Reason for Consultation/Follow-up: Establishing goals of care and Psychosocial/spiritual support  Subjective: Mr. [name] is sitting in the Palmer chair in his room. He is able to make, but does not keep eye contact. He has no complaints of pain at this time, but states he wants to go to his own home. I share that his family and I will have a meeting, trying to make a good plan for him.  Family and I go to my office for a meeting. Present today is brother/legal guardian [name] and his wife [name], brother [name], sons [name], and [name] and his wife [name], and [name] and Polly's daughter [name].  We talk about [name] acute and chronic health problems. We also talk about [name] functional status. He had an in-home caregiver 5 hours per day. I ask what the family understands about his health conditions, what the doctors have told them. Family states that [name] was a vibrant, active man prior to this stroke and quality of life is important to him. We talk about rehab and the possibility of some return of functional status. We talk about speech therapy consult, and recommended diets. We talk about the risk for dehydration with the thickened liquids. We talk about returning to the hospital for dehydration, getting IV fluids but returning to the same situation and getting dehydrated again.  I share a diagram of the chronic illness pathway, what is normal and expected. We talk about the concept of allowing natural death. I share the MOST form, but we do not completed at this time.  Family would NOT want a PEG tube. I also give a copy of the booklet "Hard choices for loving people".  One family member asks about discharge to [facility], what if his body temperature lowers again. I share that [name], as legal guardian, decides whether [name] would return to the hospital or not. I share that, at this point, we don't know what is causing the lowered body temperature, and therefore cannot prevent it. I provide an example of when "letting nature take its course", is appropriate.  Family asks about rehab, when should they see results. We talk about waxing and waning days but, they should see improvements every 5 to 7 days if [name] is able to improve. We also talk about the difficulties for people with dementia when they are transitioned to new places. Brother [name] questions of rehab is the choice. I share that if family, at any point, wants to decide home with hospice, the medical team would support that decision also.  Family will gather together to discuss options, the best choices for [name].  Length of Stay: 3  Current Medications: Scheduled Meds:  . aspirin  325 mg Oral Daily  . chlorhexidine  15 mL Mouth Rinse BID  . docusate sodium  100 mg Oral BID  . donepezil  10 mg Oral QHS  . enoxaparin (LOVENOX) injection  40 mg Subcutaneous Q24H  . latanoprost  1 drop Both Eyes QHS  . mouth rinse  15 mL Mouth Rinse q12n4p  . memantine  10 mg Oral BID  . pantoprazole  40 mg Oral BID AC    Continuous Infusions: . sodium chloride 1,000 mL (05/10/17 1100)    PRN Meds: acetaminophen **OR** acetaminophen, haloperidol lactate, ondansetron **OR** ondansetron (ZOFRAN) IV  Physical Exam  Constitutional: No distress.  HENT:  Head: Normocephalic and atraumatic.  Cardiovascular: Normal rate.   Pulmonary/Chest: Effort normal. No respiratory distress.  Abdominal: Soft. He exhibits no distension.  Musculoskeletal: He exhibits no edema.  Neurological: He is alert.  Skin:  Skin is warm and dry.  Nursing note and vitals reviewed.           Vital Signs: BP (!) 156/81   Pulse (!) 59   Temp (!) 97.4 F (36.3 C) (Axillary)   Resp (!) 9   Ht 5\' 10"  (1.778 m)   Wt 64.5 kg (142 lb 3.2 oz)   SpO2 100%   BMI 20.40 kg/m  SpO2: SpO2: 100 % O2 Device: O2 Device: Not Delivered O2 Flow Rate:    Intake/output summary:  Intake/Output Summary (Last 24 hours) at 05/10/17 1308 Last data filed at 05/10/17 1100  Gross per 24 hour  Intake             2310 ml  Output              900 ml  Net             1410 ml   LBM: Last BM Date:  (unknown) Baseline Weight: Weight: 65.8 kg (145 lb) Most recent weight: Weight: 64.5 kg (142 lb 3.2 oz)       Palliative Assessment/Data:    Flowsheet Rows     Most Recent Value  Intake Tab  Referral Department  Hospitalist  Unit at Time of Referral  ICU  Palliative Care Primary Diagnosis  Cancer  Date Notified  05/09/17  Palliative Care Type  New Palliative care  Reason for referral  Clarify Goals of Care  Date of Admission  05/06/17  Date first seen by Palliative Care  05/09/17  # of days Palliative referral response time  0 Day(s)  # of days IP prior to Palliative referral  3  Clinical Assessment  Palliative Performance Scale Score  20%  Pain Max last 24 hours  Not able to report  Pain Min Last 24 hours  Not able to report  Dyspnea Max Last 24 Hours  Not able to report  Dyspnea Min Last 24 hours  Not able to report  Psychosocial &  Spiritual Assessment  Palliative Care Outcomes      Patient Active Problem List   Diagnosis Date Noted  . Palliative care encounter   . Goals of care, counseling/discussion   . Aspiration pneumonitis ([place])   . Hypothermia 05/06/2017  . Episode of unresponsiveness   . CVA (cerebral vascular accident) ([place]) 04/30/2017  . UTI (urinary tract infection) 04/28/2017  . Renal failure syndrome   . Syncope and collapse 04/27/2017  . Dehydration 04/27/2017  . Dementia 04/27/2017  .  Rhabdomyolysis 04/27/2017  . Femur fracture, left ([place]) 07/03/2014  . Bradycardia 07/03/2014  . Abdominal pain, other specified site 10/14/2013  . Nausea & vomiting 10/14/2013    Palliative Care Assessment & Plan   Patient Profile: 83 y.o. male  with past medical history of Vascular dementia for or 2+ years, history of prostate cancer with seed implant 2004, history of recent stroke 04/2017, history of left femur fracture August 2015 admitted on 05/06/2017 with hyperthermia of uncertain etiology.   Assessment: Hypothermia; related to underlying infection versus adrenal insufficiency versus extension of the stroke. Mr. [name] has had a workup for infection including urinalysis, blood cultures, chest x-ray. He recently had cortisol testing. Possibly his hypothermia is related to stroke. recent stroke; Mr. [name] had been living in his own home with a 5 hour day caregiver until a recent stroke. He has had dysphasia and mobility problems since. He was transferred to [place] home for rehab, but found to have low body temperature, returned to [facility] hospital for evaluation.  Recommendations/Plan:  at this point, family is agreeable to return to [facility] SNF/rehab for return of as much function as possible. We talk about returning to home with the benefit of hospice after completion of rehab versus becoming resident of SNF rehab. Family states that [name] would not want to live the rest of his life in a nursing home.  Goals of Care and Additional Recommendations:  Limitations on Scope of Treatment: Treat the treatable, but no extraordinary measures such as CPR, intubation, or PEG tube.  Code Status:    Code Status Orders        Start     Ordered   05/06/17 2258  Do not attempt resuscitation (DNR)  Continuous    Question Answer Comment  In the event of cardiac or respiratory ARREST Do not call a "code blue"   In the event of cardiac or respiratory ARREST Do not  perform Intubation, CPR, defibrillation or ACLS   In the event of cardiac or respiratory ARREST Use medication by any route, position, wound care, and other measures to relive pain and suffering. May use oxygen, suction and manual treatment of airway obstruction as needed for comfort.      05/06/17 2257    Code Status History    Date Active Date Inactive Code Status Order ID Comments User Context   05/06/2017  6:20 PM 05/06/2017 10:57 PM DNR [id number]  Fredia Sorrow, MD ED   04/27/2017 11:18 PM 05/02/2017  8:31 PM DNR [id number]  Orvan Falconer, MD Inpatient   07/05/2014  8:46 PM 07/07/2014  8:14 PM Full Code [id number]  Marianna Payment, MD Inpatient   07/03/2014 11:41 PM 07/05/2014  8:46 PM Full Code [id number]  Leone Haven, MD Inpatient   07/03/2014 11:07 PM 07/03/2014 11:41 PM Full Code [id number]  Janora Norlander, DO Inpatient    Advance Directive Documentation     Most Recent Value  Type of Advance Directive  Healthcare Power of Attorney, Out of facility DNR (pink MOST or yellow form)  Pre-existing out of facility DNR order (yellow form or pink MOST form)  Yellow form placed in chart (order not valid for inpatient use)  "MOST" Form in Place?  -       Prognosis:   < 3 months or less would not be surprising based on recent stroke, decreased functional status, possible evolution or extension of stroke, poor PO intake, unable to maintain core body temperature.  Discharge Planning:  likely return to [facility] for rehab  Care plan was discussed with nursing staff, case manager, social worker, and Dr. Carles Collet.   Thank you for allowing the Palliative Medicine Team to assist in the care of this patient.   Time In: 1100 Time Out: 1215 Total Time 75 minutes Prolonged Time Billed  yes       Greater than 50%  of this time was spent counseling and coordinating care related to the above assessment and plan.  Drue Novel, NP  Please contact Palliative Medicine Team phone at [phone]  for questions and concerns.

## 2017-05-10 NOTE — Care Management Important Message (Signed)
Important Message  Patient Details  Name: [name] MRN: [id number] Date of Birth: 10/22/1932   Medicare Important Message Given:  Yes    Annalynne Ibanez, Chauncey Reading, RN 05/10/2017, 3:19 PM

## 2017-05-11 LAB — CULTURE, BLOOD (ROUTINE X 2)
CULTURE: NO GROWTH
Culture: NO GROWTH
SPECIAL REQUESTS: ADEQUATE
SPECIAL REQUESTS: ADEQUATE

## 2017-05-11 MED ORDER — RESOURCE THICKENUP CLEAR PO POWD: ORAL | 0 refills | Status: DC

## 2017-05-11 NOTE — Progress Notes (Signed)
  Speech Language Pathology Treatment: Dysphagia  Patient Details Name: [name] MRN: [id number] DOB: [birth year] Today's Date: 05/11/2017 Time: 4098-1191 SLP Time Calculation (min) (ACUTE ONLY): 29 min  Assessment / Plan / Recommendation Clinical Impression  Pt seen at bedside for ongoing diagnostic dysphagia intervention following MBSS completed yesterday. Pt very alert and talkative this AM. Pt consumed 100% of eggs, sausage, and Magic Cup with mild/mod cues for repeat swallows and occasional throat clear. Pt with immediate and delayed cough with NTL via teaspoon. Improved performance with honey-thick liquids via teaspoon only. Will advance to honey-thick (from pudding thick) and continue D1/puree. Pt will need ongoing dysphagia intervention at receiving facility. Above to RN. Pt at risk for aspiration due to recent lateral medullary CVA, however risks can be minimized with feeding assist and modified diet. Continue po medications crushed in puree.   HPI HPI: 83 year old male with history of remote prostate cancer, dementia, glaucoma, and recent admission 6/23-28 4 stroke who was discharged to a skilled nursing facility. The patient was transferred back to the hospital after he was found to be throwing up blood and developing hypothermia. In the emergency department, he had no further emesis. His initial temperature was 20F. He was placed on a bear hugger and started on vancomycin and Zosyn for possible sepsis. MRI demonstrates a possible subacute extension of a stroke seen during his previous hospitalization. His white blood cell count has been normal. Even though his chest x-ray was unremarkable, he has a coarse cough suggestive of pneumonia or aspiration. His urinalysis was unremarkable. Blood cultures were obtained and are pending. TSH was within normal limits and cortisol level has not yet been drawn. MRI shows: Subacute right lateral medullary infarct with questionable increased extent  compared to 04/28/2017 MRI. Subacute infarcts in the left cerebellum and bilateral cerebral cortex are fading. No new site of ischemia; no hemorrhagic conversion. BSE ordered due to suspicion of aspiration PNA. Pt passed RN swallow screen during both admissions (last week and yesterday). Pt reportedly tolerating diet well when alert.      SLP Plan  Continue with current plan of care       Recommendations  Diet recommendations: Honey-thick liquid;Dysphagia 1 (puree) Liquids provided via: Teaspoon Medication Administration: Crushed with puree Supervision: Staff to assist with self feeding;Full supervision/cueing for compensatory strategies Compensations: Minimize environmental distractions;Slow rate;Small sips/bites Postural Changes and/or Swallow Maneuvers: Seated upright 90 degrees;Upright 30-60 min after meal                Oral Care Recommendations: Oral care BID;Staff/trained caregiver to provide oral care Follow up Recommendations: Skilled Nursing facility SLP Visit Diagnosis: Dysphagia, oropharyngeal phase (R13.12) Plan: Continue with current plan of care       Thank you,  [name], [name]   [name] 05/11/2017, 8:54 AM

## 2017-05-11 NOTE — Clinical Social Work Note (Signed)
CSW called by RN Ander Purpura stating pt ready for d/c today and returning to [facility]. CSW spoke with [place] [phone]) at [facility] who refused to accept pt today due to him just coming out of ICU this morning. Lelan Pons expressed concern about d/c summary comment "if pt improves at SNF, pt can go home." Lelan Pons states they will reevaluate admission on Sunday.   CSW updated RN Ander Purpura. RN to update MD. CSW continuing to follow for discharge needs.   Oretha Ellis, [place], [place] Work Letitia Libra coverage)  [phone]

## 2017-05-11 NOTE — Discharge Summary (Signed)
Physician Discharge Summary  [name] NWG:[id number] DOB: 25-Apr-1932 DOA: 05/06/2017  PCP: Dione Housekeeper, MD  Admit date: 05/06/2017 Discharge date: 05/11/2017  Ad[facility]: [name] Disposition:  [name]  Recommendations for Outpatient Follow-up:  1. Follow up with PCP in 1-2 weeks 2. Please obtain BMP/CBC in one week 3. Please consult palliative care services to see patient 4. Please consult speech therapy to see patient within one week 5. Please assist the patient with meals--the patient should take small bites and be set up at 90 with meals.     Discharge Condition: Stable CODE STATUS:*DNR Diet recommendation: dysphagia 1 diet with honey thickened liquids   Brief/Interim Summary: 83 year old male with history of remote prostate cancer, dementia, glaucoma, and recent admission 6/23-28 4 stroke who was discharged to a skilled nursing facility. The patient was transferred back to the hospital after he was found to be throwing up blood and developing hypothermia. In the emergency department, he had no further emesis. His initial temperature was 92F. He was placed on a bear hugger and started on vancomycin and Zosyn for possible sepsis. The patient continued to have intermittent hypothermia even with rectal temperature monitoring in the stepdown unit. His temperature gradually improved to 93-90 42F. Blood cultures were unremarkable. Urinalysis did not suggest UTI. Repeat chest x-ray did not show any infiltrates. Nevertheless, the patient received approximately 5 days of intravenous Zosyn. MRI demonstrates a possible subacute extension of a stroke seen during his previous hospitalization.  The case was discussed with neurology, Dr. Leonel Ramsay on the telephone. He did not feel the patient warranted any further workup at this time. The patient will continue on his current antiplatelet regimen. His white blood cell count has been normal. Even though his chest x-ray was  unremarkable, he has a coarse cough suggestive of pneumonia or aspiration.  TSH was within normal limits and Cortrosyn stimulation test was normal.  Ultimately, the patient's temperature improved off the warming blanket. Patient's temperature remained 97-98.83F for a period Over 48 hours prior to discharge. In addition, the patient's mental status continued to improve and his oral intake improved.  Discharge Diagnoses:  Hypothermia, likely due to underlying infection (suspect aspiration pneumonia)versus extension of stroke - Discontinue vancomycin as MRSA PCR is negative - blood cultures--neg to date - UA--neg for pyuria - PCT <0.10 -TSH 3.434 - discontinue abx and monitor--remained stable without hypothermia or hemodynamic instability >24 hours off abx - 05/10/2017 Cortrosyn stimulation test normal - Initially on Bear hugger as needed to keep temperature within normal limits - MRI demonstrated a possible extension of one of the strokes that was seen during his previous hospitalization - SLP evaluation--recommended modified barium swallow--> ultimately recommended dysphagia 1 diet with pudding thickened liquids however modified barium swallow may have been compromised by the patient's hiccups -repeat evalution by speech felt pt can be discharged with Dysphagia 1 diet with honey thickened liquid -recommend repeat speech therapy eval at SNF in 1 week - Repeat CXR(after hydration)--neg - Has had any hypothermia for over 48 hours  Hematemesis - None observed since admission--hemoglobin remained stable throughout the hospitalization - BID PPI  Subacute strokes with extension of recent stroke -05/07/2017 MRI brain subacute right lateral medullary infarct with increased extension, no new stroke. -Extension of stroke likely due to relative hypotension - PT/OT/SLP reevaluations-->SNF - Continue daily aspirin - LDL was 66, A1c 5.4, ECHO with preserved EF of 60-65%, carotid duplex  with 1-39% stenosis a week ago - Per Neurology, due to amyloid angiopathy and  dementia, patient is not a good candidate for anticoagulation and they did not pursue further work up during his previous hospitalization -Cased was discussed with neurology, Dr. Sherin Quarry need for transfer or further workup -LDL 66 - F/u with Dr. Leonie Man as outpatient  Dysphagia -evaluated by speech therapy -d/c with dysphagia 1 diet with honey thickened liquids  Bradycardia, chronic,  - may be side effect ofAricept - May be exacerbated by hypothermia -No AV blocks or pauses on telemetry--personally reviewed tele  Dementia/Goals of Care -confusion worse after recent CVAs, although patient had apparently progressive disease prior -DNR status confirmed with family -palliative care meeting 7/6-->plan to d/c to SNF with palliative services -case discussed with palliative medicine team -if pt improves at SNF, pt can go home.  Family undecided if they would bring pt back to hospital if he clinically declines at SNF  Elevated BP -BP has remained labile -will not start any agents -please continue to monitor BP at SNF to determine if need to start any agents  Discharge Instructions  Discharge Instructions    Diet - low sodium heart healthy    Complete by:  As directed    Increase activity slowly    Complete by:  As directed      Allergies as of 05/11/2017   No Known Allergies     Medication List    TAKE these medications   aspirin 325 MG tablet Take 1 tablet (325 mg total) by mouth daily.   donepezil 10 MG tablet Commonly known as:  ARICEPT Take 1 tablet (10 mg total) by mouth at bedtime.   furosemide 20 MG tablet Commonly known as:  LASIX Take 20 mg by mouth daily as needed for fluid.   latanoprost 0.005 % ophthalmic solution Commonly known as:  XALATAN Place 1 drop into both eyes at bedtime.   memantine 10 MG tablet Commonly known as:  NAMENDA Take 1 tablet (10 mg total) by mouth  2 (two) times daily.   RESOURCE THICKENUP CLEAR Powd Please use to thicken liquid to honey thickened consistency   Vitamin D 2000 units Caps Take 1 capsule by mouth daily.       No Known Allergies  Consultations:  none   Procedures/Studies: Ct Angio Head W Or Wo Contrast  Result Date: 04/30/2017 CLINICAL DATA:  Acute encephalopathy and syncopal episode. Multiple small acute brainstem and posterior fossa infarcts. EXAM: CT ANGIOGRAPHY HEAD AND NECK TECHNIQUE: Multidetector CT imaging of the head and neck was performed using the standard protocol during bolus administration of intravenous contrast. Multiplanar CT image reconstructions and MIPs were obtained to evaluate the vascular anatomy. Carotid stenosis measurements (when applicable) are obtained utilizing NASCET criteria, using the distal internal carotid diameter as the denominator. CONTRAST:  50 mL Isovue 370 COMPARISON:  Brain MRI 04/28/2017 FINDINGS: CT HEAD FINDINGS Brain: No hemorrhage or mass effect. There is faint hypoattenuation at the sites of diffusion restriction described on recent MRI. There is periventricular hypoattenuation compatible with chronic microvascular disease. Vascular: No hyperdense vessel or unexpected calcification. Skull: Normal visualized skull base, calvarium and extracranial soft tissues. Sinuses/Orbits: No sinus fluid levels or advanced mucosal thickening. No mastoid effusion. Normal orbits. CTA NECK FINDINGS Aortic arch: There is no aneurysm or dissection of the visualized ascending aorta or aortic arch. There is a normal 3 vessel branching pattern. The visualized proximal subclavian arteries are normal. Right carotid system: The right common carotid origin is widely patent. There is no common carotid or internal carotid artery dissection or aneurysm.  No hemodynamically significant stenosis. Left carotid system: The left common carotid origin is widely patent. There is no common carotid or internal carotid  artery dissection or aneurysm. No hemodynamically significant stenosis. Vertebral arteries: The vertebral system is left dominant. There is calcification in the left P1 segment. The right vertebral artery is diminutive along its entire course. There is no flow demonstrated in the right V4 segment. Skeleton: There is no bony spinal canal stenosis. No lytic or blastic lesions. Other neck: The nasopharynx is clear. The oropharynx and hypopharynx are normal. The epiglottis is normal. The supraglottic larynx, glottis and subglottic larynx are normal. No retropharyngeal collection. The parapharyngeal spaces are preserved. The parotid and submandibular glands are normal. No sialolithiasis or salivary ductal dilatation. The thyroid gland is normal. There is no cervical lymphadenopathy. Upper chest: There is ground-glass opacity in the posterior right upper lobe. Small bilateral pleural effusions with associated atelectasis. Review of the MIP images confirms the above findings CTA HEAD FINDINGS Anterior circulation: --Intracranial internal carotid arteries: Normal. --Anterior cerebral arteries: Normal. --Middle cerebral arteries: Normal. --Posterior communicating arteries: Present on the right. Posterior circulation: --Posterior cerebral arteries: Normal. --Superior cerebellar arteries: Normal. --Basilar artery: Normal. --Anterior inferior cerebellar arteries: Normal. --Posterior inferior cerebellar arteries: Left dominant. Venous sinuses: As permitted by contrast timing, patent. Anatomic variants: None Delayed phase: No parenchymal contrast enhancement. Review of the MIP images confirms the above findings IMPRESSION: 1. No emergent large vessel occlusion. 2. No hemodynamically significant stenosis of the major cervical arteries. 3. Congenitally diminutive right vertebral artery. Electronically Signed   By: Ulyses Jarred M.D.   On: 04/30/2017 21:22   Dg Chest 1 View  Result Date: 04/27/2017 CLINICAL DATA:  Found  unresponsive. EXAM: CHEST 1 VIEW COMPARISON:  Chest x-ray dated 12/25/2007. FINDINGS: Heart size is upper normal. Overall cardiomediastinal silhouette is stable in size and configuration. Lungs are clear. No pleural effusion or pneumothorax seen. Osseous and soft tissue structures about the chest are unremarkable. IMPRESSION: No active disease. Electronically Signed   By: Franki Cabot M.D.   On: 04/27/2017 20:50   Ct Head Wo Contrast  Result Date: 05/06/2017 CLINICAL DATA:  Hypothermia.  Altered mental status. EXAM: CT HEAD WITHOUT CONTRAST TECHNIQUE: Contiguous axial images were obtained from the base of the skull through the vertex without intravenous contrast. COMPARISON:  Head CT 04/30/2017, 04/27/2017.  Brain MRI 04/28/2017 FINDINGS: Brain: No acute intracranial hemorrhage. Small acute infarcts on prior MRI are not well seen by CT. Stable degree of atrophy and chronic small vessel ischemia. Remote lacunar infarcts in the right thalamus. No extra-axial fluid collection. Vascular: Atherosclerosis of skullbase vasculature without hyperdense vessel or abnormal calcification. Skull: No fracture or focal lesion. Sinuses/Orbits: Paranasal sinuses and mastoid air cells are clear. The visualized orbits are unremarkable. Bilateral cataract resection. Other: None. IMPRESSION: No acute abnormality. Stable degree of atrophy and chronic small vessel ischemia. Recent small infarcts on MRI are not well seen by CT. Electronically Signed   By: Jeb Levering M.D.   On: 05/06/2017 20:36   Ct Head Wo Contrast  Result Date: 04/27/2017 CLINICAL DATA:  Found unresponsive earlier today. Low blood pressure. Partial resuscitation. EXAM: CT HEAD WITHOUT CONTRAST TECHNIQUE: Contiguous axial images were obtained from the base of the skull through the vertex without intravenous contrast. COMPARISON:  03/30/2015 brain MR. FINDINGS: Brain: No evidence for acute stroke, acute hemorrhage, mass lesion, or extra-axial fluid. There is  extreme atrophy, with hydrocephalus ex vacuo. Extensive involvement of the white matter by hypoattenuation, representing  chronic microvascular ischemic change. There is an old RIGHT thalamus lacunar infarct. Vascular: No hyperdense vessel or unexpected calcification. Skull: Normal. Negative for fracture or focal lesion. Sinuses/Orbits: No acute findings or layering fluid. BILATERAL cataract extraction. Other: None.  Compared with prior brain MR, similar appearance. IMPRESSION: Severe atrophy and extensive white matter disease. Evidence for chronic cerebral infarction. No acute intracranial findings are evident. Electronically Signed   By: Staci Righter M.D.   On: 04/27/2017 20:22   Ct Angio Neck W Or Wo Contrast  Result Date: 04/30/2017 CLINICAL DATA:  Acute encephalopathy and syncopal episode. Multiple small acute brainstem and posterior fossa infarcts. EXAM: CT ANGIOGRAPHY HEAD AND NECK TECHNIQUE: Multidetector CT imaging of the head and neck was performed using the standard protocol during bolus administration of intravenous contrast. Multiplanar CT image reconstructions and MIPs were obtained to evaluate the vascular anatomy. Carotid stenosis measurements (when applicable) are obtained utilizing NASCET criteria, using the distal internal carotid diameter as the denominator. CONTRAST:  50 mL Isovue 370 COMPARISON:  Brain MRI 04/28/2017 FINDINGS: CT HEAD FINDINGS Brain: No hemorrhage or mass effect. There is faint hypoattenuation at the sites of diffusion restriction described on recent MRI. There is periventricular hypoattenuation compatible with chronic microvascular disease. Vascular: No hyperdense vessel or unexpected calcification. Skull: Normal visualized skull base, calvarium and extracranial soft tissues. Sinuses/Orbits: No sinus fluid levels or advanced mucosal thickening. No mastoid effusion. Normal orbits. CTA NECK FINDINGS Aortic arch: There is no aneurysm or dissection of the visualized ascending  aorta or aortic arch. There is a normal 3 vessel branching pattern. The visualized proximal subclavian arteries are normal. Right carotid system: The right common carotid origin is widely patent. There is no common carotid or internal carotid artery dissection or aneurysm. No hemodynamically significant stenosis. Left carotid system: The left common carotid origin is widely patent. There is no common carotid or internal carotid artery dissection or aneurysm. No hemodynamically significant stenosis. Vertebral arteries: The vertebral system is left dominant. There is calcification in the left P1 segment. The right vertebral artery is diminutive along its entire course. There is no flow demonstrated in the right V4 segment. Skeleton: There is no bony spinal canal stenosis. No lytic or blastic lesions. Other neck: The nasopharynx is clear. The oropharynx and hypopharynx are normal. The epiglottis is normal. The supraglottic larynx, glottis and subglottic larynx are normal. No retropharyngeal collection. The parapharyngeal spaces are preserved. The parotid and submandibular glands are normal. No sialolithiasis or salivary ductal dilatation. The thyroid gland is normal. There is no cervical lymphadenopathy. Upper chest: There is ground-glass opacity in the posterior right upper lobe. Small bilateral pleural effusions with associated atelectasis. Review of the MIP images confirms the above findings CTA HEAD FINDINGS Anterior circulation: --Intracranial internal carotid arteries: Normal. --Anterior cerebral arteries: Normal. --Middle cerebral arteries: Normal. --Posterior communicating arteries: Present on the right. Posterior circulation: --Posterior cerebral arteries: Normal. --Superior cerebellar arteries: Normal. --Basilar artery: Normal. --Anterior inferior cerebellar arteries: Normal. --Posterior inferior cerebellar arteries: Left dominant. Venous sinuses: As permitted by contrast timing, patent. Anatomic variants:  None Delayed phase: No parenchymal contrast enhancement. Review of the MIP images confirms the above findings IMPRESSION: 1. No emergent large vessel occlusion. 2. No hemodynamically significant stenosis of the major cervical arteries. 3. Congenitally diminutive right vertebral artery. Electronically Signed   By: Ulyses Jarred M.D.   On: 04/30/2017 21:22   Mr Jodene Nam Head Wo Contrast  Result Date: 05/07/2017 CLINICAL DATA:  Hypothermia. Altered mental status. History of dementia. EXAM:  MRI HEAD WITHOUT CONTRAST MRA HEAD WITHOUT CONTRAST TECHNIQUE: Multiplanar, multiecho pulse sequences of the brain and surrounding structures were obtained without intravenous contrast. Angiographic images of the head were obtained using MRA technique without contrast. COMPARISON:  04/28/2017 FINDINGS: MRI HEAD FINDINGS Brain: Subacute right lateral medullary infarct appears more extensive than on prior. A subacute left cerebellar infarct on the previous study has decreased in restriction intensity. Cerebral cortical infarcts seen previously are no longer visualized. There are numerous small remote bilateral cerebellar infarcts. Remote lacunar infarct in the right thalamus. Atrophy with ventriculomegaly, especially of the temporal and occipital lobes. There is chronic confluent FLAIR hyperintensity in the cerebral white matter with remote lacunes in the bilateral centrum semiovale. The corpus callosum is thinned the setting of cerebral volume loss. Numerous foci of chronic lobar microhemorrhage. Vascular: Arterial findings below. Normal dural venous sinus flow voids. Skull and upper cervical spine: Negative for marrow lesion. Sinuses/Orbits: No acute finding.  Bilateral cataract resection. MRA HEAD FINDINGS Solitary visible left vertebral artery, stable from CTA 04/28/2017. The right vertebral artery is hypoplastic and may be occluded at the V4 segment. Mild proximal basilar atheromatous type narrowing. The right P1 segment is  hypoplastic. Right P2 segment is attenuated and appears diffusely irregular, apparently new from prior but potentially artifactual on this motion degraded study. No reversible flow limiting stenosis in the anterior circulation. There is moderate irregularity of bilateral MCA branches, again potentially related to artifact rather than atherosclerosis. IMPRESSION: 1. Subacute right lateral medullary infarct with questionable increased extent compared to 04/28/2017 MRI. Subacute infarcts in the left cerebellum and bilateral cerebral cortex are fading. No new site of ischemia; no hemorrhagic conversion. 2. Motion degraded MRA which may account for apparent newly narrowed bilateral M2 and right P2 segments when compared to CTA 04/30/2017. 3. Advanced atrophy and chronic white matter disease. Remote lobar micro hemorrhages may reflect underlying amyloid angiography. Electronically Signed   By: Monte Fantasia M.D.   On: 05/07/2017 10:31   Mr Brain Wo Contrast  Result Date: 05/07/2017 CLINICAL DATA:  Hypothermia. Altered mental status. History of dementia. EXAM: MRI HEAD WITHOUT CONTRAST MRA HEAD WITHOUT CONTRAST TECHNIQUE: Multiplanar, multiecho pulse sequences of the brain and surrounding structures were obtained without intravenous contrast. Angiographic images of the head were obtained using MRA technique without contrast. COMPARISON:  04/28/2017 FINDINGS: MRI HEAD FINDINGS Brain: Subacute right lateral medullary infarct appears more extensive than on prior. A subacute left cerebellar infarct on the previous study has decreased in restriction intensity. Cerebral cortical infarcts seen previously are no longer visualized. There are numerous small remote bilateral cerebellar infarcts. Remote lacunar infarct in the right thalamus. Atrophy with ventriculomegaly, especially of the temporal and occipital lobes. There is chronic confluent FLAIR hyperintensity in the cerebral white matter with remote lacunes in the  bilateral centrum semiovale. The corpus callosum is thinned the setting of cerebral volume loss. Numerous foci of chronic lobar microhemorrhage. Vascular: Arterial findings below. Normal dural venous sinus flow voids. Skull and upper cervical spine: Negative for marrow lesion. Sinuses/Orbits: No acute finding.  Bilateral cataract resection. MRA HEAD FINDINGS Solitary visible left vertebral artery, stable from CTA 04/28/2017. The right vertebral artery is hypoplastic and may be occluded at the V4 segment. Mild proximal basilar atheromatous type narrowing. The right P1 segment is hypoplastic. Right P2 segment is attenuated and appears diffusely irregular, apparently new from prior but potentially artifactual on this motion degraded study. No reversible flow limiting stenosis in the anterior circulation. There is moderate irregularity of  bilateral MCA branches, again potentially related to artifact rather than atherosclerosis. IMPRESSION: 1. Subacute right lateral medullary infarct with questionable increased extent compared to 04/28/2017 MRI. Subacute infarcts in the left cerebellum and bilateral cerebral cortex are fading. No new site of ischemia; no hemorrhagic conversion. 2. Motion degraded MRA which may account for apparent newly narrowed bilateral M2 and right P2 segments when compared to CTA 04/30/2017. 3. Advanced atrophy and chronic white matter disease. Remote lobar micro hemorrhages may reflect underlying amyloid angiography. Electronically Signed   By: Monte Fantasia M.D.   On: 05/07/2017 10:31   Mr Brain Wo Contrast  Result Date: 04/28/2017 CLINICAL DATA:  Found unresponsive. Syncope and collapse. Prior stroke. EXAM: MRI HEAD WITHOUT CONTRAST TECHNIQUE: Multiplanar, multiecho pulse sequences of the brain and surrounding structures were obtained without intravenous contrast. COMPARISON:  Head CT 04/27/2017 and MRI 03/30/2015 FINDINGS: Brain: Small acute infarcts are present in the right medulla and  posteroinferior left cerebellar hemisphere. There are also punctate acute cortical infarcts in the high posterior frontal lobes bilaterally. Scattered chronic cerebral and cerebellar microhemorrhages are again seen. A small chronic right occipital cortical infarct is new or larger than on the prior MRI. There also may be a tiny left occipital chronic cortical infarct. Patchy to confluent cerebral white matter T2 hyperintensities are similar to the prior MRI and nonspecific but compatible with extensive chronic small vessel ischemic disease. There are numerous small chronic infarcts in the cerebellum bilaterally. Chronic lacunar infarcts are also again seen in the anterior left centrum semiovale and right thalamus. There is marked cerebral atrophy. No mass, midline shift, or extra-axial fluid collection is seen. Vascular: Unchanged poor visualization of the distal right vertebral artery, possibly occluded. Dominant appearing left vertebral artery with grossly preserved vascular flow voids elsewhere. Skull and upper cervical spine: Unremarkable bone marrow signal. Sinuses/Orbits: Bilateral cataract extraction. Paranasal sinuses and mastoid air cells are clear. Other: None. IMPRESSION: 1. 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 2. Advanced cerebral atrophy and chronic small vessel ischemic disease with chronic infarcts as above. Electronically Signed   By: Logan Bores M.D.   On: 04/28/2017 12:13   US Carotid Bilateral (at [facility] Only)  Result Date: 04/29/2017 CLINICAL DATA:  Stroke.  Syncopal episode. EXAM: BILATERAL CAROTID DUPLEX ULTRASOUND TECHNIQUE: Pearline Cables scale imaging, color Doppler and duplex ultrasound were performed of bilateral carotid and vertebral arteries in the neck. COMPARISON:  None. FINDINGS: Criteria: Quantification of carotid stenosis is based on velocity parameters that correlate the residual internal carotid diameter with NASCET-based stenosis  levels, using the diameter of the distal internal carotid lumen as the denominator for stenosis measurement. The following velocity measurements were obtained: RIGHT ICA:  45/6 cm/sec CCA:  096/28 cm/sec SYSTOLIC ICA/CCA RATIO:  0.4 DIASTOLIC ICA/CCA RATIO:  0.4 ECA:  105 cm/sec LEFT ICA:  78/6 cm/sec CCA:  366/29 cm/sec SYSTOLIC ICA/CCA RATIO:  0.7 DIASTOLIC ICA/CCA RATIO:  0.6 ECA:  102 cm/sec RIGHT CAROTID ARTERY: There is a very minimal amount of intimal thickening within the right carotid bulb (image 17)), extending to involve the origin and proximal aspects of the right internal carotid artery (image 25), not resulting in elevated peak systolic velocities within the interrogated course the right internal carotid artery to suggest a hemodynamically significant stenosis. RIGHT VERTEBRAL ARTERY:  Antegrade flow LEFT CAROTID ARTERY: There is a moderate amount of eccentric mixed echogenic plaque within the left carotid bulb (images 49 and 51), not resulting in elevated peak  systolic velocities within the interrogated course of the left internal carotid artery to suggest a hemodynamically significant stenosis. LEFT VERTEBRAL ARTERY:  Antegrade flow IMPRESSION: 1. Moderate amount of left-sided atherosclerotic plaque, not resulting in a hemodynamically significant stenosis. 2. Minimal amount of right-sided intimal wall thickening, not resulting in a hemodynamically significant stenosis. Electronically Signed   By: Sandi Mariscal M.D.   On: 04/29/2017 11:56   Dg Chest Port 1 View  Result Date: 05/08/2017 CLINICAL DATA:  Rhonchi.  Congestion. EXAM: PORTABLE CHEST 1 VIEW COMPARISON:  05/06/2017 FINDINGS: Upright AP view of the chest was obtained. There is no focal airspace disease or pulmonary edema. There is a skin fold overlying the lateral right chest. Negative for a pneumothorax. Heart and mediastinum are with normal limits and stable. No acute bone abnormality. IMPRESSION: No active disease. Electronically Signed    By: Markus Daft M.D.   On: 05/08/2017 10:58   Dg Chest Port 1 View  Result Date: 05/06/2017 CLINICAL DATA:  Hypoxia EXAM: PORTABLE CHEST 1 VIEW COMPARISON:  April 27, 2017 FINDINGS: The heart size and mediastinal contours are stable. The aorta is tortuous. Both lungs are clear. Degenerative joint changes of the shoulders are noted. IMPRESSION: No active cardiopulmonary disease. Electronically Signed   By: Abelardo Diesel M.D.   On: 05/06/2017 18:18   Dg Swallowing Func-speech Pathology  Result Date: 05/10/2017 Clinical Impression             Pt currently with a moderate- severe oropharyngeal/ cervical esophageal phase dysphagia. Oral phase of swallow is disorganized with tongue pumping, piecemeal swallowing, delayed oral transit, and premature spill to the level of the pyriform sinuses. Pt also with reduced base of tongue retraction and occasional reduced airway closure resulting in silent aspiration of nectar-thick liquids- trace with teaspoon sip, frank with straw sips; pt able to clear most of aspirated material when cued to clear throat. Pt also with decreased UES opening and moderate-severe residuals in the pharynx; pt had hiccups during the study and there was backflow from the upper esophagus into the pharynx as well. Suspect that pt aspirated some of the residuals/ backflow as pt developed a wet vocal quality during the study; pt also expectorated significant amounts of what appeared to be barium mixed with saliva throughout the study. Pt appeared to have greater success with puree bolus, likely from the increased sensory input with a heavier material which resulted in fewer residuals and no aspiration of puree material was observed. The quality of the study was also limited by the pt's positioning (head leaning on wall, shoulder obstructing view at times). Aspiration risk is high at this time. If plan is to continue with PO intake, the safest would be puree and pudding-thick liquids, meds crushed in puree  with oral care before and after meal. Would highly recommend for the study to be repeated as the pt's hiccups appeared to increase backflow from the esophagus; additionally pt seemed more lethargic compared to visit this morning. Will continue to follow.  Swallow Evaluation Recommendations      SLP Diet Recommendations: NPO vs Dysphagia 1 (Puree) solids;Pudding thick liquid      Medication Administration: Crushed with puree   Supervision: Full assist for feeding;Full supervision/cueing for compensatory strategies   Compensations: Minimize environmental distractions;Slow rate;Small sips/bites   Postural Changes: Remain semi-upright after after feeds/meals (Comment);Seated upright at 90 degrees   Oral Care Recommendations: Oral care before and after PO       Amy Dionicia Abler, MA, CCC-SLP  05/10/2017,1:37 PM [phone]   Electronically signed by Kern Reap, CCC-SLP at 05/10/2017 1:45 PM Electronically signed by Kern Reap, CCC-SLP at 05/10/2017 1:48 PM         Discharge Exam: Vitals:   05/11/17 0800 05/11/17 0811  BP: (!) 175/92   Pulse: (!) 49 (!) 54  Resp: 11 12  Temp:  (!) 97.4 F (36.3 C)   Vitals:   05/11/17 0400 05/11/17 0700 05/11/17 0800 05/11/17 0811  BP:  (!) 180/79 (!) 175/92   Pulse:  (!) 52 (!) 49 (!) 54  Resp:  13 11 12   Temp: (!) 97.2 F (36.2 C)   (!) 97.4 F (36.3 C)  TempSrc: Axillary   Oral  SpO2:  100% 100% 100%  Weight: 65.8 kg (145 lb 1 oz)     Height:        General: Pt is alert, awake, not in acute distress Cardiovascular: RRR, S1/S2 +, no rubs, no gallops Respiratory: CTA bilaterally, no wheezing, no rhonchi Abdominal: Soft, NT, ND, bowel sounds + Extremities: no edema, no cyanosis   The results of significant diagnostics from this hospitalization (including imaging, microbiology, ancillary and laboratory) are listed below for reference.    Significant Diagnostic Studies: Ct Angio Head W Or Wo Contrast  Result Date: 04/30/2017 CLINICAL DATA:   Acute encephalopathy and syncopal episode. Multiple small acute brainstem and posterior fossa infarcts. EXAM: CT ANGIOGRAPHY HEAD AND NECK TECHNIQUE: Multidetector CT imaging of the head and neck was performed using the standard protocol during bolus administration of intravenous contrast. Multiplanar CT image reconstructions and MIPs were obtained to evaluate the vascular anatomy. Carotid stenosis measurements (when applicable) are obtained utilizing NASCET criteria, using the distal internal carotid diameter as the denominator. CONTRAST:  50 mL Isovue 370 COMPARISON:  Brain MRI 04/28/2017 FINDINGS: CT HEAD FINDINGS Brain: No hemorrhage or mass effect. There is faint hypoattenuation at the sites of diffusion restriction described on recent MRI. There is periventricular hypoattenuation compatible with chronic microvascular disease. Vascular: No hyperdense vessel or unexpected calcification. Skull: Normal visualized skull base, calvarium and extracranial soft tissues. Sinuses/Orbits: No sinus fluid levels or advanced mucosal thickening. No mastoid effusion. Normal orbits. CTA NECK FINDINGS Aortic arch: There is no aneurysm or dissection of the visualized ascending aorta or aortic arch. There is a normal 3 vessel branching pattern. The visualized proximal subclavian arteries are normal. Right carotid system: The right common carotid origin is widely patent. There is no common carotid or internal carotid artery dissection or aneurysm. No hemodynamically significant stenosis. Left carotid system: The left common carotid origin is widely patent. There is no common carotid or internal carotid artery dissection or aneurysm. No hemodynamically significant stenosis. Vertebral arteries: The vertebral system is left dominant. There is calcification in the left P1 segment. The right vertebral artery is diminutive along its entire course. There is no flow demonstrated in the right V4 segment. Skeleton: There is no bony spinal  canal stenosis. No lytic or blastic lesions. Other neck: The nasopharynx is clear. The oropharynx and hypopharynx are normal. The epiglottis is normal. The supraglottic larynx, glottis and subglottic larynx are normal. No retropharyngeal collection. The parapharyngeal spaces are preserved. The parotid and submandibular glands are normal. No sialolithiasis or salivary ductal dilatation. The thyroid gland is normal. There is no cervical lymphadenopathy. Upper chest: There is ground-glass opacity in the posterior right upper lobe. Small bilateral pleural effusions with associated atelectasis. Review of the MIP images confirms the above findings CTA HEAD FINDINGS Anterior  circulation: --Intracranial internal carotid arteries: Normal. --Anterior cerebral arteries: Normal. --Middle cerebral arteries: Normal. --Posterior communicating arteries: Present on the right. Posterior circulation: --Posterior cerebral arteries: Normal. --Superior cerebellar arteries: Normal. --Basilar artery: Normal. --Anterior inferior cerebellar arteries: Normal. --Posterior inferior cerebellar arteries: Left dominant. Venous sinuses: As permitted by contrast timing, patent. Anatomic variants: None Delayed phase: No parenchymal contrast enhancement. Review of the MIP images confirms the above findings IMPRESSION: 1. No emergent large vessel occlusion. 2. No hemodynamically significant stenosis of the major cervical arteries. 3. Congenitally diminutive right vertebral artery. Electronically Signed   By: Ulyses Jarred M.D.   On: 04/30/2017 21:22   Dg Chest 1 View  Result Date: 04/27/2017 CLINICAL DATA:  Found unresponsive. EXAM: CHEST 1 VIEW COMPARISON:  Chest x-ray dated 12/25/2007. FINDINGS: Heart size is upper normal. Overall cardiomediastinal silhouette is stable in size and configuration. Lungs are clear. No pleural effusion or pneumothorax seen. Osseous and soft tissue structures about the chest are unremarkable. IMPRESSION: No active  disease. Electronically Signed   By: Franki Cabot M.D.   On: 04/27/2017 20:50   Ct Head Wo Contrast  Result Date: 05/06/2017 CLINICAL DATA:  Hypothermia.  Altered mental status. EXAM: CT HEAD WITHOUT CONTRAST TECHNIQUE: Contiguous axial images were obtained from the base of the skull through the vertex without intravenous contrast. COMPARISON:  Head CT 04/30/2017, 04/27/2017.  Brain MRI 04/28/2017 FINDINGS: Brain: No acute intracranial hemorrhage. Small acute infarcts on prior MRI are not well seen by CT. Stable degree of atrophy and chronic small vessel ischemia. Remote lacunar infarcts in the right thalamus. No extra-axial fluid collection. Vascular: Atherosclerosis of skullbase vasculature without hyperdense vessel or abnormal calcification. Skull: No fracture or focal lesion. Sinuses/Orbits: Paranasal sinuses and mastoid air cells are clear. The visualized orbits are unremarkable. Bilateral cataract resection. Other: None. IMPRESSION: No acute abnormality. Stable degree of atrophy and chronic small vessel ischemia. Recent small infarcts on MRI are not well seen by CT. Electronically Signed   By: Jeb Levering M.D.   On: 05/06/2017 20:36   Ct Head Wo Contrast  Result Date: 04/27/2017 CLINICAL DATA:  Found unresponsive earlier today. Low blood pressure. Partial resuscitation. EXAM: CT HEAD WITHOUT CONTRAST TECHNIQUE: Contiguous axial images were obtained from the base of the skull through the vertex without intravenous contrast. COMPARISON:  03/30/2015 brain MR. FINDINGS: Brain: No evidence for acute stroke, acute hemorrhage, mass lesion, or extra-axial fluid. There is extreme atrophy, with hydrocephalus ex vacuo. Extensive involvement of the white matter by hypoattenuation, representing chronic microvascular ischemic change. There is an old RIGHT thalamus lacunar infarct. Vascular: No hyperdense vessel or unexpected calcification. Skull: Normal. Negative for fracture or focal lesion. Sinuses/Orbits: No  acute findings or layering fluid. BILATERAL cataract extraction. Other: None.  Compared with prior brain MR, similar appearance. IMPRESSION: Severe atrophy and extensive white matter disease. Evidence for chronic cerebral infarction. No acute intracranial findings are evident. Electronically Signed   By: Staci Righter M.D.   On: 04/27/2017 20:22   Ct Angio Neck W Or Wo Contrast  Result Date: 04/30/2017 CLINICAL DATA:  Acute encephalopathy and syncopal episode. Multiple small acute brainstem and posterior fossa infarcts. EXAM: CT ANGIOGRAPHY HEAD AND NECK TECHNIQUE: Multidetector CT imaging of the head and neck was performed using the standard protocol during bolus administration of intravenous contrast. Multiplanar CT image reconstructions and MIPs were obtained to evaluate the vascular anatomy. Carotid stenosis measurements (when applicable) are obtained utilizing NASCET criteria, using the distal internal carotid diameter as the denominator. CONTRAST:  50 mL Isovue 370 COMPARISON:  Brain MRI 04/28/2017 FINDINGS: CT HEAD FINDINGS Brain: No hemorrhage or mass effect. There is faint hypoattenuation at the sites of diffusion restriction described on recent MRI. There is periventricular hypoattenuation compatible with chronic microvascular disease. Vascular: No hyperdense vessel or unexpected calcification. Skull: Normal visualized skull base, calvarium and extracranial soft tissues. Sinuses/Orbits: No sinus fluid levels or advanced mucosal thickening. No mastoid effusion. Normal orbits. CTA NECK FINDINGS Aortic arch: There is no aneurysm or dissection of the visualized ascending aorta or aortic arch. There is a normal 3 vessel branching pattern. The visualized proximal subclavian arteries are normal. Right carotid system: The right common carotid origin is widely patent. There is no common carotid or internal carotid artery dissection or aneurysm. No hemodynamically significant stenosis. Left carotid system: The  left common carotid origin is widely patent. There is no common carotid or internal carotid artery dissection or aneurysm. No hemodynamically significant stenosis. Vertebral arteries: The vertebral system is left dominant. There is calcification in the left P1 segment. The right vertebral artery is diminutive along its entire course. There is no flow demonstrated in the right V4 segment. Skeleton: There is no bony spinal canal stenosis. No lytic or blastic lesions. Other neck: The nasopharynx is clear. The oropharynx and hypopharynx are normal. The epiglottis is normal. The supraglottic larynx, glottis and subglottic larynx are normal. No retropharyngeal collection. The parapharyngeal spaces are preserved. The parotid and submandibular glands are normal. No sialolithiasis or salivary ductal dilatation. The thyroid gland is normal. There is no cervical lymphadenopathy. Upper chest: There is ground-glass opacity in the posterior right upper lobe. Small bilateral pleural effusions with associated atelectasis. Review of the MIP images confirms the above findings CTA HEAD FINDINGS Anterior circulation: --Intracranial internal carotid arteries: Normal. --Anterior cerebral arteries: Normal. --Middle cerebral arteries: Normal. --Posterior communicating arteries: Present on the right. Posterior circulation: --Posterior cerebral arteries: Normal. --Superior cerebellar arteries: Normal. --Basilar artery: Normal. --Anterior inferior cerebellar arteries: Normal. --Posterior inferior cerebellar arteries: Left dominant. Venous sinuses: As permitted by contrast timing, patent. Anatomic variants: None Delayed phase: No parenchymal contrast enhancement. Review of the MIP images confirms the above findings IMPRESSION: 1. No emergent large vessel occlusion. 2. No hemodynamically significant stenosis of the major cervical arteries. 3. Congenitally diminutive right vertebral artery. Electronically Signed   By: Ulyses Jarred M.D.   On:  04/30/2017 21:22   Mr Jodene Nam Head Wo Contrast  Result Date: 05/07/2017 CLINICAL DATA:  Hypothermia. Altered mental status. History of dementia. EXAM: MRI HEAD WITHOUT CONTRAST MRA HEAD WITHOUT CONTRAST TECHNIQUE: Multiplanar, multiecho pulse sequences of the brain and surrounding structures were obtained without intravenous contrast. Angiographic images of the head were obtained using MRA technique without contrast. COMPARISON:  04/28/2017 FINDINGS: MRI HEAD FINDINGS Brain: Subacute right lateral medullary infarct appears more extensive than on prior. A subacute left cerebellar infarct on the previous study has decreased in restriction intensity. Cerebral cortical infarcts seen previously are no longer visualized. There are numerous small remote bilateral cerebellar infarcts. Remote lacunar infarct in the right thalamus. Atrophy with ventriculomegaly, especially of the temporal and occipital lobes. There is chronic confluent FLAIR hyperintensity in the cerebral white matter with remote lacunes in the bilateral centrum semiovale. The corpus callosum is thinned the setting of cerebral volume loss. Numerous foci of chronic lobar microhemorrhage. Vascular: Arterial findings below. Normal dural venous sinus flow voids. Skull and upper cervical spine: Negative for marrow lesion. Sinuses/Orbits: No acute finding.  Bilateral cataract resection. MRA HEAD FINDINGS Solitary  visible left vertebral artery, stable from CTA 04/28/2017. The right vertebral artery is hypoplastic and may be occluded at the V4 segment. Mild proximal basilar atheromatous type narrowing. The right P1 segment is hypoplastic. Right P2 segment is attenuated and appears diffusely irregular, apparently new from prior but potentially artifactual on this motion degraded study. No reversible flow limiting stenosis in the anterior circulation. There is moderate irregularity of bilateral MCA branches, again potentially related to artifact rather than  atherosclerosis. IMPRESSION: 1. Subacute right lateral medullary infarct with questionable increased extent compared to 04/28/2017 MRI. Subacute infarcts in the left cerebellum and bilateral cerebral cortex are fading. No new site of ischemia; no hemorrhagic conversion. 2. Motion degraded MRA which may account for apparent newly narrowed bilateral M2 and right P2 segments when compared to CTA 04/30/2017. 3. Advanced atrophy and chronic white matter disease. Remote lobar micro hemorrhages may reflect underlying amyloid angiography. Electronically Signed   By: Monte Fantasia M.D.   On: 05/07/2017 10:31   Mr Brain Wo Contrast  Result Date: 05/07/2017 CLINICAL DATA:  Hypothermia. Altered mental status. History of dementia. EXAM: MRI HEAD WITHOUT CONTRAST MRA HEAD WITHOUT CONTRAST TECHNIQUE: Multiplanar, multiecho pulse sequences of the brain and surrounding structures were obtained without intravenous contrast. Angiographic images of the head were obtained using MRA technique without contrast. COMPARISON:  04/28/2017 FINDINGS: MRI HEAD FINDINGS Brain: Subacute right lateral medullary infarct appears more extensive than on prior. A subacute left cerebellar infarct on the previous study has decreased in restriction intensity. Cerebral cortical infarcts seen previously are no longer visualized. There are numerous small remote bilateral cerebellar infarcts. Remote lacunar infarct in the right thalamus. Atrophy with ventriculomegaly, especially of the temporal and occipital lobes. There is chronic confluent FLAIR hyperintensity in the cerebral white matter with remote lacunes in the bilateral centrum semiovale. The corpus callosum is thinned the setting of cerebral volume loss. Numerous foci of chronic lobar microhemorrhage. Vascular: Arterial findings below. Normal dural venous sinus flow voids. Skull and upper cervical spine: Negative for marrow lesion. Sinuses/Orbits: No acute finding.  Bilateral cataract resection.  MRA HEAD FINDINGS Solitary visible left vertebral artery, stable from CTA 04/28/2017. The right vertebral artery is hypoplastic and may be occluded at the V4 segment. Mild proximal basilar atheromatous type narrowing. The right P1 segment is hypoplastic. Right P2 segment is attenuated and appears diffusely irregular, apparently new from prior but potentially artifactual on this motion degraded study. No reversible flow limiting stenosis in the anterior circulation. There is moderate irregularity of bilateral MCA branches, again potentially related to artifact rather than atherosclerosis. IMPRESSION: 1. Subacute right lateral medullary infarct with questionable increased extent compared to 04/28/2017 MRI. Subacute infarcts in the left cerebellum and bilateral cerebral cortex are fading. No new site of ischemia; no hemorrhagic conversion. 2. Motion degraded MRA which may account for apparent newly narrowed bilateral M2 and right P2 segments when compared to CTA 04/30/2017. 3. Advanced atrophy and chronic white matter disease. Remote lobar micro hemorrhages may reflect underlying amyloid angiography. Electronically Signed   By: Monte Fantasia M.D.   On: 05/07/2017 10:31   Mr Brain Wo Contrast  Result Date: 04/28/2017 CLINICAL DATA:  Found unresponsive. Syncope and collapse. Prior stroke. EXAM: MRI HEAD WITHOUT CONTRAST TECHNIQUE: Multiplanar, multiecho pulse sequences of the brain and surrounding structures were obtained without intravenous contrast. COMPARISON:  Head CT 04/27/2017 and MRI 03/30/2015 FINDINGS: Brain: Small acute infarcts are present in the right medulla and posteroinferior left cerebellar hemisphere. There are also punctate acute cortical  infarcts in the high posterior frontal lobes bilaterally. Scattered chronic cerebral and cerebellar microhemorrhages are again seen. A small chronic right occipital cortical infarct is new or larger than on the prior MRI. There also may be a tiny left occipital  chronic cortical infarct. Patchy to confluent cerebral white matter T2 hyperintensities are similar to the prior MRI and nonspecific but compatible with extensive chronic small vessel ischemic disease. There are numerous small chronic infarcts in the cerebellum bilaterally. Chronic lacunar infarcts are also again seen in the anterior left centrum semiovale and right thalamus. There is marked cerebral atrophy. No mass, midline shift, or extra-axial fluid collection is seen. Vascular: Unchanged poor visualization of the distal right vertebral artery, possibly occluded. Dominant appearing left vertebral artery with grossly preserved vascular flow voids elsewhere. Skull and upper cervical spine: Unremarkable bone marrow signal. Sinuses/Orbits: Bilateral cataract extraction. Paranasal sinuses and mastoid air cells are clear. Other: None. IMPRESSION: 1. 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 2. Advanced cerebral atrophy and chronic small vessel ischemic disease with chronic infarcts as above. Electronically Signed   By: Logan Bores M.D.   On: 04/28/2017 12:13   US Carotid Bilateral (at [facility] And [facility] Only)  Result Date: 04/29/2017 CLINICAL DATA:  Stroke.  Syncopal episode. EXAM: BILATERAL CAROTID DUPLEX ULTRASOUND TECHNIQUE: Pearline Cables scale imaging, color Doppler and duplex ultrasound were performed of bilateral carotid and vertebral arteries in the neck. COMPARISON:  None. FINDINGS: Criteria: Quantification of carotid stenosis is based on velocity parameters that correlate the residual internal carotid diameter with NASCET-based stenosis levels, using the diameter of the distal internal carotid lumen as the denominator for stenosis measurement. The following velocity measurements were obtained: RIGHT ICA:  45/6 cm/sec CCA:  253/66 cm/sec SYSTOLIC ICA/CCA RATIO:  0.4 DIASTOLIC ICA/CCA RATIO:  0.4 ECA:  105 cm/sec LEFT ICA:  78/6 cm/sec CCA:  440/34 cm/sec SYSTOLIC ICA/CCA RATIO:   0.7 DIASTOLIC ICA/CCA RATIO:  0.6 ECA:  102 cm/sec RIGHT CAROTID ARTERY: There is a very minimal amount of intimal thickening within the right carotid bulb (image 17)), extending to involve the origin and proximal aspects of the right internal carotid artery (image 25), not resulting in elevated peak systolic velocities within the interrogated course the right internal carotid artery to suggest a hemodynamically significant stenosis. RIGHT VERTEBRAL ARTERY:  Antegrade flow LEFT CAROTID ARTERY: There is a moderate amount of eccentric mixed echogenic plaque within the left carotid bulb (images 49 and 51), not resulting in elevated peak systolic velocities within the interrogated course of the left internal carotid artery to suggest a hemodynamically significant stenosis. LEFT VERTEBRAL ARTERY:  Antegrade flow IMPRESSION: 1. Moderate amount of left-sided atherosclerotic plaque, not resulting in a hemodynamically significant stenosis. 2. Minimal amount of right-sided intimal wall thickening, not resulting in a hemodynamically significant stenosis. Electronically Signed   By: Sandi Mariscal M.D.   On: 04/29/2017 11:56   Dg Chest Port 1 View  Result Date: 05/08/2017 CLINICAL DATA:  Rhonchi.  Congestion. EXAM: PORTABLE CHEST 1 VIEW COMPARISON:  05/06/2017 FINDINGS: Upright AP view of the chest was obtained. There is no focal airspace disease or pulmonary edema. There is a skin fold overlying the lateral right chest. Negative for a pneumothorax. Heart and mediastinum are with normal limits and stable. No acute bone abnormality. IMPRESSION: No active disease. Electronically Signed   By: Markus Daft M.D.   On: 05/08/2017 10:58   Dg Chest Port 1 View  Result Date: 05/06/2017 CLINICAL DATA:  Hypoxia EXAM: PORTABLE CHEST 1 VIEW COMPARISON:  April 27, 2017 FINDINGS: The heart size and mediastinal contours are stable. The aorta is tortuous. Both lungs are clear. Degenerative joint changes of the shoulders are noted. IMPRESSION:  No active cardiopulmonary disease. Electronically Signed   By: Abelardo Diesel M.D.   On: 05/06/2017 18:18   Dg Swallowing Func-speech Pathology  Result Date: 05/10/2017 Clinical Impression             Pt currently with a moderate- severe oropharyngeal/ cervical esophageal phase dysphagia. Oral phase of swallow is disorganized with tongue pumping, piecemeal swallowing, delayed oral transit, and premature spill to the level of the pyriform sinuses. Pt also with reduced base of tongue retraction and occasional reduced airway closure resulting in silent aspiration of nectar-thick liquids- trace with teaspoon sip, frank with straw sips; pt able to clear most of aspirated material when cued to clear throat. Pt also with decreased UES opening and moderate-severe residuals in the pharynx; pt had hiccups during the study and there was backflow from the upper esophagus into the pharynx as well. Suspect that pt aspirated some of the residuals/ backflow as pt developed a wet vocal quality during the study; pt also expectorated significant amounts of what appeared to be barium mixed with saliva throughout the study. Pt appeared to have greater success with puree bolus, likely from the increased sensory input with a heavier material which resulted in fewer residuals and no aspiration of puree material was observed. The quality of the study was also limited by the pt's positioning (head leaning on wall, shoulder obstructing view at times). Aspiration risk is high at this time. If plan is to continue with PO intake, the safest would be puree and pudding-thick liquids, meds crushed in puree with oral care before and after meal. Would highly recommend for the study to be repeated as the pt's hiccups appeared to increase backflow from the esophagus; additionally pt seemed more lethargic compared to visit this morning. Will continue to follow.  Swallow Evaluation Recommendations      SLP Diet Recommendations: NPO vs Dysphagia 1  (Puree) solids;Pudding thick liquid      Medication Administration: Crushed with puree   Supervision: Full assist for feeding;Full supervision/cueing for compensatory strategies   Compensations: Minimize environmental distractions;Slow rate;Small sips/bites   Postural Changes: Remain semi-upright after after feeds/meals (Comment);Seated upright at 90 degrees   Oral Care Recommendations: Oral care before and after PO       Amy Dionicia Abler, MA, CCC-SLP 05/10/2017,1:37 PM [phone]   Electronically signed by Kern Reap, CCC-SLP at 05/10/2017 1:45 PM Electronically signed by Kern Reap, CCC-SLP at 05/10/2017 1:48 PM      Microbiology: Recent Results (from the past 240 hour(s))  Blood Culture (routine x 2)     Status: None   Collection Time: 05/06/17  5:56 PM  Result Value Ref Range Status   Specimen Description BLOOD RIGHT HAND  Final   Special Requests   Final    BOTTLES DRAWN AEROBIC AND ANAEROBIC Blood Culture adequate volume   Culture NO GROWTH 5 DAYS  Final   Report Status 05/11/2017 FINAL  Final  Blood Culture (routine x 2)     Status: None   Collection Time: 05/06/17  6:00 PM  Result Value Ref Range Status   Specimen Description BLOOD LEFT ARM  Final   Special Requests   Final    BOTTLES DRAWN AEROBIC AND ANAEROBIC Blood Culture adequate volume   Culture NO GROWTH  5 DAYS  Final   Report Status 05/11/2017 FINAL  Final  MRSA PCR Screening     Status: None   Collection Time: 05/06/17 11:10 PM  Result Value Ref Range Status   MRSA by PCR NEGATIVE NEGATIVE Final    Comment:        The GeneXpert MRSA Assay (FDA approved for NASAL specimens only), is one component of a comprehensive MRSA colonization surveillance program. It is not intended to diagnose MRSA infection nor to guide or monitor treatment for MRSA infections.      Labs: Basic Metabolic Panel:  Recent Labs Lab 05/06/17 1756 05/07/17 0300 05/08/17 0555 05/09/17 0442  NA 137 139 138 138  K 3.7 3.7 3.9  3.9  CL 104 106 102 104  CO2 27 26 27 26   GLUCOSE 112* 89 93 90  BUN 19 14 13 12   CREATININE 0.75 0.88 1.08 1.06  CALCIUM 9.1 8.7* 8.9 8.9  MG  --   --  1.8  --    Liver Function Tests:  Recent Labs Lab 05/06/17 1756  AST 44*  ALT 64*  ALKPHOS 82  BILITOT 0.6  PROT 6.6  ALBUMIN 3.3*    Recent Labs Lab 05/06/17 1756  LIPASE 25   No results for input(s): AMMONIA in the last 168 hours. CBC:  Recent Labs Lab 05/06/17 1756 05/07/17 0300 05/09/17 0531  WBC 6.4 5.3 8.9  NEUTROABS 5.0  --   --   HGB 12.9* 11.8* 12.4*  HCT 37.6* 34.7* 37.6*  MCV 91.5 91.6 93.3  PLT 269 243 251   Cardiac Enzymes: No results for input(s): CKTOTAL, CKMB, CKMBINDEX, TROPONINI in the last 168 hours. BNP: Invalid input(s): POCBNP CBG: No results for input(s): GLUCAP in the last 168 hours.  Time coordinating discharge:  Greater than 30 minutes  Signed:  Urian Martenson, DO [facility] Pager: [phone] 05/11/2017, 11:09 AM

## 2017-05-11 NOTE — Progress Notes (Signed)
The clinical social worker spoke with [facility] staff about the patient return today.  However, the [facility] MD does not want to accept the patient because he was in the ICU this AM.  I explained that he was to be discharged anyway from the ICU, but that a more critical patient needed the ICU bed.  [facility] still insists that the patient cannot come back today. Dr. Carles Collet is aware of the situation. Will keep patient for tonight and send tomorrow.

## 2017-05-11 NOTE — Progress Notes (Signed)
Patient resting in bed. Speech therapist at bedside performing an evaluation. IV patent. Report called to L.Chyrl Civatte, Therapist, sports. Patient to be transferred to room 327 via bed, accompanied by nursing staff.

## 2017-05-12 DIAGNOSIS — G9341 Metabolic encephalopathy: Secondary | ICD-10-CM

## 2017-05-12 LAB — URINALYSIS, COMPLETE (UACMP) WITH MICROSCOPIC
BILIRUBIN URINE: NEGATIVE
Bacteria, UA: NONE SEEN
Glucose, UA: NEGATIVE mg/dL
HGB URINE DIPSTICK: NEGATIVE
Ketones, ur: NEGATIVE mg/dL
LEUKOCYTES UA: NEGATIVE
NITRITE: NEGATIVE
PH: 7 (ref 5.0–8.0)
Protein, ur: NEGATIVE mg/dL
SPECIFIC GRAVITY, URINE: 1.011 (ref 1.005–1.030)

## 2017-05-12 LAB — AMMONIA: AMMONIA: 14 umol/L (ref 9–35)

## 2017-05-12 LAB — COMPREHENSIVE METABOLIC PANEL
ALBUMIN: 3 g/dL — AB (ref 3.5–5.0)
ALT: 36 U/L (ref 17–63)
AST: 20 U/L (ref 15–41)
Alkaline Phosphatase: 67 U/L (ref 38–126)
Anion gap: 7 (ref 5–15)
BUN: 13 mg/dL (ref 6–20)
CHLORIDE: 110 mmol/L (ref 101–111)
CO2: 26 mmol/L (ref 22–32)
Calcium: 9 mg/dL (ref 8.9–10.3)
Creatinine, Ser: 1.02 mg/dL (ref 0.61–1.24)
GFR calc Af Amer: >60 mL/min (ref >60)
Glucose, Bld: 101 mg/dL — ABNORMAL HIGH (ref 65–99)
POTASSIUM: 3.6 mmol/L (ref 3.5–5.1)
SODIUM: 143 mmol/L (ref 135–145)
Total Bilirubin: 1 mg/dL (ref 0.3–1.2)
Total Protein: 6.3 g/dL — ABNORMAL LOW (ref 6.5–8.1)

## 2017-05-12 LAB — CBC
HEMATOCRIT: 35.9 % — AB (ref 39.0–52.0)
Hemoglobin: 12 g/dL — ABNORMAL LOW (ref 13.0–17.0)
MCH: 31 pg (ref 26.0–34.0)
MCHC: 33.4 g/dL (ref 30.0–36.0)
MCV: 92.8 fL (ref 78.0–100.0)
Platelets: 242 10*3/uL (ref 150–400)
RBC: 3.87 MIL/uL — ABNORMAL LOW (ref 4.22–5.81)
RDW: 15.4 % (ref 11.5–15.5)
WBC: 12.8 10*3/uL — AB (ref 4.0–10.5)

## 2017-05-12 LAB — LACTIC ACID, PLASMA: Lactic Acid, Venous: 1 mmol/L (ref 0.5–1.9)

## 2017-05-12 LAB — LIPASE, BLOOD: Lipase: 28 U/L (ref 11–51)

## 2017-05-12 MED ORDER — VITAMIN B-1 100 MG PO TABS: 100.0000 mg | ORAL_TABLET | Freq: Every day | ORAL | Status: DC

## 2017-05-12 MED ORDER — THIAMINE HCL 100 MG/ML IJ SOLN
INTRAMUSCULAR | Status: AC
  Filled 2017-05-12: qty 6

## 2017-05-12 MED ORDER — VITAMIN B-1 100 MG PO TABS
100.0000 mg | ORAL_TABLET | Freq: Every day | ORAL | Status: DC
  Administered 2017-05-13 – 2017-05-14 (×2): 100 mg via ORAL
  Filled 2017-05-12 (×2): qty 1

## 2017-05-12 MED ORDER — THIAMINE HCL 100 MG/ML IJ SOLN
500.0000 mg | Freq: Once | INTRAVENOUS | Status: AC
  Administered 2017-05-12: 500 mg via INTRAVENOUS
  Filled 2017-05-12: qty 5

## 2017-05-12 NOTE — Progress Notes (Signed)
PROGRESS NOTE  [name] ZCH:[id number] DOB: 1932-03-04 DOA: 05/06/2017 PCP: Dione Housekeeper, MD  Brief History:  83 year old male with history of remote prostate cancer, dementia, glaucoma, and recent admission 6/23-28 4 stroke who was discharged to a skilled nursing facility. The patient was transferred back to the hospital after he was found to be throwing up blood and developing hypothermia. In the emergency department, he had no further emesis. His initial temperature was 40F. He was placed on a bear hugger and started on vancomycin and Zosyn for possible sepsis. MRI demonstrates a possible subacute extension of a stroke seen during his previous hospitalization. His white blood cell count has been normal. Even though his chest x-ray was unremarkable, he has a coarse cough suggestive of pneumonia or aspiration. His urinalysis was unremarkable. Blood cultures were obtained and are pending. TSH was within normal limits and Cortrosyn stimulation test was normal.  Assessment/Plan: Hypothermia, likely due to underlying infection (suspect aspiration pneumonia)versus extension of stroke - Discontinue vancomycin as MRSA PCR is negative - blood cultures--neg to date - UA--neg for pyuria - PCT <0.10 - 05/10/2017 Cortrosyn stimulation test normal - Continue Bear hugger as needed to keep temperature within normal limits - MRI demonstrated a possible extension of one of the strokes that was seen during his previous hospitalization - SLP evaluation--recommended modified barium swallow--> ultimately recommended dysphagia 1 diet with pudding thickened liquids however modified barium swallow may have been compromised by the patient's hiccups - Repeat CXR(after hydration)--neg - Had rectal temp 94.67F--early a.m. 05/12/2017 after nearly 72 hours without hypothermia-->  -Repeat cultures -Check thiamine level -ammonia -repeat CXR  Acute metabolic encephalopathy -Likely related to  hypothermia as the patient's mental status worsens with his temperature -more confused am 05/12/17 -TSH 3.434 -Check ammonia -RPR -Urinalysis -Serum B12 -Placed on telemetry  Hematemesis - None observed since admission--hemoglobin remained stable throughout the hospitalization - BID PPI  Subacute strokes with extension of recent stroke -05/07/2017 MRI brain subacute right lateral medullary infarct with increased extension, no new stroke. -Extension of stroke likely due to relative hypotension - PT/OT/SLP reevaluations-->SNF - Continue daily aspirin - LDL was 66, A1c 5.4, ECHO with preserved EF of 60-65%, carotid duplex with 1-39% stenosis a week ago - Per Neurology, due to amyloid angiopathy and dementia, patient is not a good candidate for anticoagulation and they did not pursue further work up during his previous hospitalization -Cased was discussed with neurology, Dr. Sherin Quarry need for transfer or further workup - F/u with Dr. Leonie Man as outpatient  Bradycardia, chronic,  - may be side effect ofAricept -  exacerbated by hypothermia -No AV blocks or pauses on telemetry--personally reviewed tele  Dementia/Goals of Care -confusion worse after recent CVAs, although patient had apparently progressive disease prior -DNR status confirmed with family -palliative care meeting 7/6-->plan to d/c to SNF with palliative services -case discussed with palliative medicine team    Disposition Plan: SNF with palliative services when stable Family Communication: Family updated at bedside 05/11/17  Consultants: palliative medicine  Code Status: DNR, palliative medicine  DVT Prophylaxis: [place] Lovenox   Procedures: As Listed in Progress Note Above  Antibiotics: Zosyn 7/2>>>7/6 vanco 7/2>>>7/3           Subjective: Patient is presently confused. He complains of pain all over. Denies any fevers, chills, chest breath. Remainder of review of systems  is unobtainable secondary to patient's encephalopathy.  Objective: Vitals:   05/12/17 2878 05/12/17 0555 05/12/17 0825 05/12/17  1035  BP:  (!) 189/81    Pulse:  (!) 46    Resp:  18    Temp: (!) 94.7 F (34.8 C) (!) 95.6 F (35.3 C) (!) 97.4 F (36.3 C) 100 F (37.8 C)  TempSrc: Rectal Rectal Rectal Rectal  SpO2:  100%    Weight:      Height:        Intake/Output Summary (Last 24 hours) at 05/12/17 1214 Last data filed at 05/12/17 0800  Gross per 24 hour  Intake              240 ml  Output                0 ml  Net              240 ml   Weight change:  Exam:   General:  Pt is alert, but confused, does not follow commands appropriately, not in acute distress  HEENT: No icterus, No thrush, No neck mass, [place]/AT, no meningismus  Cardiovascular: RRR, S1/S2, no rubs, no gallops  Respiratory: Bibasilar rales, no wheezing  Abdomen: Soft/+BS, non tender, non distended, no guarding  Extremities: No edema, No lymphangitis, No petechiae, No rashes, no synovitis   Data Reviewed: I have personally reviewed following labs and imaging studies Basic Metabolic Panel:  Recent Labs Lab 05/06/17 1756 05/07/17 0300 05/08/17 0555 05/09/17 0442  NA 137 139 138 138  K 3.7 3.7 3.9 3.9  CL 104 106 102 104  CO2 27 26 27 26   GLUCOSE 112* 89 93 90  BUN 19 14 13 12   CREATININE 0.75 0.88 1.08 1.06  CALCIUM 9.1 8.7* 8.9 8.9  MG  --   --  1.8  --    Liver Function Tests:  Recent Labs Lab 05/06/17 1756  AST 44*  ALT 64*  ALKPHOS 82  BILITOT 0.6  PROT 6.6  ALBUMIN 3.3*    Recent Labs Lab 05/06/17 1756  LIPASE 25   No results for input(s): AMMONIA in the last 168 hours. Coagulation Profile:  Recent Labs Lab 05/06/17 2117  INR 0.91   CBC:  Recent Labs Lab 05/06/17 1756 05/07/17 0300 05/09/17 0531  WBC 6.4 5.3 8.9  NEUTROABS 5.0  --   --   HGB 12.9* 11.8* 12.4*  HCT 37.6* 34.7* 37.6*  MCV 91.5 91.6 93.3  PLT 269 243 251   Cardiac Enzymes: No results for  input(s): CKTOTAL, CKMB, CKMBINDEX, TROPONINI in the last 168 hours. BNP: Invalid input(s): POCBNP CBG: No results for input(s): GLUCAP in the last 168 hours. HbA1C: No results for input(s): HGBA1C in the last 72 hours. Urine analysis:    Component Value Date/Time   COLORURINE STRAW (A) 05/06/2017 1750   APPEARANCEUR CLEAR 05/06/2017 1750   LABSPEC 1.012 05/06/2017 1750   PHURINE 6.0 05/06/2017 1750   GLUCOSEU NEGATIVE 05/06/2017 1750   HGBUR NEGATIVE 05/06/2017 1750   BILIRUBINUR NEGATIVE 05/06/2017 1750   KETONESUR NEGATIVE 05/06/2017 1750   PROTEINUR NEGATIVE 05/06/2017 1750   UROBILINOGEN 1.0 12/22/2007 1456   NITRITE NEGATIVE 05/06/2017 1750   LEUKOCYTESUR NEGATIVE 05/06/2017 1750   Sepsis Labs: @LABRCNTIP (procalcitonin:4,lacticidven:4) ) Recent Results (from the past 240 hour(s))  Blood Culture (routine x 2)     Status: None   Collection Time: 05/06/17  5:56 PM  Result Value Ref Range Status   Specimen Description BLOOD RIGHT HAND  Final   Special Requests   Final    BOTTLES DRAWN AEROBIC AND ANAEROBIC Blood Culture  adequate volume   Culture NO GROWTH 5 DAYS  Final   Report Status 05/11/2017 FINAL  Final  Blood Culture (routine x 2)     Status: None   Collection Time: 05/06/17  6:00 PM  Result Value Ref Range Status   Specimen Description BLOOD LEFT ARM  Final   Special Requests   Final    BOTTLES DRAWN AEROBIC AND ANAEROBIC Blood Culture adequate volume   Culture NO GROWTH 5 DAYS  Final   Report Status 05/11/2017 FINAL  Final  MRSA PCR Screening     Status: None   Collection Time: 05/06/17 11:10 PM  Result Value Ref Range Status   MRSA by PCR NEGATIVE NEGATIVE Final    Comment:        The GeneXpert MRSA Assay (FDA approved for NASAL specimens only), is one component of a comprehensive MRSA colonization surveillance program. It is not intended to diagnose MRSA infection nor to guide or monitor treatment for MRSA infections.      Scheduled Meds: .  aspirin  325 mg Oral Daily  . chlorhexidine  15 mL Mouth Rinse BID  . docusate sodium  100 mg Oral BID  . donepezil  10 mg Oral QHS  . enoxaparin (LOVENOX) injection  40 mg Subcutaneous Q24H  . latanoprost  1 drop Both Eyes QHS  . mouth rinse  15 mL Mouth Rinse q12n4p  . memantine  10 mg Oral BID  . pantoprazole  40 mg Oral BID AC   Continuous Infusions:  Procedures/Studies: Ct Angio Head W Or Wo Contrast  Result Date: 04/30/2017 CLINICAL DATA:  Acute encephalopathy and syncopal episode. Multiple small acute brainstem and posterior fossa infarcts. EXAM: CT ANGIOGRAPHY HEAD AND NECK TECHNIQUE: Multidetector CT imaging of the head and neck was performed using the standard protocol during bolus administration of intravenous contrast. Multiplanar CT image reconstructions and MIPs were obtained to evaluate the vascular anatomy. Carotid stenosis measurements (when applicable) are obtained utilizing NASCET criteria, using the distal internal carotid diameter as the denominator. CONTRAST:  50 mL Isovue 370 COMPARISON:  Brain MRI 04/28/2017 FINDINGS: CT HEAD FINDINGS Brain: No hemorrhage or mass effect. There is faint hypoattenuation at the sites of diffusion restriction described on recent MRI. There is periventricular hypoattenuation compatible with chronic microvascular disease. Vascular: No hyperdense vessel or unexpected calcification. Skull: Normal visualized skull base, calvarium and extracranial soft tissues. Sinuses/Orbits: No sinus fluid levels or advanced mucosal thickening. No mastoid effusion. Normal orbits. CTA NECK FINDINGS Aortic arch: There is no aneurysm or dissection of the visualized ascending aorta or aortic arch. There is a normal 3 vessel branching pattern. The visualized proximal subclavian arteries are normal. Right carotid system: The right common carotid origin is widely patent. There is no common carotid or internal carotid artery dissection or aneurysm. No hemodynamically  significant stenosis. Left carotid system: The left common carotid origin is widely patent. There is no common carotid or internal carotid artery dissection or aneurysm. No hemodynamically significant stenosis. Vertebral arteries: The vertebral system is left dominant. There is calcification in the left P1 segment. The right vertebral artery is diminutive along its entire course. There is no flow demonstrated in the right V4 segment. Skeleton: There is no bony spinal canal stenosis. No lytic or blastic lesions. Other neck: The nasopharynx is clear. The oropharynx and hypopharynx are normal. The epiglottis is normal. The supraglottic larynx, glottis and subglottic larynx are normal. No retropharyngeal collection. The parapharyngeal spaces are preserved. The parotid and submandibular glands are  normal. No sialolithiasis or salivary ductal dilatation. The thyroid gland is normal. There is no cervical lymphadenopathy. Upper chest: There is ground-glass opacity in the posterior right upper lobe. Small bilateral pleural effusions with associated atelectasis. Review of the MIP images confirms the above findings CTA HEAD FINDINGS Anterior circulation: --Intracranial internal carotid arteries: Normal. --Anterior cerebral arteries: Normal. --Middle cerebral arteries: Normal. --Posterior communicating arteries: Present on the right. Posterior circulation: --Posterior cerebral arteries: Normal. --Superior cerebellar arteries: Normal. --Basilar artery: Normal. --Anterior inferior cerebellar arteries: Normal. --Posterior inferior cerebellar arteries: Left dominant. Venous sinuses: As permitted by contrast timing, patent. Anatomic variants: None Delayed phase: No parenchymal contrast enhancement. Review of the MIP images confirms the above findings IMPRESSION: 1. No emergent large vessel occlusion. 2. No hemodynamically significant stenosis of the major cervical arteries. 3. Congenitally diminutive right vertebral artery.  Electronically Signed   By: Ulyses Jarred M.D.   On: 04/30/2017 21:22   Dg Chest 1 View  Result Date: 04/27/2017 CLINICAL DATA:  Found unresponsive. EXAM: CHEST 1 VIEW COMPARISON:  Chest x-ray dated 12/25/2007. FINDINGS: Heart size is upper normal. Overall cardiomediastinal silhouette is stable in size and configuration. Lungs are clear. No pleural effusion or pneumothorax seen. Osseous and soft tissue structures about the chest are unremarkable. IMPRESSION: No active disease. Electronically Signed   By: Franki Cabot M.D.   On: 04/27/2017 20:50   Ct Head Wo Contrast  Result Date: 05/06/2017 CLINICAL DATA:  Hypothermia.  Altered mental status. EXAM: CT HEAD WITHOUT CONTRAST TECHNIQUE: Contiguous axial images were obtained from the base of the skull through the vertex without intravenous contrast. COMPARISON:  Head CT 04/30/2017, 04/27/2017.  Brain MRI 04/28/2017 FINDINGS: Brain: No acute intracranial hemorrhage. Small acute infarcts on prior MRI are not well seen by CT. Stable degree of atrophy and chronic small vessel ischemia. Remote lacunar infarcts in the right thalamus. No extra-axial fluid collection. Vascular: Atherosclerosis of skullbase vasculature without hyperdense vessel or abnormal calcification. Skull: No fracture or focal lesion. Sinuses/Orbits: Paranasal sinuses and mastoid air cells are clear. The visualized orbits are unremarkable. Bilateral cataract resection. Other: None. IMPRESSION: No acute abnormality. Stable degree of atrophy and chronic small vessel ischemia. Recent small infarcts on MRI are not well seen by CT. Electronically Signed   By: Jeb Levering M.D.   On: 05/06/2017 20:36   Ct Head Wo Contrast  Result Date: 04/27/2017 CLINICAL DATA:  Found unresponsive earlier today. Low blood pressure. Partial resuscitation. EXAM: CT HEAD WITHOUT CONTRAST TECHNIQUE: Contiguous axial images were obtained from the base of the skull through the vertex without intravenous contrast.  COMPARISON:  03/30/2015 brain MR. FINDINGS: Brain: No evidence for acute stroke, acute hemorrhage, mass lesion, or extra-axial fluid. There is extreme atrophy, with hydrocephalus ex vacuo. Extensive involvement of the white matter by hypoattenuation, representing chronic microvascular ischemic change. There is an old RIGHT thalamus lacunar infarct. Vascular: No hyperdense vessel or unexpected calcification. Skull: Normal. Negative for fracture or focal lesion. Sinuses/Orbits: No acute findings or layering fluid. BILATERAL cataract extraction. Other: None.  Compared with prior brain MR, similar appearance. IMPRESSION: Severe atrophy and extensive white matter disease. Evidence for chronic cerebral infarction. No acute intracranial findings are evident. Electronically Signed   By: Staci Righter M.D.   On: 04/27/2017 20:22   Ct Angio Neck W Or Wo Contrast  Result Date: 04/30/2017 CLINICAL DATA:  Acute encephalopathy and syncopal episode. Multiple small acute brainstem and posterior fossa infarcts. EXAM: CT ANGIOGRAPHY HEAD AND NECK TECHNIQUE: Multidetector CT imaging of  the head and neck was performed using the standard protocol during bolus administration of intravenous contrast. Multiplanar CT image reconstructions and MIPs were obtained to evaluate the vascular anatomy. Carotid stenosis measurements (when applicable) are obtained utilizing NASCET criteria, using the distal internal carotid diameter as the denominator. CONTRAST:  50 mL Isovue 370 COMPARISON:  Brain MRI 04/28/2017 FINDINGS: CT HEAD FINDINGS Brain: No hemorrhage or mass effect. There is faint hypoattenuation at the sites of diffusion restriction described on recent MRI. There is periventricular hypoattenuation compatible with chronic microvascular disease. Vascular: No hyperdense vessel or unexpected calcification. Skull: Normal visualized skull base, calvarium and extracranial soft tissues. Sinuses/Orbits: No sinus fluid levels or advanced mucosal  thickening. No mastoid effusion. Normal orbits. CTA NECK FINDINGS Aortic arch: There is no aneurysm or dissection of the visualized ascending aorta or aortic arch. There is a normal 3 vessel branching pattern. The visualized proximal subclavian arteries are normal. Right carotid system: The right common carotid origin is widely patent. There is no common carotid or internal carotid artery dissection or aneurysm. No hemodynamically significant stenosis. Left carotid system: The left common carotid origin is widely patent. There is no common carotid or internal carotid artery dissection or aneurysm. No hemodynamically significant stenosis. Vertebral arteries: The vertebral system is left dominant. There is calcification in the left P1 segment. The right vertebral artery is diminutive along its entire course. There is no flow demonstrated in the right V4 segment. Skeleton: There is no bony spinal canal stenosis. No lytic or blastic lesions. Other neck: The nasopharynx is clear. The oropharynx and hypopharynx are normal. The epiglottis is normal. The supraglottic larynx, glottis and subglottic larynx are normal. No retropharyngeal collection. The parapharyngeal spaces are preserved. The parotid and submandibular glands are normal. No sialolithiasis or salivary ductal dilatation. The thyroid gland is normal. There is no cervical lymphadenopathy. Upper chest: There is ground-glass opacity in the posterior right upper lobe. Small bilateral pleural effusions with associated atelectasis. Review of the MIP images confirms the above findings CTA HEAD FINDINGS Anterior circulation: --Intracranial internal carotid arteries: Normal. --Anterior cerebral arteries: Normal. --Middle cerebral arteries: Normal. --Posterior communicating arteries: Present on the right. Posterior circulation: --Posterior cerebral arteries: Normal. --Superior cerebellar arteries: Normal. --Basilar artery: Normal. --Anterior inferior cerebellar arteries:  Normal. --Posterior inferior cerebellar arteries: Left dominant. Venous sinuses: As permitted by contrast timing, patent. Anatomic variants: None Delayed phase: No parenchymal contrast enhancement. Review of the MIP images confirms the above findings IMPRESSION: 1. No emergent large vessel occlusion. 2. No hemodynamically significant stenosis of the major cervical arteries. 3. Congenitally diminutive right vertebral artery. Electronically Signed   By: Ulyses Jarred M.D.   On: 04/30/2017 21:22   Mr Jodene Nam Head Wo Contrast  Result Date: 05/07/2017 CLINICAL DATA:  Hypothermia. Altered mental status. History of dementia. EXAM: MRI HEAD WITHOUT CONTRAST MRA HEAD WITHOUT CONTRAST TECHNIQUE: Multiplanar, multiecho pulse sequences of the brain and surrounding structures were obtained without intravenous contrast. Angiographic images of the head were obtained using MRA technique without contrast. COMPARISON:  04/28/2017 FINDINGS: MRI HEAD FINDINGS Brain: Subacute right lateral medullary infarct appears more extensive than on prior. A subacute left cerebellar infarct on the previous study has decreased in restriction intensity. Cerebral cortical infarcts seen previously are no longer visualized. There are numerous small remote bilateral cerebellar infarcts. Remote lacunar infarct in the right thalamus. Atrophy with ventriculomegaly, especially of the temporal and occipital lobes. There is chronic confluent FLAIR hyperintensity in the cerebral white matter with remote lacunes in the bilateral  centrum semiovale. The corpus callosum is thinned the setting of cerebral volume loss. Numerous foci of chronic lobar microhemorrhage. Vascular: Arterial findings below. Normal dural venous sinus flow voids. Skull and upper cervical spine: Negative for marrow lesion. Sinuses/Orbits: No acute finding.  Bilateral cataract resection. MRA HEAD FINDINGS Solitary visible left vertebral artery, stable from CTA 04/28/2017. The right vertebral  artery is hypoplastic and may be occluded at the V4 segment. Mild proximal basilar atheromatous type narrowing. The right P1 segment is hypoplastic. Right P2 segment is attenuated and appears diffusely irregular, apparently new from prior but potentially artifactual on this motion degraded study. No reversible flow limiting stenosis in the anterior circulation. There is moderate irregularity of bilateral MCA branches, again potentially related to artifact rather than atherosclerosis. IMPRESSION: 1. Subacute right lateral medullary infarct with questionable increased extent compared to 04/28/2017 MRI. Subacute infarcts in the left cerebellum and bilateral cerebral cortex are fading. No new site of ischemia; no hemorrhagic conversion. 2. Motion degraded MRA which may account for apparent newly narrowed bilateral M2 and right P2 segments when compared to CTA 04/30/2017. 3. Advanced atrophy and chronic white matter disease. Remote lobar micro hemorrhages may reflect underlying amyloid angiography. Electronically Signed   By: Monte Fantasia M.D.   On: 05/07/2017 10:31   Mr Brain Wo Contrast  Result Date: 05/07/2017 CLINICAL DATA:  Hypothermia. Altered mental status. History of dementia. EXAM: MRI HEAD WITHOUT CONTRAST MRA HEAD WITHOUT CONTRAST TECHNIQUE: Multiplanar, multiecho pulse sequences of the brain and surrounding structures were obtained without intravenous contrast. Angiographic images of the head were obtained using MRA technique without contrast. COMPARISON:  04/28/2017 FINDINGS: MRI HEAD FINDINGS Brain: Subacute right lateral medullary infarct appears more extensive than on prior. A subacute left cerebellar infarct on the previous study has decreased in restriction intensity. Cerebral cortical infarcts seen previously are no longer visualized. There are numerous small remote bilateral cerebellar infarcts. Remote lacunar infarct in the right thalamus. Atrophy with ventriculomegaly, especially of the  temporal and occipital lobes. There is chronic confluent FLAIR hyperintensity in the cerebral white matter with remote lacunes in the bilateral centrum semiovale. The corpus callosum is thinned the setting of cerebral volume loss. Numerous foci of chronic lobar microhemorrhage. Vascular: Arterial findings below. Normal dural venous sinus flow voids. Skull and upper cervical spine: Negative for marrow lesion. Sinuses/Orbits: No acute finding.  Bilateral cataract resection. MRA HEAD FINDINGS Solitary visible left vertebral artery, stable from CTA 04/28/2017. The right vertebral artery is hypoplastic and may be occluded at the V4 segment. Mild proximal basilar atheromatous type narrowing. The right P1 segment is hypoplastic. Right P2 segment is attenuated and appears diffusely irregular, apparently new from prior but potentially artifactual on this motion degraded study. No reversible flow limiting stenosis in the anterior circulation. There is moderate irregularity of bilateral MCA branches, again potentially related to artifact rather than atherosclerosis. IMPRESSION: 1. Subacute right lateral medullary infarct with questionable increased extent compared to 04/28/2017 MRI. Subacute infarcts in the left cerebellum and bilateral cerebral cortex are fading. No new site of ischemia; no hemorrhagic conversion. 2. Motion degraded MRA which may account for apparent newly narrowed bilateral M2 and right P2 segments when compared to CTA 04/30/2017. 3. Advanced atrophy and chronic white matter disease. Remote lobar micro hemorrhages may reflect underlying amyloid angiography. Electronically Signed   By: Monte Fantasia M.D.   On: 05/07/2017 10:31   Mr Brain Wo Contrast  Result Date: 04/28/2017 CLINICAL DATA:  Found unresponsive. Syncope and collapse. Prior stroke. EXAM:  MRI HEAD WITHOUT CONTRAST TECHNIQUE: Multiplanar, multiecho pulse sequences of the brain and surrounding structures were obtained without intravenous  contrast. COMPARISON:  Head CT 04/27/2017 and MRI 03/30/2015 FINDINGS: Brain: Small acute infarcts are present in the right medulla and posteroinferior left cerebellar hemisphere. There are also punctate acute cortical infarcts in the high posterior frontal lobes bilaterally. Scattered chronic cerebral and cerebellar microhemorrhages are again seen. A small chronic right occipital cortical infarct is new or larger than on the prior MRI. There also may be a tiny left occipital chronic cortical infarct. Patchy to confluent cerebral white matter T2 hyperintensities are similar to the prior MRI and nonspecific but compatible with extensive chronic small vessel ischemic disease. There are numerous small chronic infarcts in the cerebellum bilaterally. Chronic lacunar infarcts are also again seen in the anterior left centrum semiovale and right thalamus. There is marked cerebral atrophy. No mass, midline shift, or extra-axial fluid collection is seen. Vascular: Unchanged poor visualization of the distal right vertebral artery, possibly occluded. Dominant appearing left vertebral artery with grossly preserved vascular flow voids elsewhere. Skull and upper cervical spine: Unremarkable bone marrow signal. Sinuses/Orbits: Bilateral cataract extraction. Paranasal sinuses and mastoid air cells are clear. Other: None. IMPRESSION: 1. 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 2. Advanced cerebral atrophy and chronic small vessel ischemic disease with chronic infarcts as above. Electronically Signed   By: Logan Bores M.D.   On: 04/28/2017 12:13   US Carotid Bilateral (at [facility] Only)  Result Date: 04/29/2017 CLINICAL DATA:  Stroke.  Syncopal episode. EXAM: BILATERAL CAROTID DUPLEX ULTRASOUND TECHNIQUE: Pearline Cables scale imaging, color Doppler and duplex ultrasound were performed of bilateral carotid and vertebral arteries in the neck. COMPARISON:  None. FINDINGS: Criteria: Quantification  of carotid stenosis is based on velocity parameters that correlate the residual internal carotid diameter with NASCET-based stenosis levels, using the diameter of the distal internal carotid lumen as the denominator for stenosis measurement. The following velocity measurements were obtained: RIGHT ICA:  45/6 cm/sec CCA:  932/35 cm/sec SYSTOLIC ICA/CCA RATIO:  0.4 DIASTOLIC ICA/CCA RATIO:  0.4 ECA:  105 cm/sec LEFT ICA:  78/6 cm/sec CCA:  573/22 cm/sec SYSTOLIC ICA/CCA RATIO:  0.7 DIASTOLIC ICA/CCA RATIO:  0.6 ECA:  102 cm/sec RIGHT CAROTID ARTERY: There is a very minimal amount of intimal thickening within the right carotid bulb (image 17)), extending to involve the origin and proximal aspects of the right internal carotid artery (image 25), not resulting in elevated peak systolic velocities within the interrogated course the right internal carotid artery to suggest a hemodynamically significant stenosis. RIGHT VERTEBRAL ARTERY:  Antegrade flow LEFT CAROTID ARTERY: There is a moderate amount of eccentric mixed echogenic plaque within the left carotid bulb (images 49 and 51), not resulting in elevated peak systolic velocities within the interrogated course of the left internal carotid artery to suggest a hemodynamically significant stenosis. LEFT VERTEBRAL ARTERY:  Antegrade flow IMPRESSION: 1. Moderate amount of left-sided atherosclerotic plaque, not resulting in a hemodynamically significant stenosis. 2. Minimal amount of right-sided intimal wall thickening, not resulting in a hemodynamically significant stenosis. Electronically Signed   By: Sandi Mariscal M.D.   On: 04/29/2017 11:56   Dg Chest Port 1 View  Result Date: 05/08/2017 CLINICAL DATA:  Rhonchi.  Congestion. EXAM: PORTABLE CHEST 1 VIEW COMPARISON:  05/06/2017 FINDINGS: Upright AP view of the chest was obtained. There is no focal airspace disease or pulmonary edema. There is a skin fold overlying the lateral right  chest. Negative for a pneumothorax. Heart  and mediastinum are with normal limits and stable. No acute bone abnormality. IMPRESSION: No active disease. Electronically Signed   By: Markus Daft M.D.   On: 05/08/2017 10:58   Dg Chest Port 1 View  Result Date: 05/06/2017 CLINICAL DATA:  Hypoxia EXAM: PORTABLE CHEST 1 VIEW COMPARISON:  April 27, 2017 FINDINGS: The heart size and mediastinal contours are stable. The aorta is tortuous. Both lungs are clear. Degenerative joint changes of the shoulders are noted. IMPRESSION: No active cardiopulmonary disease. Electronically Signed   By: Abelardo Diesel M.D.   On: 05/06/2017 18:18   Dg Swallowing Func-speech Pathology  Result Date: 05/10/2017 Clinical Impression             Pt currently with a moderate- severe oropharyngeal/ cervical esophageal phase dysphagia. Oral phase of swallow is disorganized with tongue pumping, piecemeal swallowing, delayed oral transit, and premature spill to the level of the pyriform sinuses. Pt also with reduced base of tongue retraction and occasional reduced airway closure resulting in silent aspiration of nectar-thick liquids- trace with teaspoon sip, frank with straw sips; pt able to clear most of aspirated material when cued to clear throat. Pt also with decreased UES opening and moderate-severe residuals in the pharynx; pt had hiccups during the study and there was backflow from the upper esophagus into the pharynx as well. Suspect that pt aspirated some of the residuals/ backflow as pt developed a wet vocal quality during the study; pt also expectorated significant amounts of what appeared to be barium mixed with saliva throughout the study. Pt appeared to have greater success with puree bolus, likely from the increased sensory input with a heavier material which resulted in fewer residuals and no aspiration of puree material was observed. The quality of the study was also limited by the pt's positioning (head leaning on wall, shoulder obstructing view at times). Aspiration risk is  high at this time. If plan is to continue with PO intake, the safest would be puree and pudding-thick liquids, meds crushed in puree with oral care before and after meal. Would highly recommend for the study to be repeated as the pt's hiccups appeared to increase backflow from the esophagus; additionally pt seemed more lethargic compared to visit this morning. Will continue to follow.  Swallow Evaluation Recommendations      SLP Diet Recommendations: NPO vs Dysphagia 1 (Puree) solids;Pudding thick liquid      Medication Administration: Crushed with puree   Supervision: Full assist for feeding;Full supervision/cueing for compensatory strategies   Compensations: Minimize environmental distractions;Slow rate;Small sips/bites   Postural Changes: Remain semi-upright after after feeds/meals (Comment);Seated upright at 90 degrees   Oral Care Recommendations: Oral care before and after PO       Amy Dionicia Abler, MA, CCC-SLP 05/10/2017,1:37 PM [phone]   Electronically signed by Kern Reap, CCC-SLP at 05/10/2017 1:45 PM Electronically signed by Kern Reap, CCC-SLP at 05/10/2017 1:48 PM     Shruthi Northrup, DO  [facility] Pager [phone]  If 7PM-7AM, please contact night-coverage [email or web address] Password [email or web address] 05/12/2017, 12:14 PM   LOS: 5 days

## 2017-05-12 NOTE — Progress Notes (Signed)
HOB at 90 degrees after med admin due to PT coughing and aspiration risk. Medications crushed in apple sauce. Honey thickened liquids provided in small quantities to ensure medications were swallowed. Suctioned pt due to increased secretions. Continue to monitor

## 2017-05-12 NOTE — Clinical Social Work Note (Signed)
Pt's temperature dropped this AM. Pt put back in bair hugger with core temp being monitored. Discharge order removed, as pt not medically stable to discharge. CSW continuing to follow.  Oretha Ellis, [place], [place] Work Letitia Libra coverage) [phone]

## 2017-05-12 NOTE — Progress Notes (Signed)
NT unable to get a temp on patient. PT skin felt cold to touch. Pt states he felt cold. Temp in room adjusted to 80. Two rectal temps taken, the first at 94.7 and the second at 95.6. Tried axillary and oral for patient with no success. Bair hugger was placed on the patient with warming blankets and comforter. Continue to monitor core temp.

## 2017-05-13 DIAGNOSIS — G9341 Metabolic encephalopathy: Secondary | ICD-10-CM

## 2017-05-13 LAB — BLOOD CULTURE ID PANEL (REFLEXED)
Acinetobacter baumannii: NOT DETECTED
CANDIDA ALBICANS: NOT DETECTED
CANDIDA GLABRATA: NOT DETECTED
CANDIDA PARAPSILOSIS: NOT DETECTED
CANDIDA TROPICALIS: NOT DETECTED
Candida krusei: NOT DETECTED
ENTEROBACTER CLOACAE COMPLEX: NOT DETECTED
ESCHERICHIA COLI: NOT DETECTED
Enterobacteriaceae species: NOT DETECTED
Enterococcus species: NOT DETECTED
Haemophilus influenzae: NOT DETECTED
KLEBSIELLA OXYTOCA: NOT DETECTED
KLEBSIELLA PNEUMONIAE: NOT DETECTED
Listeria monocytogenes: NOT DETECTED
Methicillin resistance: DETECTED — AB
NEISSERIA MENINGITIDIS: NOT DETECTED
Proteus species: NOT DETECTED
Pseudomonas aeruginosa: NOT DETECTED
STREPTOCOCCUS PYOGENES: NOT DETECTED
STREPTOCOCCUS SPECIES: NOT DETECTED
Serratia marcescens: NOT DETECTED
Staphylococcus aureus (BCID): NOT DETECTED
Staphylococcus species: DETECTED — AB
Streptococcus agalactiae: NOT DETECTED
Streptococcus pneumoniae: NOT DETECTED

## 2017-05-13 LAB — CREATININE, SERUM
Creatinine, Ser: 1.04 mg/dL (ref 0.61–1.24)
GFR calc Af Amer: >60 mL/min (ref >60)
GFR calc non Af Amer: >60 mL/min (ref >60)

## 2017-05-13 LAB — RPR: RPR: NONREACTIVE

## 2017-05-13 LAB — VITAMIN B12: Vitamin B-12: 545 pg/mL (ref 180–914)

## 2017-05-13 MED ORDER — THIAMINE HCL 100 MG PO TABS: 100.0000 mg | ORAL_TABLET | Freq: Every day | ORAL | 0 refills | Status: DC

## 2017-05-13 MED ORDER — VANCOMYCIN HCL 10 G IV SOLR
1250.0000 mg | Freq: Once | INTRAVENOUS | Status: AC
  Administered 2017-05-13: 1250 mg via INTRAVENOUS
  Filled 2017-05-13: qty 1250

## 2017-05-13 MED ORDER — VANCOMYCIN HCL IN DEXTROSE 1-5 GM/200ML-% IV SOLN: 1000.0000 mg | INTRAVENOUS | Status: DC

## 2017-05-13 NOTE — Progress Notes (Signed)
PROGRESS NOTE  [name] CNO:[id number] DOB: 07/29/1932 DOA: 05/06/2017 PCP: Dione Housekeeper, MD  Brief History:  83 year old male with history of remote prostate cancer, dementia, glaucoma, and recent admission 6/23-28 4 stroke who was discharged to a skilled nursing facility. The patient was transferred back to the hospital after he was found to be throwing up blood and developing hypothermia. In the emergency department, he had no further emesis. His initial temperature was 53F. He was placed on a bear hugger and started on vancomycin and Zosyn for possible sepsis. MRI demonstrates a possible subacute extension of a stroke seen during his previous hospitalization. His white blood cell count has been normal. Even though his chest x-ray was unremarkable, he has a coarse cough suggestive of pneumonia or aspiration. His urinalysis was unremarkable. Blood cultures were obtained and are pending. TSH was within normal limits and Cortrosyn stimulation test was normal.  Assessment/Plan: Hypothermia, likely due to underlying infection (suspect aspiration pneumonia)versus extension of stroke - Discontinue vancomycin as MRSA PCR is negative - blood cultures--neg to date - UA--neg for pyuria - PCT <0.10 - 05/10/2017 Cortrosyn stimulation test normal - Continue Bear hugger as needed to keep temperature within normal limits - MRI demonstrated a possible extension of one of the strokes that was seen during his previous hospitalization - SLP evaluation--recommended modified barium swallow-->ultimately recommended dysphagia 1 diet with pudding thickened liquids however modified barium swallow may have been compromised by the patient's hiccups - Repeat CXR(after hydration)--neg - Had rectal temp 94.5F--early a.m. 05/12/2017 after nearly 72 hours without hypothermia-->  -Repeat cultures--CoNS 1 of 2 -Check thiamine level -QHUTMLY--65  Acute metabolic encephalopathy -Likely  related to hypothermia as the patient's mental status worsens with his temperature -more confused am 05/12/17 -TSH 3.434 -Check ammonia--14 -RPR--NR -Urinalysis--neg for pyuria -Serum B12--545 -Placed on telemetry -mental status improves when temp is improved  Bacteremia -Likely contaminant -BCID--CoNS 1 of 2  -d/c vancomycin  Hematemesis - None observed since admission--hemoglobin remained stablethroughout the hospitalization - BID PPI  Subacute strokes with extension of recent stroke -05/07/2017 MRI brain subacute right lateral medullary infarct with increased extension, no new stroke. -Extension of stroke likely due to relative hypotension - PT/OT/SLP reevaluations-->SNF - Continue daily aspirin - LDL was 66, A1c 5.4, ECHO with preserved EF of 60-65%, carotid duplex with 1-39% stenosis a week ago - Per Neurology, due to amyloid angiopathy and dementia, patient is not a good candidate for anticoagulation and they did not pursue further work up during his previous hospitalization -Cased was discussed with neurology, Dr. Sherin Quarry need for transfer or further workup - F/u with Dr. Leonie Man as outpatient  Bradycardia, chronic,  - may be side effect ofAricept -  exacerbated by hypothermia -No AV blocks or pauses on telemetry--personally reviewed tele  Dementia/Goals of Care -confusion worse after recent CVAs, although patient had apparently progressive disease prior -DNR status confirmed with family -palliative care meeting 7/6-->plan to d/c to SNF with palliative services -case discussed with palliative medicine team    Disposition Plan: SNF with palliative services 7/10 if stable Family Communication: Brother updated at bedside 05/13/17  Consultants: palliative medicine  Code Status: DNR, palliative medicine  DVT Prophylaxis: Craig Lovenox   Procedures: As Listed in Progress Note Above  Antibiotics: Zosyn 7/2>>>7/6 vanco  7/2>>>7/3    Subjective: Patient denies fevers, chills, headache, chest pain, dyspnea, nausea, vomiting, diarrhea, abdominal pain, dysuria, hematuria, hematochezia, and melena.   Objective: Vitals:   05/12/17 2130  05/13/17 0049 05/13/17 0551 05/13/17 1417  BP: 138/70  123/60 136/71  Pulse: 67  66 (!) 58  Resp: 16  16 18   Temp: 99.3 F (37.4 C) (!) 97.5 F (36.4 C) 99.5 F (37.5 C) 98.1 F (36.7 C)  TempSrc: Oral Oral Oral Axillary  SpO2: 100%  98% 99%  Weight:      Height:        Intake/Output Summary (Last 24 hours) at 05/13/17 1612 Last data filed at 05/13/17 1403  Gross per 24 hour  Intake              566 ml  Output              800 ml  Net             -234 ml   Weight change:  Exam:   General:  Pt is alert, follows commands appropriately, not in acute distress  HEENT: No icterus, No thrush, No neck mass, [place]/AT  Cardiovascular: RRR, S1/S2, no rubs, no gallops  Respiratory: CTA bilaterally, no wheezing, no crackles, no rhonchi  Abdomen: Soft/+BS, non tender, non distended, no guarding  Extremities: No edema, No lymphangitis, No petechiae, No rashes, no synovitis   Data Reviewed: I have personally reviewed following labs and imaging studies Basic Metabolic Panel:  Recent Labs Lab 05/06/17 1756 05/07/17 0300 05/08/17 0555 05/09/17 0442 05/12/17 1226 05/13/17 0730  NA 137 139 138 138 143  --   K 3.7 3.7 3.9 3.9 3.6  --   CL 104 106 102 104 110  --   CO2 27 26 27 26 26   --   GLUCOSE 112* 89 93 90 101*  --   BUN 19 14 13 12 13   --   CREATININE 0.75 0.88 1.08 1.06 1.02 1.04  CALCIUM 9.1 8.7* 8.9 8.9 9.0  --   MG  --   --  1.8  --   --   --    Liver Function Tests:  Recent Labs Lab 05/06/17 1756 05/12/17 1226  AST 44* 20  ALT 64* 36  ALKPHOS 82 67  BILITOT 0.6 1.0  PROT 6.6 6.3*  ALBUMIN 3.3* 3.0*    Recent Labs Lab 05/06/17 1756 05/12/17 1226  LIPASE 25 28    Recent Labs Lab 05/12/17 1226  AMMONIA 14   Coagulation  Profile:  Recent Labs Lab 05/06/17 2117  INR 0.91   CBC:  Recent Labs Lab 05/06/17 1756 05/07/17 0300 05/09/17 0531 05/12/17 1226  WBC 6.4 5.3 8.9 12.8*  NEUTROABS 5.0  --   --   --   HGB 12.9* 11.8* 12.4* 12.0*  HCT 37.6* 34.7* 37.6* 35.9*  MCV 91.5 91.6 93.3 92.8  PLT 269 243 251 242   Cardiac Enzymes: No results for input(s): CKTOTAL, CKMB, CKMBINDEX, TROPONINI in the last 168 hours. BNP: Invalid input(s): POCBNP CBG: No results for input(s): GLUCAP in the last 168 hours. HbA1C: No results for input(s): HGBA1C in the last 72 hours. Urine analysis:    Component Value Date/Time   COLORURINE YELLOW 05/12/2017 1529   APPEARANCEUR CLEAR 05/12/2017 1529   LABSPEC 1.011 05/12/2017 1529   PHURINE 7.0 05/12/2017 1529   GLUCOSEU NEGATIVE 05/12/2017 1529   HGBUR NEGATIVE 05/12/2017 1529   BILIRUBINUR NEGATIVE 05/12/2017 1529   KETONESUR NEGATIVE 05/12/2017 1529   PROTEINUR NEGATIVE 05/12/2017 1529   UROBILINOGEN 1.0 12/22/2007 1456   NITRITE NEGATIVE 05/12/2017 1529   LEUKOCYTESUR NEGATIVE 05/12/2017 1529   Sepsis Labs: @LABRCNTIP (procalcitonin:4,lacticidven:4) ) Recent  Results (from the past 240 hour(s))  Blood Culture (routine x 2)     Status: None   Collection Time: 05/06/17  5:56 PM  Result Value Ref Range Status   Specimen Description BLOOD RIGHT HAND  Final   Special Requests   Final    BOTTLES DRAWN AEROBIC AND ANAEROBIC Blood Culture adequate volume   Culture NO GROWTH 5 DAYS  Final   Report Status 05/11/2017 FINAL  Final  Blood Culture (routine x 2)     Status: None   Collection Time: 05/06/17  6:00 PM  Result Value Ref Range Status   Specimen Description BLOOD LEFT ARM  Final   Special Requests   Final    BOTTLES DRAWN AEROBIC AND ANAEROBIC Blood Culture adequate volume   Culture NO GROWTH 5 DAYS  Final   Report Status 05/11/2017 FINAL  Final  MRSA PCR Screening     Status: None   Collection Time: 05/06/17 11:10 PM  Result Value Ref Range Status    MRSA by PCR NEGATIVE NEGATIVE Final    Comment:        The GeneXpert MRSA Assay (FDA approved for NASAL specimens only), is one component of a comprehensive MRSA colonization surveillance program. It is not intended to diagnose MRSA infection nor to guide or monitor treatment for MRSA infections.   Culture, blood (Routine X 2) w Reflex to ID Panel     Status: None (Preliminary result)   Collection Time: 05/12/17 12:26 PM  Result Value Ref Range Status   Specimen Description   Final    BLOOD RIGHT ANTECUBITAL BOTTLES DRAWN AEROBIC AND ANAEROBIC   Special Requests   Final    Blood Culture results may not be optimal due to an inadequate volume of blood received in culture bottles   Culture NO GROWTH 1 DAY  Final   Report Status PENDING  Incomplete  Culture, blood (Routine X 2) w Reflex to ID Panel     Status: None (Preliminary result)   Collection Time: 05/12/17 12:34 PM  Result Value Ref Range Status   Specimen Description   Final    BLOOD BLOOD RIGHT WRIST BOTTLES DRAWN AEROBIC AND ANAEROBIC   Special Requests Blood Culture adequate volume  Final   Culture  Setup Time   Final    GRAM POSITIVE COCCI Gram Stain Report Called to,Read Back By and Verified With: ELLINGTON,M @ 0900 ON 7.9.18 BY BOWMAN,L ANAEROBIC BOTTLE ONLY CRITICAL RESULT CALLED TO, READ BACK BY AND VERIFIED WITH: L SEAY, PHARMD 05/13/17 1515 L CHAMPION    Culture GRAM POSITIVE COCCI  Final   Report Status PENDING  Incomplete  Blood Culture ID Panel (Reflexed)     Status: Abnormal   Collection Time: 05/12/17 12:34 PM  Result Value Ref Range Status   Enterococcus species NOT DETECTED NOT DETECTED Final   Listeria monocytogenes NOT DETECTED NOT DETECTED Final   Staphylococcus species DETECTED (A) NOT DETECTED Final    Comment: Methicillin (oxacillin) resistant coagulase negative staphylococcus. Possible blood culture contaminant (unless isolated from more than one blood culture draw or clinical case suggests  pathogenicity). No antibiotic treatment is indicated for blood  culture contaminants. CRITICAL RESULT CALLED TO, READ BACK BY AND VERIFIED WITH: L SEAY, PHARMD 05/13/17 1515 L CHAMPION    Staphylococcus aureus NOT DETECTED NOT DETECTED Final   Methicillin resistance DETECTED (A) NOT DETECTED Final    Comment: CRITICAL RESULT CALLED TO, READ BACK BY AND VERIFIED WITH: L SEAY, PHARMD 05/13/17 1515 L CHAMPION  Streptococcus species NOT DETECTED NOT DETECTED Final   Streptococcus agalactiae NOT DETECTED NOT DETECTED Final   Streptococcus pneumoniae NOT DETECTED NOT DETECTED Final   Streptococcus pyogenes NOT DETECTED NOT DETECTED Final   Acinetobacter baumannii NOT DETECTED NOT DETECTED Final   Enterobacteriaceae species NOT DETECTED NOT DETECTED Final   Enterobacter cloacae complex NOT DETECTED NOT DETECTED Final   Escherichia coli NOT DETECTED NOT DETECTED Final   Klebsiella oxytoca NOT DETECTED NOT DETECTED Final   Klebsiella pneumoniae NOT DETECTED NOT DETECTED Final   Proteus species NOT DETECTED NOT DETECTED Final   Serratia marcescens NOT DETECTED NOT DETECTED Final   Haemophilus influenzae NOT DETECTED NOT DETECTED Final   Neisseria meningitidis NOT DETECTED NOT DETECTED Final   Pseudomonas aeruginosa NOT DETECTED NOT DETECTED Final   Candida albicans NOT DETECTED NOT DETECTED Final   Candida glabrata NOT DETECTED NOT DETECTED Final   Candida krusei NOT DETECTED NOT DETECTED Final   Candida parapsilosis NOT DETECTED NOT DETECTED Final   Candida tropicalis NOT DETECTED NOT DETECTED Final     Scheduled Meds: . aspirin  325 mg Oral Daily  . chlorhexidine  15 mL Mouth Rinse BID  . docusate sodium  100 mg Oral BID  . donepezil  10 mg Oral QHS  . enoxaparin (LOVENOX) injection  40 mg Subcutaneous Q24H  . latanoprost  1 drop Both Eyes QHS  . mouth rinse  15 mL Mouth Rinse q12n4p  . memantine  10 mg Oral BID  . pantoprazole  40 mg Oral BID AC  . thiamine  100 mg Oral Daily    Continuous Infusions:  Procedures/Studies: Ct Angio Head W Or Wo Contrast  Result Date: 04/30/2017 CLINICAL DATA:  Acute encephalopathy and syncopal episode. Multiple small acute brainstem and posterior fossa infarcts. EXAM: CT ANGIOGRAPHY HEAD AND NECK TECHNIQUE: Multidetector CT imaging of the head and neck was performed using the standard protocol during bolus administration of intravenous contrast. Multiplanar CT image reconstructions and MIPs were obtained to evaluate the vascular anatomy. Carotid stenosis measurements (when applicable) are obtained utilizing NASCET criteria, using the distal internal carotid diameter as the denominator. CONTRAST:  50 mL Isovue 370 COMPARISON:  Brain MRI 04/28/2017 FINDINGS: CT HEAD FINDINGS Brain: No hemorrhage or mass effect. There is faint hypoattenuation at the sites of diffusion restriction described on recent MRI. There is periventricular hypoattenuation compatible with chronic microvascular disease. Vascular: No hyperdense vessel or unexpected calcification. Skull: Normal visualized skull base, calvarium and extracranial soft tissues. Sinuses/Orbits: No sinus fluid levels or advanced mucosal thickening. No mastoid effusion. Normal orbits. CTA NECK FINDINGS Aortic arch: There is no aneurysm or dissection of the visualized ascending aorta or aortic arch. There is a normal 3 vessel branching pattern. The visualized proximal subclavian arteries are normal. Right carotid system: The right common carotid origin is widely patent. There is no common carotid or internal carotid artery dissection or aneurysm. No hemodynamically significant stenosis. Left carotid system: The left common carotid origin is widely patent. There is no common carotid or internal carotid artery dissection or aneurysm. No hemodynamically significant stenosis. Vertebral arteries: The vertebral system is left dominant. There is calcification in the left P1 segment. The right vertebral artery is  diminutive along its entire course. There is no flow demonstrated in the right V4 segment. Skeleton: There is no bony spinal canal stenosis. No lytic or blastic lesions. Other neck: The nasopharynx is clear. The oropharynx and hypopharynx are normal. The epiglottis is normal. The supraglottic larynx, glottis and subglottic  larynx are normal. No retropharyngeal collection. The parapharyngeal spaces are preserved. The parotid and submandibular glands are normal. No sialolithiasis or salivary ductal dilatation. The thyroid gland is normal. There is no cervical lymphadenopathy. Upper chest: There is ground-glass opacity in the posterior right upper lobe. Small bilateral pleural effusions with associated atelectasis. Review of the MIP images confirms the above findings CTA HEAD FINDINGS Anterior circulation: --Intracranial internal carotid arteries: Normal. --Anterior cerebral arteries: Normal. --Middle cerebral arteries: Normal. --Posterior communicating arteries: Present on the right. Posterior circulation: --Posterior cerebral arteries: Normal. --Superior cerebellar arteries: Normal. --Basilar artery: Normal. --Anterior inferior cerebellar arteries: Normal. --Posterior inferior cerebellar arteries: Left dominant. Venous sinuses: As permitted by contrast timing, patent. Anatomic variants: None Delayed phase: No parenchymal contrast enhancement. Review of the MIP images confirms the above findings IMPRESSION: 1. No emergent large vessel occlusion. 2. No hemodynamically significant stenosis of the major cervical arteries. 3. Congenitally diminutive right vertebral artery. Electronically Signed   By: Ulyses Jarred M.D.   On: 04/30/2017 21:22   Dg Chest 1 View  Result Date: 04/27/2017 CLINICAL DATA:  Found unresponsive. EXAM: CHEST 1 VIEW COMPARISON:  Chest x-ray dated 12/25/2007. FINDINGS: Heart size is upper normal. Overall cardiomediastinal silhouette is stable in size and configuration. Lungs are clear. No pleural  effusion or pneumothorax seen. Osseous and soft tissue structures about the chest are unremarkable. IMPRESSION: No active disease. Electronically Signed   By: Franki Cabot M.D.   On: 04/27/2017 20:50   Ct Head Wo Contrast  Result Date: 05/06/2017 CLINICAL DATA:  Hypothermia.  Altered mental status. EXAM: CT HEAD WITHOUT CONTRAST TECHNIQUE: Contiguous axial images were obtained from the base of the skull through the vertex without intravenous contrast. COMPARISON:  Head CT 04/30/2017, 04/27/2017.  Brain MRI 04/28/2017 FINDINGS: Brain: No acute intracranial hemorrhage. Small acute infarcts on prior MRI are not well seen by CT. Stable degree of atrophy and chronic small vessel ischemia. Remote lacunar infarcts in the right thalamus. No extra-axial fluid collection. Vascular: Atherosclerosis of skullbase vasculature without hyperdense vessel or abnormal calcification. Skull: No fracture or focal lesion. Sinuses/Orbits: Paranasal sinuses and mastoid air cells are clear. The visualized orbits are unremarkable. Bilateral cataract resection. Other: None. IMPRESSION: No acute abnormality. Stable degree of atrophy and chronic small vessel ischemia. Recent small infarcts on MRI are not well seen by CT. Electronically Signed   By: Jeb Levering M.D.   On: 05/06/2017 20:36   Ct Head Wo Contrast  Result Date: 04/27/2017 CLINICAL DATA:  Found unresponsive earlier today. Low blood pressure. Partial resuscitation. EXAM: CT HEAD WITHOUT CONTRAST TECHNIQUE: Contiguous axial images were obtained from the base of the skull through the vertex without intravenous contrast. COMPARISON:  03/30/2015 brain MR. FINDINGS: Brain: No evidence for acute stroke, acute hemorrhage, mass lesion, or extra-axial fluid. There is extreme atrophy, with hydrocephalus ex vacuo. Extensive involvement of the white matter by hypoattenuation, representing chronic microvascular ischemic change. There is an old RIGHT thalamus lacunar infarct. Vascular:  No hyperdense vessel or unexpected calcification. Skull: Normal. Negative for fracture or focal lesion. Sinuses/Orbits: No acute findings or layering fluid. BILATERAL cataract extraction. Other: None.  Compared with prior brain MR, similar appearance. IMPRESSION: Severe atrophy and extensive white matter disease. Evidence for chronic cerebral infarction. No acute intracranial findings are evident. Electronically Signed   By: Staci Righter M.D.   On: 04/27/2017 20:22   Ct Angio Neck W Or Wo Contrast  Result Date: 04/30/2017 CLINICAL DATA:  Acute encephalopathy and syncopal episode. Multiple small  acute brainstem and posterior fossa infarcts. EXAM: CT ANGIOGRAPHY HEAD AND NECK TECHNIQUE: Multidetector CT imaging of the head and neck was performed using the standard protocol during bolus administration of intravenous contrast. Multiplanar CT image reconstructions and MIPs were obtained to evaluate the vascular anatomy. Carotid stenosis measurements (when applicable) are obtained utilizing NASCET criteria, using the distal internal carotid diameter as the denominator. CONTRAST:  50 mL Isovue 370 COMPARISON:  Brain MRI 04/28/2017 FINDINGS: CT HEAD FINDINGS Brain: No hemorrhage or mass effect. There is faint hypoattenuation at the sites of diffusion restriction described on recent MRI. There is periventricular hypoattenuation compatible with chronic microvascular disease. Vascular: No hyperdense vessel or unexpected calcification. Skull: Normal visualized skull base, calvarium and extracranial soft tissues. Sinuses/Orbits: No sinus fluid levels or advanced mucosal thickening. No mastoid effusion. Normal orbits. CTA NECK FINDINGS Aortic arch: There is no aneurysm or dissection of the visualized ascending aorta or aortic arch. There is a normal 3 vessel branching pattern. The visualized proximal subclavian arteries are normal. Right carotid system: The right common carotid origin is widely patent. There is no common  carotid or internal carotid artery dissection or aneurysm. No hemodynamically significant stenosis. Left carotid system: The left common carotid origin is widely patent. There is no common carotid or internal carotid artery dissection or aneurysm. No hemodynamically significant stenosis. Vertebral arteries: The vertebral system is left dominant. There is calcification in the left P1 segment. The right vertebral artery is diminutive along its entire course. There is no flow demonstrated in the right V4 segment. Skeleton: There is no bony spinal canal stenosis. No lytic or blastic lesions. Other neck: The nasopharynx is clear. The oropharynx and hypopharynx are normal. The epiglottis is normal. The supraglottic larynx, glottis and subglottic larynx are normal. No retropharyngeal collection. The parapharyngeal spaces are preserved. The parotid and submandibular glands are normal. No sialolithiasis or salivary ductal dilatation. The thyroid gland is normal. There is no cervical lymphadenopathy. Upper chest: There is ground-glass opacity in the posterior right upper lobe. Small bilateral pleural effusions with associated atelectasis. Review of the MIP images confirms the above findings CTA HEAD FINDINGS Anterior circulation: --Intracranial internal carotid arteries: Normal. --Anterior cerebral arteries: Normal. --Middle cerebral arteries: Normal. --Posterior communicating arteries: Present on the right. Posterior circulation: --Posterior cerebral arteries: Normal. --Superior cerebellar arteries: Normal. --Basilar artery: Normal. --Anterior inferior cerebellar arteries: Normal. --Posterior inferior cerebellar arteries: Left dominant. Venous sinuses: As permitted by contrast timing, patent. Anatomic variants: None Delayed phase: No parenchymal contrast enhancement. Review of the MIP images confirms the above findings IMPRESSION: 1. No emergent large vessel occlusion. 2. No hemodynamically significant stenosis of the major  cervical arteries. 3. Congenitally diminutive right vertebral artery. Electronically Signed   By: Ulyses Jarred M.D.   On: 04/30/2017 21:22   Mr Jodene Nam Head Wo Contrast  Result Date: 05/07/2017 CLINICAL DATA:  Hypothermia. Altered mental status. History of dementia. EXAM: MRI HEAD WITHOUT CONTRAST MRA HEAD WITHOUT CONTRAST TECHNIQUE: Multiplanar, multiecho pulse sequences of the brain and surrounding structures were obtained without intravenous contrast. Angiographic images of the head were obtained using MRA technique without contrast. COMPARISON:  04/28/2017 FINDINGS: MRI HEAD FINDINGS Brain: Subacute right lateral medullary infarct appears more extensive than on prior. A subacute left cerebellar infarct on the previous study has decreased in restriction intensity. Cerebral cortical infarcts seen previously are no longer visualized. There are numerous small remote bilateral cerebellar infarcts. Remote lacunar infarct in the right thalamus. Atrophy with ventriculomegaly, especially of the temporal and occipital lobes.  There is chronic confluent FLAIR hyperintensity in the cerebral white matter with remote lacunes in the bilateral centrum semiovale. The corpus callosum is thinned the setting of cerebral volume loss. Numerous foci of chronic lobar microhemorrhage. Vascular: Arterial findings below. Normal dural venous sinus flow voids. Skull and upper cervical spine: Negative for marrow lesion. Sinuses/Orbits: No acute finding.  Bilateral cataract resection. MRA HEAD FINDINGS Solitary visible left vertebral artery, stable from CTA 04/28/2017. The right vertebral artery is hypoplastic and may be occluded at the V4 segment. Mild proximal basilar atheromatous type narrowing. The right P1 segment is hypoplastic. Right P2 segment is attenuated and appears diffusely irregular, apparently new from prior but potentially artifactual on this motion degraded study. No reversible flow limiting stenosis in the anterior  circulation. There is moderate irregularity of bilateral MCA branches, again potentially related to artifact rather than atherosclerosis. IMPRESSION: 1. Subacute right lateral medullary infarct with questionable increased extent compared to 04/28/2017 MRI. Subacute infarcts in the left cerebellum and bilateral cerebral cortex are fading. No new site of ischemia; no hemorrhagic conversion. 2. Motion degraded MRA which may account for apparent newly narrowed bilateral M2 and right P2 segments when compared to CTA 04/30/2017. 3. Advanced atrophy and chronic white matter disease. Remote lobar micro hemorrhages may reflect underlying amyloid angiography. Electronically Signed   By: Monte Fantasia M.D.   On: 05/07/2017 10:31   Mr Brain Wo Contrast  Result Date: 05/07/2017 CLINICAL DATA:  Hypothermia. Altered mental status. History of dementia. EXAM: MRI HEAD WITHOUT CONTRAST MRA HEAD WITHOUT CONTRAST TECHNIQUE: Multiplanar, multiecho pulse sequences of the brain and surrounding structures were obtained without intravenous contrast. Angiographic images of the head were obtained using MRA technique without contrast. COMPARISON:  04/28/2017 FINDINGS: MRI HEAD FINDINGS Brain: Subacute right lateral medullary infarct appears more extensive than on prior. A subacute left cerebellar infarct on the previous study has decreased in restriction intensity. Cerebral cortical infarcts seen previously are no longer visualized. There are numerous small remote bilateral cerebellar infarcts. Remote lacunar infarct in the right thalamus. Atrophy with ventriculomegaly, especially of the temporal and occipital lobes. There is chronic confluent FLAIR hyperintensity in the cerebral white matter with remote lacunes in the bilateral centrum semiovale. The corpus callosum is thinned the setting of cerebral volume loss. Numerous foci of chronic lobar microhemorrhage. Vascular: Arterial findings below. Normal dural venous sinus flow voids. Skull  and upper cervical spine: Negative for marrow lesion. Sinuses/Orbits: No acute finding.  Bilateral cataract resection. MRA HEAD FINDINGS Solitary visible left vertebral artery, stable from CTA 04/28/2017. The right vertebral artery is hypoplastic and may be occluded at the V4 segment. Mild proximal basilar atheromatous type narrowing. The right P1 segment is hypoplastic. Right P2 segment is attenuated and appears diffusely irregular, apparently new from prior but potentially artifactual on this motion degraded study. No reversible flow limiting stenosis in the anterior circulation. There is moderate irregularity of bilateral MCA branches, again potentially related to artifact rather than atherosclerosis. IMPRESSION: 1. Subacute right lateral medullary infarct with questionable increased extent compared to 04/28/2017 MRI. Subacute infarcts in the left cerebellum and bilateral cerebral cortex are fading. No new site of ischemia; no hemorrhagic conversion. 2. Motion degraded MRA which may account for apparent newly narrowed bilateral M2 and right P2 segments when compared to CTA 04/30/2017. 3. Advanced atrophy and chronic white matter disease. Remote lobar micro hemorrhages may reflect underlying amyloid angiography. Electronically Signed   By: Monte Fantasia M.D.   On: 05/07/2017 10:31   Mr Brain  Wo Contrast  Result Date: 04/28/2017 CLINICAL DATA:  Found unresponsive. Syncope and collapse. Prior stroke. EXAM: MRI HEAD WITHOUT CONTRAST TECHNIQUE: Multiplanar, multiecho pulse sequences of the brain and surrounding structures were obtained without intravenous contrast. COMPARISON:  Head CT 04/27/2017 and MRI 03/30/2015 FINDINGS: Brain: Small acute infarcts are present in the right medulla and posteroinferior left cerebellar hemisphere. There are also punctate acute cortical infarcts in the high posterior frontal lobes bilaterally. Scattered chronic cerebral and cerebellar microhemorrhages are again seen. A small  chronic right occipital cortical infarct is new or larger than on the prior MRI. There also may be a tiny left occipital chronic cortical infarct. Patchy to confluent cerebral white matter T2 hyperintensities are similar to the prior MRI and nonspecific but compatible with extensive chronic small vessel ischemic disease. There are numerous small chronic infarcts in the cerebellum bilaterally. Chronic lacunar infarcts are also again seen in the anterior left centrum semiovale and right thalamus. There is marked cerebral atrophy. No mass, midline shift, or extra-axial fluid collection is seen. Vascular: Unchanged poor visualization of the distal right vertebral artery, possibly occluded. Dominant appearing left vertebral artery with grossly preserved vascular flow voids elsewhere. Skull and upper cervical spine: Unremarkable bone marrow signal. Sinuses/Orbits: Bilateral cataract extraction. Paranasal sinuses and mastoid air cells are clear. Other: None. IMPRESSION: 1. 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 2. Advanced cerebral atrophy and chronic small vessel ischemic disease with chronic infarcts as above. Electronically Signed   By: Logan Bores M.D.   On: 04/28/2017 12:13   US Carotid Bilateral (at [facility] Only)  Result Date: 04/29/2017 CLINICAL DATA:  Stroke.  Syncopal episode. EXAM: BILATERAL CAROTID DUPLEX ULTRASOUND TECHNIQUE: Pearline Cables scale imaging, color Doppler and duplex ultrasound were performed of bilateral carotid and vertebral arteries in the neck. COMPARISON:  None. FINDINGS: Criteria: Quantification of carotid stenosis is based on velocity parameters that correlate the residual internal carotid diameter with NASCET-based stenosis levels, using the diameter of the distal internal carotid lumen as the denominator for stenosis measurement. The following velocity measurements were obtained: RIGHT ICA:  45/6 cm/sec CCA:  381/01 cm/sec SYSTOLIC ICA/CCA RATIO:   0.4 DIASTOLIC ICA/CCA RATIO:  0.4 ECA:  105 cm/sec LEFT ICA:  78/6 cm/sec CCA:  751/02 cm/sec SYSTOLIC ICA/CCA RATIO:  0.7 DIASTOLIC ICA/CCA RATIO:  0.6 ECA:  102 cm/sec RIGHT CAROTID ARTERY: There is a very minimal amount of intimal thickening within the right carotid bulb (image 17)), extending to involve the origin and proximal aspects of the right internal carotid artery (image 25), not resulting in elevated peak systolic velocities within the interrogated course the right internal carotid artery to suggest a hemodynamically significant stenosis. RIGHT VERTEBRAL ARTERY:  Antegrade flow LEFT CAROTID ARTERY: There is a moderate amount of eccentric mixed echogenic plaque within the left carotid bulb (images 49 and 51), not resulting in elevated peak systolic velocities within the interrogated course of the left internal carotid artery to suggest a hemodynamically significant stenosis. LEFT VERTEBRAL ARTERY:  Antegrade flow IMPRESSION: 1. Moderate amount of left-sided atherosclerotic plaque, not resulting in a hemodynamically significant stenosis. 2. Minimal amount of right-sided intimal wall thickening, not resulting in a hemodynamically significant stenosis. Electronically Signed   By: Sandi Mariscal M.D.   On: 04/29/2017 11:56   Dg Chest Port 1 View  Result Date: 05/08/2017 CLINICAL DATA:  Rhonchi.  Congestion. EXAM: PORTABLE CHEST 1 VIEW COMPARISON:  05/06/2017 FINDINGS: Upright AP view of the chest was obtained. There  is no focal airspace disease or pulmonary edema. There is a skin fold overlying the lateral right chest. Negative for a pneumothorax. Heart and mediastinum are with normal limits and stable. No acute bone abnormality. IMPRESSION: No active disease. Electronically Signed   By: Markus Daft M.D.   On: 05/08/2017 10:58   Dg Chest Port 1 View  Result Date: 05/06/2017 CLINICAL DATA:  Hypoxia EXAM: PORTABLE CHEST 1 VIEW COMPARISON:  April 27, 2017 FINDINGS: The heart size and mediastinal contours are  stable. The aorta is tortuous. Both lungs are clear. Degenerative joint changes of the shoulders are noted. IMPRESSION: No active cardiopulmonary disease. Electronically Signed   By: Abelardo Diesel M.D.   On: 05/06/2017 18:18   Dg Swallowing Func-speech Pathology  Result Date: 05/10/2017 Clinical Impression             Pt currently with a moderate- severe oropharyngeal/ cervical esophageal phase dysphagia. Oral phase of swallow is disorganized with tongue pumping, piecemeal swallowing, delayed oral transit, and premature spill to the level of the pyriform sinuses. Pt also with reduced base of tongue retraction and occasional reduced airway closure resulting in silent aspiration of nectar-thick liquids- trace with teaspoon sip, frank with straw sips; pt able to clear most of aspirated material when cued to clear throat. Pt also with decreased UES opening and moderate-severe residuals in the pharynx; pt had hiccups during the study and there was backflow from the upper esophagus into the pharynx as well. Suspect that pt aspirated some of the residuals/ backflow as pt developed a wet vocal quality during the study; pt also expectorated significant amounts of what appeared to be barium mixed with saliva throughout the study. Pt appeared to have greater success with puree bolus, likely from the increased sensory input with a heavier material which resulted in fewer residuals and no aspiration of puree material was observed. The quality of the study was also limited by the pt's positioning (head leaning on wall, shoulder obstructing view at times). Aspiration risk is high at this time. If plan is to continue with PO intake, the safest would be puree and pudding-thick liquids, meds crushed in puree with oral care before and after meal. Would highly recommend for the study to be repeated as the pt's hiccups appeared to increase backflow from the esophagus; additionally pt seemed more lethargic compared to visit this  morning. Will continue to follow.  Swallow Evaluation Recommendations      SLP Diet Recommendations: NPO vs Dysphagia 1 (Puree) solids;Pudding thick liquid      Medication Administration: Crushed with puree   Supervision: Full assist for feeding;Full supervision/cueing for compensatory strategies   Compensations: Minimize environmental distractions;Slow rate;Small sips/bites   Postural Changes: Remain semi-upright after after feeds/meals (Comment);Seated upright at 90 degrees   Oral Care Recommendations: Oral care before and after PO       Amy Dionicia Abler, MA, CCC-SLP 05/10/2017,1:37 PM [phone]   Electronically signed by Kern Reap, CCC-SLP at 05/10/2017 1:45 PM Electronically signed by Kern Reap, CCC-SLP at 05/10/2017 1:48 PM     Larron Armor, DO  [facility] Pager [phone]  If 7PM-7AM, please contact night-coverage [email or web address] Password [email or web address] 05/13/2017, 4:12 PM   LOS: 6 days

## 2017-05-13 NOTE — Progress Notes (Signed)
LCSW following for continued disposition: back to SNF  LCSW contacted SNF: [facility] regarding updates on patient and plans. [place] continues to report he can return.    Family involved in decision making and also agreeable to plan.  Lane Hacker, MSW Clinical Social Work: [employer] Coverage for :  [phone]

## 2017-05-13 NOTE — Discharge Summary (Signed)
Physician Discharge Summary  [name] ZOX:[id number] DOB: 1932/06/25 DOA: 05/06/2017  PCP: Dione Housekeeper, MD  Admit date: 05/06/2017 Discharge date: 05/14/17  Admitted From: [name] Disposition:  [name]  Recommendations for Outpatient Follow-up:  1. Follow up with PCP in 1-2 weeks 2. Please obtain BMP/CBC in one week 3.   Please consult palliative care services to see patient 4.   Please consult speech therapy to see patient within one week 5.   Please assist the patient with meals--the patient should take small bites and be set up at 90 with meals.   Discharge Condition: Stable CODE STATUS: DNR Diet recommendation: dysphagia 1 with honey thickened liquids   Brief/Interim Summary: 83 year old male with history of remote prostate cancer, dementia, glaucoma, and recent admission 6/23-28 4 stroke who was discharged to a skilled nursing facility. The patient was transferred back to the hospital after he was found to be throwing up blood and developing hypothermia. In the emergency department, he had no further emesis. His initial temperature was 57F. He was placed on a bear hugger and started on vancomycin and Zosyn for possible sepsis. MRI demonstrates a possible subacute extension of a stroke seen during his previous hospitalization. His white blood cell count has been normal. Even though his chest x-ray was unremarkable, he has a coarse cough suggestive of pneumonia or aspiration. His urinalysis was unremarkable. Blood cultures were obtained and are pending. TSH was within normal limits and Cortrosyn stimulation test was normal.  Discharge Diagnoses:  Hypothermia, likely due to underlying infection (suspect aspiration pneumonia)versus extension of stroke - Discontinue vancomycin as MRSA PCR is negative - initial 7/2 blood cultures--neg -  7/8 blood cultures--contaminant - UA--neg for pyuria - PCT <0.10 - 05/10/2017 Cortrosyn stimulation test normal -  Continue Bear hugger as needed to keep temperature within normal limits - MRI demonstrated a possible extension of one of the strokes that was seen during his previous hospitalization - SLP evaluation--recommended modified barium swallow-->ultimately recommended dysphagia 1 diet with pudding thickened liquids however modified barium swallow may have been compromised by the patient's hiccups -repeat speech eval recommend d/c with honey thickened liquids, dysphagia 1 diet-->repeat speech eval in 1 week after d/c - Repeat CXR(after hydration)--neg - Gradually, his core body temperature has improved and he has not required warming blanket > 48 hours prior to discharge -7/8 Repeat blood cultures--CoNS 1 of 2-->contaminant -Check thiamine level--pending at time of discharge -d/c with thiamine 100mg  po daily -[id number]--82  Acute metabolic encephalopathy -Likely related to hypothermia as the patient's mental status worsens with his temperature -back to baseline at time of discharge -TSH 3.434 -Check ammonia--14 -RPR--NR -Urinalysis--neg for pyuria -Serum B12--545 -mental status improves when temp is improved  Bacteremia -Likely contaminant -BCID--CoNS 1 of 2  -d/c vancomycin  Hematemesis - None observed since admission--hemoglobin remained stablethroughout the hospitalization - BID PPI during the hospitalization - discharge with protonix 40 mg daily x 30 days  Subacute strokes with extension of recent stroke -05/07/2017 MRI brain subacute right lateral medullary infarct with increased extension, no new stroke. -Extension of stroke likely due to relative hypotension - PT/OT/SLP reevaluations-->SNF - Continue daily aspirin - LDL was 66, A1c 5.4, ECHO with preserved EF of 60-65%, carotid duplex with 1-39% stenosis a week ago - Per Neurology, due to amyloid angiopathy and dementia, patient is not a good candidate for anticoagulation and they did not pursue further work up  during his previous hospitalization -Cased was discussed with neurology, Dr. Sherin Quarry need for transfer or  further workup - F/u with Dr. Leonie Man as outpatient -continue ASA 325 mg daily  Bradycardia, chronic,  - may be side effect ofAricept - exacerbated by hypothermia -No AV blocks or pauses on telemetry--personally reviewed tele  Dementia/Goals of Care -confusion worse after recent CVAs, although patient had apparently progressive disease prior -DNR status confirmed with family -palliative care meeting 7/6-->plan to d/c to SNF with palliative services -case discussed with palliative medicine team   Discharge Instructions  Discharge Instructions    Diet - low sodium heart healthy    Complete by:  As directed    Increase activity slowly    Complete by:  As directed      Allergies as of 05/13/2017   No Known Allergies     Medication List    TAKE these medications   aspirin 325 MG tablet Take 1 tablet (325 mg total) by mouth daily.   donepezil 10 MG tablet Commonly known as:  ARICEPT Take 1 tablet (10 mg total) by mouth at bedtime.   furosemide 20 MG tablet Commonly known as:  LASIX Take 20 mg by mouth daily as needed for fluid.   latanoprost 0.005 % ophthalmic solution Commonly known as:  XALATAN Place 1 drop into both eyes at bedtime.   memantine 10 MG tablet Commonly known as:  NAMENDA Take 1 tablet (10 mg total) by mouth 2 (two) times daily.   RESOURCE THICKENUP CLEAR Powd Please use to thicken liquid to honey thickened consistency   thiamine 100 MG tablet Take 1 tablet (100 mg total) by mouth daily. Start taking on:  05/14/2017   Vitamin D 2000 units Caps Take 1 capsule by mouth daily.      Contact information for after-discharge care    Destination    [facility] .   Specialty:  [place] information: [place] [phone]             No Known  Allergies  Consultations:  none   Procedures/Studies: Ct Angio Head W Or Wo Contrast  Result Date: 04/30/2017 CLINICAL DATA:  Acute encephalopathy and syncopal episode. Multiple small acute brainstem and posterior fossa infarcts. EXAM: CT ANGIOGRAPHY HEAD AND NECK TECHNIQUE: Multidetector CT imaging of the head and neck was performed using the standard protocol during bolus administration of intravenous contrast. Multiplanar CT image reconstructions and MIPs were obtained to evaluate the vascular anatomy. Carotid stenosis measurements (when applicable) are obtained utilizing NASCET criteria, using the distal internal carotid diameter as the denominator. CONTRAST:  50 mL Isovue 370 COMPARISON:  Brain MRI 04/28/2017 FINDINGS: CT HEAD FINDINGS Brain: No hemorrhage or mass effect. There is faint hypoattenuation at the sites of diffusion restriction described on recent MRI. There is periventricular hypoattenuation compatible with chronic microvascular disease. Vascular: No hyperdense vessel or unexpected calcification. Skull: Normal visualized skull base, calvarium and extracranial soft tissues. Sinuses/Orbits: No sinus fluid levels or advanced mucosal thickening. No mastoid effusion. Normal orbits. CTA NECK FINDINGS Aortic arch: There is no aneurysm or dissection of the visualized ascending aorta or aortic arch. There is a normal 3 vessel branching pattern. The visualized proximal subclavian arteries are normal. Right carotid system: The right common carotid origin is widely patent. There is no common carotid or internal carotid artery dissection or aneurysm. No hemodynamically significant stenosis. Left carotid system: The left common carotid origin is widely patent. There is no common carotid or internal carotid artery dissection or aneurysm. No hemodynamically significant stenosis. Vertebral arteries:  The vertebral system is left dominant. There is calcification in the left P1 segment. The right vertebral  artery is diminutive along its entire course. There is no flow demonstrated in the right V4 segment. Skeleton: There is no bony spinal canal stenosis. No lytic or blastic lesions. Other neck: The nasopharynx is clear. The oropharynx and hypopharynx are normal. The epiglottis is normal. The supraglottic larynx, glottis and subglottic larynx are normal. No retropharyngeal collection. The parapharyngeal spaces are preserved. The parotid and submandibular glands are normal. No sialolithiasis or salivary ductal dilatation. The thyroid gland is normal. There is no cervical lymphadenopathy. Upper chest: There is ground-glass opacity in the posterior right upper lobe. Small bilateral pleural effusions with associated atelectasis. Review of the MIP images confirms the above findings CTA HEAD FINDINGS Anterior circulation: --Intracranial internal carotid arteries: Normal. --Anterior cerebral arteries: Normal. --Middle cerebral arteries: Normal. --Posterior communicating arteries: Present on the right. Posterior circulation: --Posterior cerebral arteries: Normal. --Superior cerebellar arteries: Normal. --Basilar artery: Normal. --Anterior inferior cerebellar arteries: Normal. --Posterior inferior cerebellar arteries: Left dominant. Venous sinuses: As permitted by contrast timing, patent. Anatomic variants: None Delayed phase: No parenchymal contrast enhancement. Review of the MIP images confirms the above findings IMPRESSION: 1. No emergent large vessel occlusion. 2. No hemodynamically significant stenosis of the major cervical arteries. 3. Congenitally diminutive right vertebral artery. Electronically Signed   By: Ulyses Jarred M.D.   On: 04/30/2017 21:22   Dg Chest 1 View  Result Date: 04/27/2017 CLINICAL DATA:  Found unresponsive. EXAM: CHEST 1 VIEW COMPARISON:  Chest x-ray dated 12/25/2007. FINDINGS: Heart size is upper normal. Overall cardiomediastinal silhouette is stable in size and configuration. Lungs are clear.  No pleural effusion or pneumothorax seen. Osseous and soft tissue structures about the chest are unremarkable. IMPRESSION: No active disease. Electronically Signed   By: Franki Cabot M.D.   On: 04/27/2017 20:50   Ct Head Wo Contrast  Result Date: 05/06/2017 CLINICAL DATA:  Hypothermia.  Altered mental status. EXAM: CT HEAD WITHOUT CONTRAST TECHNIQUE: Contiguous axial images were obtained from the base of the skull through the vertex without intravenous contrast. COMPARISON:  Head CT 04/30/2017, 04/27/2017.  Brain MRI 04/28/2017 FINDINGS: Brain: No acute intracranial hemorrhage. Small acute infarcts on prior MRI are not well seen by CT. Stable degree of atrophy and chronic small vessel ischemia. Remote lacunar infarcts in the right thalamus. No extra-axial fluid collection. Vascular: Atherosclerosis of skullbase vasculature without hyperdense vessel or abnormal calcification. Skull: No fracture or focal lesion. Sinuses/Orbits: Paranasal sinuses and mastoid air cells are clear. The visualized orbits are unremarkable. Bilateral cataract resection. Other: None. IMPRESSION: No acute abnormality. Stable degree of atrophy and chronic small vessel ischemia. Recent small infarcts on MRI are not well seen by CT. Electronically Signed   By: Jeb Levering M.D.   On: 05/06/2017 20:36   Ct Head Wo Contrast  Result Date: 04/27/2017 CLINICAL DATA:  Found unresponsive earlier today. Low blood pressure. Partial resuscitation. EXAM: CT HEAD WITHOUT CONTRAST TECHNIQUE: Contiguous axial images were obtained from the base of the skull through the vertex without intravenous contrast. COMPARISON:  03/30/2015 brain MR. FINDINGS: Brain: No evidence for acute stroke, acute hemorrhage, mass lesion, or extra-axial fluid. There is extreme atrophy, with hydrocephalus ex vacuo. Extensive involvement of the white matter by hypoattenuation, representing chronic microvascular ischemic change. There is an old RIGHT thalamus lacunar infarct.  Vascular: No hyperdense vessel or unexpected calcification. Skull: Normal. Negative for fracture or focal lesion. Sinuses/Orbits: No acute findings or layering  fluid. BILATERAL cataract extraction. Other: None.  Compared with prior brain MR, similar appearance. IMPRESSION: Severe atrophy and extensive white matter disease. Evidence for chronic cerebral infarction. No acute intracranial findings are evident. Electronically Signed   By: Staci Righter M.D.   On: 04/27/2017 20:22   Ct Angio Neck W Or Wo Contrast  Result Date: 04/30/2017 CLINICAL DATA:  Acute encephalopathy and syncopal episode. Multiple small acute brainstem and posterior fossa infarcts. EXAM: CT ANGIOGRAPHY HEAD AND NECK TECHNIQUE: Multidetector CT imaging of the head and neck was performed using the standard protocol during bolus administration of intravenous contrast. Multiplanar CT image reconstructions and MIPs were obtained to evaluate the vascular anatomy. Carotid stenosis measurements (when applicable) are obtained utilizing NASCET criteria, using the distal internal carotid diameter as the denominator. CONTRAST:  50 mL Isovue 370 COMPARISON:  Brain MRI 04/28/2017 FINDINGS: CT HEAD FINDINGS Brain: No hemorrhage or mass effect. There is faint hypoattenuation at the sites of diffusion restriction described on recent MRI. There is periventricular hypoattenuation compatible with chronic microvascular disease. Vascular: No hyperdense vessel or unexpected calcification. Skull: Normal visualized skull base, calvarium and extracranial soft tissues. Sinuses/Orbits: No sinus fluid levels or advanced mucosal thickening. No mastoid effusion. Normal orbits. CTA NECK FINDINGS Aortic arch: There is no aneurysm or dissection of the visualized ascending aorta or aortic arch. There is a normal 3 vessel branching pattern. The visualized proximal subclavian arteries are normal. Right carotid system: The right common carotid origin is widely patent. There is no  common carotid or internal carotid artery dissection or aneurysm. No hemodynamically significant stenosis. Left carotid system: The left common carotid origin is widely patent. There is no common carotid or internal carotid artery dissection or aneurysm. No hemodynamically significant stenosis. Vertebral arteries: The vertebral system is left dominant. There is calcification in the left P1 segment. The right vertebral artery is diminutive along its entire course. There is no flow demonstrated in the right V4 segment. Skeleton: There is no bony spinal canal stenosis. No lytic or blastic lesions. Other neck: The nasopharynx is clear. The oropharynx and hypopharynx are normal. The epiglottis is normal. The supraglottic larynx, glottis and subglottic larynx are normal. No retropharyngeal collection. The parapharyngeal spaces are preserved. The parotid and submandibular glands are normal. No sialolithiasis or salivary ductal dilatation. The thyroid gland is normal. There is no cervical lymphadenopathy. Upper chest: There is ground-glass opacity in the posterior right upper lobe. Small bilateral pleural effusions with associated atelectasis. Review of the MIP images confirms the above findings CTA HEAD FINDINGS Anterior circulation: --Intracranial internal carotid arteries: Normal. --Anterior cerebral arteries: Normal. --Middle cerebral arteries: Normal. --Posterior communicating arteries: Present on the right. Posterior circulation: --Posterior cerebral arteries: Normal. --Superior cerebellar arteries: Normal. --Basilar artery: Normal. --Anterior inferior cerebellar arteries: Normal. --Posterior inferior cerebellar arteries: Left dominant. Venous sinuses: As permitted by contrast timing, patent. Anatomic variants: None Delayed phase: No parenchymal contrast enhancement. Review of the MIP images confirms the above findings IMPRESSION: 1. No emergent large vessel occlusion. 2. No hemodynamically significant stenosis of the  major cervical arteries. 3. Congenitally diminutive right vertebral artery. Electronically Signed   By: Ulyses Jarred M.D.   On: 04/30/2017 21:22   Mr Jodene Nam Head Wo Contrast  Result Date: 05/07/2017 CLINICAL DATA:  Hypothermia. Altered mental status. History of dementia. EXAM: MRI HEAD WITHOUT CONTRAST MRA HEAD WITHOUT CONTRAST TECHNIQUE: Multiplanar, multiecho pulse sequences of the brain and surrounding structures were obtained without intravenous contrast. Angiographic images of the head were obtained using MRA  technique without contrast. COMPARISON:  04/28/2017 FINDINGS: MRI HEAD FINDINGS Brain: Subacute right lateral medullary infarct appears more extensive than on prior. A subacute left cerebellar infarct on the previous study has decreased in restriction intensity. Cerebral cortical infarcts seen previously are no longer visualized. There are numerous small remote bilateral cerebellar infarcts. Remote lacunar infarct in the right thalamus. Atrophy with ventriculomegaly, especially of the temporal and occipital lobes. There is chronic confluent FLAIR hyperintensity in the cerebral white matter with remote lacunes in the bilateral centrum semiovale. The corpus callosum is thinned the setting of cerebral volume loss. Numerous foci of chronic lobar microhemorrhage. Vascular: Arterial findings below. Normal dural venous sinus flow voids. Skull and upper cervical spine: Negative for marrow lesion. Sinuses/Orbits: No acute finding.  Bilateral cataract resection. MRA HEAD FINDINGS Solitary visible left vertebral artery, stable from CTA 04/28/2017. The right vertebral artery is hypoplastic and may be occluded at the V4 segment. Mild proximal basilar atheromatous type narrowing. The right P1 segment is hypoplastic. Right P2 segment is attenuated and appears diffusely irregular, apparently new from prior but potentially artifactual on this motion degraded study. No reversible flow limiting stenosis in the anterior  circulation. There is moderate irregularity of bilateral MCA branches, again potentially related to artifact rather than atherosclerosis. IMPRESSION: 1. Subacute right lateral medullary infarct with questionable increased extent compared to 04/28/2017 MRI. Subacute infarcts in the left cerebellum and bilateral cerebral cortex are fading. No new site of ischemia; no hemorrhagic conversion. 2. Motion degraded MRA which may account for apparent newly narrowed bilateral M2 and right P2 segments when compared to CTA 04/30/2017. 3. Advanced atrophy and chronic white matter disease. Remote lobar micro hemorrhages may reflect underlying amyloid angiography. Electronically Signed   By: Monte Fantasia M.D.   On: 05/07/2017 10:31   Mr Brain Wo Contrast  Result Date: 05/07/2017 CLINICAL DATA:  Hypothermia. Altered mental status. History of dementia. EXAM: MRI HEAD WITHOUT CONTRAST MRA HEAD WITHOUT CONTRAST TECHNIQUE: Multiplanar, multiecho pulse sequences of the brain and surrounding structures were obtained without intravenous contrast. Angiographic images of the head were obtained using MRA technique without contrast. COMPARISON:  04/28/2017 FINDINGS: MRI HEAD FINDINGS Brain: Subacute right lateral medullary infarct appears more extensive than on prior. A subacute left cerebellar infarct on the previous study has decreased in restriction intensity. Cerebral cortical infarcts seen previously are no longer visualized. There are numerous small remote bilateral cerebellar infarcts. Remote lacunar infarct in the right thalamus. Atrophy with ventriculomegaly, especially of the temporal and occipital lobes. There is chronic confluent FLAIR hyperintensity in the cerebral white matter with remote lacunes in the bilateral centrum semiovale. The corpus callosum is thinned the setting of cerebral volume loss. Numerous foci of chronic lobar microhemorrhage. Vascular: Arterial findings below. Normal dural venous sinus flow voids. Skull  and upper cervical spine: Negative for marrow lesion. Sinuses/Orbits: No acute finding.  Bilateral cataract resection. MRA HEAD FINDINGS Solitary visible left vertebral artery, stable from CTA 04/28/2017. The right vertebral artery is hypoplastic and may be occluded at the V4 segment. Mild proximal basilar atheromatous type narrowing. The right P1 segment is hypoplastic. Right P2 segment is attenuated and appears diffusely irregular, apparently new from prior but potentially artifactual on this motion degraded study. No reversible flow limiting stenosis in the anterior circulation. There is moderate irregularity of bilateral MCA branches, again potentially related to artifact rather than atherosclerosis. IMPRESSION: 1. Subacute right lateral medullary infarct with questionable increased extent compared to 04/28/2017 MRI. Subacute infarcts in the left cerebellum and  bilateral cerebral cortex are fading. No new site of ischemia; no hemorrhagic conversion. 2. Motion degraded MRA which may account for apparent newly narrowed bilateral M2 and right P2 segments when compared to CTA 04/30/2017. 3. Advanced atrophy and chronic white matter disease. Remote lobar micro hemorrhages may reflect underlying amyloid angiography. Electronically Signed   By: Monte Fantasia M.D.   On: 05/07/2017 10:31   Mr Brain Wo Contrast  Result Date: 04/28/2017 CLINICAL DATA:  Found unresponsive. Syncope and collapse. Prior stroke. EXAM: MRI HEAD WITHOUT CONTRAST TECHNIQUE: Multiplanar, multiecho pulse sequences of the brain and surrounding structures were obtained without intravenous contrast. COMPARISON:  Head CT 04/27/2017 and MRI 03/30/2015 FINDINGS: Brain: Small acute infarcts are present in the right medulla and posteroinferior left cerebellar hemisphere. There are also punctate acute cortical infarcts in the high posterior frontal lobes bilaterally. Scattered chronic cerebral and cerebellar microhemorrhages are again seen. A small  chronic right occipital cortical infarct is new or larger than on the prior MRI. There also may be a tiny left occipital chronic cortical infarct. Patchy to confluent cerebral white matter T2 hyperintensities are similar to the prior MRI and nonspecific but compatible with extensive chronic small vessel ischemic disease. There are numerous small chronic infarcts in the cerebellum bilaterally. Chronic lacunar infarcts are also again seen in the anterior left centrum semiovale and right thalamus. There is marked cerebral atrophy. No mass, midline shift, or extra-axial fluid collection is seen. Vascular: Unchanged poor visualization of the distal right vertebral artery, possibly occluded. Dominant appearing left vertebral artery with grossly preserved vascular flow voids elsewhere. Skull and upper cervical spine: Unremarkable bone marrow signal. Sinuses/Orbits: Bilateral cataract extraction. Paranasal sinuses and mastoid air cells are clear. Other: None. IMPRESSION: 1. 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 2. Advanced cerebral atrophy and chronic small vessel ischemic disease with chronic infarcts as above. Electronically Signed   By: Logan Bores M.D.   On: 04/28/2017 12:13   US Carotid Bilateral (at [facility] Only)  Result Date: 04/29/2017 CLINICAL DATA:  Stroke.  Syncopal episode. EXAM: BILATERAL CAROTID DUPLEX ULTRASOUND TECHNIQUE: Pearline Cables scale imaging, color Doppler and duplex ultrasound were performed of bilateral carotid and vertebral arteries in the neck. COMPARISON:  None. FINDINGS: Criteria: Quantification of carotid stenosis is based on velocity parameters that correlate the residual internal carotid diameter with NASCET-based stenosis levels, using the diameter of the distal internal carotid lumen as the denominator for stenosis measurement. The following velocity measurements were obtained: RIGHT ICA:  45/6 cm/sec CCA:  086/76 cm/sec SYSTOLIC ICA/CCA RATIO:   0.4 DIASTOLIC ICA/CCA RATIO:  0.4 ECA:  105 cm/sec LEFT ICA:  78/6 cm/sec CCA:  195/09 cm/sec SYSTOLIC ICA/CCA RATIO:  0.7 DIASTOLIC ICA/CCA RATIO:  0.6 ECA:  102 cm/sec RIGHT CAROTID ARTERY: There is a very minimal amount of intimal thickening within the right carotid bulb (image 17)), extending to involve the origin and proximal aspects of the right internal carotid artery (image 25), not resulting in elevated peak systolic velocities within the interrogated course the right internal carotid artery to suggest a hemodynamically significant stenosis. RIGHT VERTEBRAL ARTERY:  Antegrade flow LEFT CAROTID ARTERY: There is a moderate amount of eccentric mixed echogenic plaque within the left carotid bulb (images 49 and 51), not resulting in elevated peak systolic velocities within the interrogated course of the left internal carotid artery to suggest a hemodynamically significant stenosis. LEFT VERTEBRAL ARTERY:  Antegrade flow IMPRESSION: 1. Moderate amount of left-sided atherosclerotic plaque, not  resulting in a hemodynamically significant stenosis. 2. Minimal amount of right-sided intimal wall thickening, not resulting in a hemodynamically significant stenosis. Electronically Signed   By: Sandi Mariscal M.D.   On: 04/29/2017 11:56   Dg Chest Port 1 View  Result Date: 05/08/2017 CLINICAL DATA:  Rhonchi.  Congestion. EXAM: PORTABLE CHEST 1 VIEW COMPARISON:  05/06/2017 FINDINGS: Upright AP view of the chest was obtained. There is no focal airspace disease or pulmonary edema. There is a skin fold overlying the lateral right chest. Negative for a pneumothorax. Heart and mediastinum are with normal limits and stable. No acute bone abnormality. IMPRESSION: No active disease. Electronically Signed   By: Markus Daft M.D.   On: 05/08/2017 10:58   Dg Chest Port 1 View  Result Date: 05/06/2017 CLINICAL DATA:  Hypoxia EXAM: PORTABLE CHEST 1 VIEW COMPARISON:  April 27, 2017 FINDINGS: The heart size and mediastinal contours are  stable. The aorta is tortuous. Both lungs are clear. Degenerative joint changes of the shoulders are noted. IMPRESSION: No active cardiopulmonary disease. Electronically Signed   By: Abelardo Diesel M.D.   On: 05/06/2017 18:18   Dg Swallowing Func-speech Pathology  Result Date: 05/10/2017 Clinical Impression             Pt currently with a moderate- severe oropharyngeal/ cervical esophageal phase dysphagia. Oral phase of swallow is disorganized with tongue pumping, piecemeal swallowing, delayed oral transit, and premature spill to the level of the pyriform sinuses. Pt also with reduced base of tongue retraction and occasional reduced airway closure resulting in silent aspiration of nectar-thick liquids- trace with teaspoon sip, frank with straw sips; pt able to clear most of aspirated material when cued to clear throat. Pt also with decreased UES opening and moderate-severe residuals in the pharynx; pt had hiccups during the study and there was backflow from the upper esophagus into the pharynx as well. Suspect that pt aspirated some of the residuals/ backflow as pt developed a wet vocal quality during the study; pt also expectorated significant amounts of what appeared to be barium mixed with saliva throughout the study. Pt appeared to have greater success with puree bolus, likely from the increased sensory input with a heavier material which resulted in fewer residuals and no aspiration of puree material was observed. The quality of the study was also limited by the pt's positioning (head leaning on wall, shoulder obstructing view at times). Aspiration risk is high at this time. If plan is to continue with PO intake, the safest would be puree and pudding-thick liquids, meds crushed in puree with oral care before and after meal. Would highly recommend for the study to be repeated as the pt's hiccups appeared to increase backflow from the esophagus; additionally pt seemed more lethargic compared to visit this  morning. Will continue to follow.  Swallow Evaluation Recommendations      SLP Diet Recommendations: NPO vs Dysphagia 1 (Puree) solids;Pudding thick liquid      Medication Administration: Crushed with puree   Supervision: Full assist for feeding;Full supervision/cueing for compensatory strategies   Compensations: Minimize environmental distractions;Slow rate;Small sips/bites   Postural Changes: Remain semi-upright after after feeds/meals (Comment);Seated upright at 90 degrees   Oral Care Recommendations: Oral care before and after PO       Amy Dionicia Abler, MA, CCC-SLP 05/10/2017,1:37 PM [phone]   Electronically signed by Kern Reap, CCC-SLP at 05/10/2017 1:45 PM Electronically signed by Kern Reap, CCC-SLP at 05/10/2017 1:48 PM  Discharge Exam: Vitals:   05/13/17 0551 05/13/17 1417  BP: 123/60 136/71  Pulse: 66 (!) 58  Resp: 16 18  Temp: 99.5 F (37.5 C) 98.1 F (36.7 C)   Vitals:   05/12/17 2130 05/13/17 0049 05/13/17 0551 05/13/17 1417  BP: 138/70  123/60 136/71  Pulse: 67  66 (!) 58  Resp: 16  16 18   Temp: 99.3 F (37.4 C) (!) 97.5 F (36.4 C) 99.5 F (37.5 C) 98.1 F (36.7 C)  TempSrc: Oral Oral Oral Axillary  SpO2: 100%  98% 99%  Weight:      Height:        General: Pt is alert, awake, not in acute distress Cardiovascular: RRR, S1/S2 +, no rubs, no gallops Respiratory: CTA bilaterally, no wheezing, no rhonchi Abdominal: Soft, NT, ND, bowel sounds + Extremities: no edema, no cyanosis   The results of significant diagnostics from this hospitalization (including imaging, microbiology, ancillary and laboratory) are listed below for reference.    Significant Diagnostic Studies: Ct Angio Head W Or Wo Contrast  Result Date: 04/30/2017 CLINICAL DATA:  Acute encephalopathy and syncopal episode. Multiple small acute brainstem and posterior fossa infarcts. EXAM: CT ANGIOGRAPHY HEAD AND NECK TECHNIQUE: Multidetector CT imaging of the head and neck was  performed using the standard protocol during bolus administration of intravenous contrast. Multiplanar CT image reconstructions and MIPs were obtained to evaluate the vascular anatomy. Carotid stenosis measurements (when applicable) are obtained utilizing NASCET criteria, using the distal internal carotid diameter as the denominator. CONTRAST:  50 mL Isovue 370 COMPARISON:  Brain MRI 04/28/2017 FINDINGS: CT HEAD FINDINGS Brain: No hemorrhage or mass effect. There is faint hypoattenuation at the sites of diffusion restriction described on recent MRI. There is periventricular hypoattenuation compatible with chronic microvascular disease. Vascular: No hyperdense vessel or unexpected calcification. Skull: Normal visualized skull base, calvarium and extracranial soft tissues. Sinuses/Orbits: No sinus fluid levels or advanced mucosal thickening. No mastoid effusion. Normal orbits. CTA NECK FINDINGS Aortic arch: There is no aneurysm or dissection of the visualized ascending aorta or aortic arch. There is a normal 3 vessel branching pattern. The visualized proximal subclavian arteries are normal. Right carotid system: The right common carotid origin is widely patent. There is no common carotid or internal carotid artery dissection or aneurysm. No hemodynamically significant stenosis. Left carotid system: The left common carotid origin is widely patent. There is no common carotid or internal carotid artery dissection or aneurysm. No hemodynamically significant stenosis. Vertebral arteries: The vertebral system is left dominant. There is calcification in the left P1 segment. The right vertebral artery is diminutive along its entire course. There is no flow demonstrated in the right V4 segment. Skeleton: There is no bony spinal canal stenosis. No lytic or blastic lesions. Other neck: The nasopharynx is clear. The oropharynx and hypopharynx are normal. The epiglottis is normal. The supraglottic larynx, glottis and subglottic  larynx are normal. No retropharyngeal collection. The parapharyngeal spaces are preserved. The parotid and submandibular glands are normal. No sialolithiasis or salivary ductal dilatation. The thyroid gland is normal. There is no cervical lymphadenopathy. Upper chest: There is ground-glass opacity in the posterior right upper lobe. Small bilateral pleural effusions with associated atelectasis. Review of the MIP images confirms the above findings CTA HEAD FINDINGS Anterior circulation: --Intracranial internal carotid arteries: Normal. --Anterior cerebral arteries: Normal. --Middle cerebral arteries: Normal. --Posterior communicating arteries: Present on the right. Posterior circulation: --Posterior cerebral arteries: Normal. --Superior cerebellar arteries: Normal. --Basilar artery: Normal. --Anterior inferior cerebellar arteries: Normal. --  Posterior inferior cerebellar arteries: Left dominant. Venous sinuses: As permitted by contrast timing, patent. Anatomic variants: None Delayed phase: No parenchymal contrast enhancement. Review of the MIP images confirms the above findings IMPRESSION: 1. No emergent large vessel occlusion. 2. No hemodynamically significant stenosis of the major cervical arteries. 3. Congenitally diminutive right vertebral artery. Electronically Signed   By: Ulyses Jarred M.D.   On: 04/30/2017 21:22   Dg Chest 1 View  Result Date: 04/27/2017 CLINICAL DATA:  Found unresponsive. EXAM: CHEST 1 VIEW COMPARISON:  Chest x-ray dated 12/25/2007. FINDINGS: Heart size is upper normal. Overall cardiomediastinal silhouette is stable in size and configuration. Lungs are clear. No pleural effusion or pneumothorax seen. Osseous and soft tissue structures about the chest are unremarkable. IMPRESSION: No active disease. Electronically Signed   By: Franki Cabot M.D.   On: 04/27/2017 20:50   Ct Head Wo Contrast  Result Date: 05/06/2017 CLINICAL DATA:  Hypothermia.  Altered mental status. EXAM: CT HEAD WITHOUT  CONTRAST TECHNIQUE: Contiguous axial images were obtained from the base of the skull through the vertex without intravenous contrast. COMPARISON:  Head CT 04/30/2017, 04/27/2017.  Brain MRI 04/28/2017 FINDINGS: Brain: No acute intracranial hemorrhage. Small acute infarcts on prior MRI are not well seen by CT. Stable degree of atrophy and chronic small vessel ischemia. Remote lacunar infarcts in the right thalamus. No extra-axial fluid collection. Vascular: Atherosclerosis of skullbase vasculature without hyperdense vessel or abnormal calcification. Skull: No fracture or focal lesion. Sinuses/Orbits: Paranasal sinuses and mastoid air cells are clear. The visualized orbits are unremarkable. Bilateral cataract resection. Other: None. IMPRESSION: No acute abnormality. Stable degree of atrophy and chronic small vessel ischemia. Recent small infarcts on MRI are not well seen by CT. Electronically Signed   By: Jeb Levering M.D.   On: 05/06/2017 20:36   Ct Head Wo Contrast  Result Date: 04/27/2017 CLINICAL DATA:  Found unresponsive earlier today. Low blood pressure. Partial resuscitation. EXAM: CT HEAD WITHOUT CONTRAST TECHNIQUE: Contiguous axial images were obtained from the base of the skull through the vertex without intravenous contrast. COMPARISON:  03/30/2015 brain MR. FINDINGS: Brain: No evidence for acute stroke, acute hemorrhage, mass lesion, or extra-axial fluid. There is extreme atrophy, with hydrocephalus ex vacuo. Extensive involvement of the white matter by hypoattenuation, representing chronic microvascular ischemic change. There is an old RIGHT thalamus lacunar infarct. Vascular: No hyperdense vessel or unexpected calcification. Skull: Normal. Negative for fracture or focal lesion. Sinuses/Orbits: No acute findings or layering fluid. BILATERAL cataract extraction. Other: None.  Compared with prior brain MR, similar appearance. IMPRESSION: Severe atrophy and extensive white matter disease. Evidence  for chronic cerebral infarction. No acute intracranial findings are evident. Electronically Signed   By: Staci Righter M.D.   On: 04/27/2017 20:22   Ct Angio Neck W Or Wo Contrast  Result Date: 04/30/2017 CLINICAL DATA:  Acute encephalopathy and syncopal episode. Multiple small acute brainstem and posterior fossa infarcts. EXAM: CT ANGIOGRAPHY HEAD AND NECK TECHNIQUE: Multidetector CT imaging of the head and neck was performed using the standard protocol during bolus administration of intravenous contrast. Multiplanar CT image reconstructions and MIPs were obtained to evaluate the vascular anatomy. Carotid stenosis measurements (when applicable) are obtained utilizing NASCET criteria, using the distal internal carotid diameter as the denominator. CONTRAST:  50 mL Isovue 370 COMPARISON:  Brain MRI 04/28/2017 FINDINGS: CT HEAD FINDINGS Brain: No hemorrhage or mass effect. There is faint hypoattenuation at the sites of diffusion restriction described on recent MRI. There is periventricular hypoattenuation compatible  with chronic microvascular disease. Vascular: No hyperdense vessel or unexpected calcification. Skull: Normal visualized skull base, calvarium and extracranial soft tissues. Sinuses/Orbits: No sinus fluid levels or advanced mucosal thickening. No mastoid effusion. Normal orbits. CTA NECK FINDINGS Aortic arch: There is no aneurysm or dissection of the visualized ascending aorta or aortic arch. There is a normal 3 vessel branching pattern. The visualized proximal subclavian arteries are normal. Right carotid system: The right common carotid origin is widely patent. There is no common carotid or internal carotid artery dissection or aneurysm. No hemodynamically significant stenosis. Left carotid system: The left common carotid origin is widely patent. There is no common carotid or internal carotid artery dissection or aneurysm. No hemodynamically significant stenosis. Vertebral arteries: The vertebral  system is left dominant. There is calcification in the left P1 segment. The right vertebral artery is diminutive along its entire course. There is no flow demonstrated in the right V4 segment. Skeleton: There is no bony spinal canal stenosis. No lytic or blastic lesions. Other neck: The nasopharynx is clear. The oropharynx and hypopharynx are normal. The epiglottis is normal. The supraglottic larynx, glottis and subglottic larynx are normal. No retropharyngeal collection. The parapharyngeal spaces are preserved. The parotid and submandibular glands are normal. No sialolithiasis or salivary ductal dilatation. The thyroid gland is normal. There is no cervical lymphadenopathy. Upper chest: There is ground-glass opacity in the posterior right upper lobe. Small bilateral pleural effusions with associated atelectasis. Review of the MIP images confirms the above findings CTA HEAD FINDINGS Anterior circulation: --Intracranial internal carotid arteries: Normal. --Anterior cerebral arteries: Normal. --Middle cerebral arteries: Normal. --Posterior communicating arteries: Present on the right. Posterior circulation: --Posterior cerebral arteries: Normal. --Superior cerebellar arteries: Normal. --Basilar artery: Normal. --Anterior inferior cerebellar arteries: Normal. --Posterior inferior cerebellar arteries: Left dominant. Venous sinuses: As permitted by contrast timing, patent. Anatomic variants: None Delayed phase: No parenchymal contrast enhancement. Review of the MIP images confirms the above findings IMPRESSION: 1. No emergent large vessel occlusion. 2. No hemodynamically significant stenosis of the major cervical arteries. 3. Congenitally diminutive right vertebral artery. Electronically Signed   By: Ulyses Jarred M.D.   On: 04/30/2017 21:22   Mr Jodene Nam Head Wo Contrast  Result Date: 05/07/2017 CLINICAL DATA:  Hypothermia. Altered mental status. History of dementia. EXAM: MRI HEAD WITHOUT CONTRAST MRA HEAD WITHOUT  CONTRAST TECHNIQUE: Multiplanar, multiecho pulse sequences of the brain and surrounding structures were obtained without intravenous contrast. Angiographic images of the head were obtained using MRA technique without contrast. COMPARISON:  04/28/2017 FINDINGS: MRI HEAD FINDINGS Brain: Subacute right lateral medullary infarct appears more extensive than on prior. A subacute left cerebellar infarct on the previous study has decreased in restriction intensity. Cerebral cortical infarcts seen previously are no longer visualized. There are numerous small remote bilateral cerebellar infarcts. Remote lacunar infarct in the right thalamus. Atrophy with ventriculomegaly, especially of the temporal and occipital lobes. There is chronic confluent FLAIR hyperintensity in the cerebral white matter with remote lacunes in the bilateral centrum semiovale. The corpus callosum is thinned the setting of cerebral volume loss. Numerous foci of chronic lobar microhemorrhage. Vascular: Arterial findings below. Normal dural venous sinus flow voids. Skull and upper cervical spine: Negative for marrow lesion. Sinuses/Orbits: No acute finding.  Bilateral cataract resection. MRA HEAD FINDINGS Solitary visible left vertebral artery, stable from CTA 04/28/2017. The right vertebral artery is hypoplastic and may be occluded at the V4 segment. Mild proximal basilar atheromatous type narrowing. The right P1 segment is hypoplastic. Right P2 segment is  attenuated and appears diffusely irregular, apparently new from prior but potentially artifactual on this motion degraded study. No reversible flow limiting stenosis in the anterior circulation. There is moderate irregularity of bilateral MCA branches, again potentially related to artifact rather than atherosclerosis. IMPRESSION: 1. Subacute right lateral medullary infarct with questionable increased extent compared to 04/28/2017 MRI. Subacute infarcts in the left cerebellum and bilateral cerebral  cortex are fading. No new site of ischemia; no hemorrhagic conversion. 2. Motion degraded MRA which may account for apparent newly narrowed bilateral M2 and right P2 segments when compared to CTA 04/30/2017. 3. Advanced atrophy and chronic white matter disease. Remote lobar micro hemorrhages may reflect underlying amyloid angiography. Electronically Signed   By: Monte Fantasia M.D.   On: 05/07/2017 10:31   Mr Brain Wo Contrast  Result Date: 05/07/2017 CLINICAL DATA:  Hypothermia. Altered mental status. History of dementia. EXAM: MRI HEAD WITHOUT CONTRAST MRA HEAD WITHOUT CONTRAST TECHNIQUE: Multiplanar, multiecho pulse sequences of the brain and surrounding structures were obtained without intravenous contrast. Angiographic images of the head were obtained using MRA technique without contrast. COMPARISON:  04/28/2017 FINDINGS: MRI HEAD FINDINGS Brain: Subacute right lateral medullary infarct appears more extensive than on prior. A subacute left cerebellar infarct on the previous study has decreased in restriction intensity. Cerebral cortical infarcts seen previously are no longer visualized. There are numerous small remote bilateral cerebellar infarcts. Remote lacunar infarct in the right thalamus. Atrophy with ventriculomegaly, especially of the temporal and occipital lobes. There is chronic confluent FLAIR hyperintensity in the cerebral white matter with remote lacunes in the bilateral centrum semiovale. The corpus callosum is thinned the setting of cerebral volume loss. Numerous foci of chronic lobar microhemorrhage. Vascular: Arterial findings below. Normal dural venous sinus flow voids. Skull and upper cervical spine: Negative for marrow lesion. Sinuses/Orbits: No acute finding.  Bilateral cataract resection. MRA HEAD FINDINGS Solitary visible left vertebral artery, stable from CTA 04/28/2017. The right vertebral artery is hypoplastic and may be occluded at the V4 segment. Mild proximal basilar atheromatous  type narrowing. The right P1 segment is hypoplastic. Right P2 segment is attenuated and appears diffusely irregular, apparently new from prior but potentially artifactual on this motion degraded study. No reversible flow limiting stenosis in the anterior circulation. There is moderate irregularity of bilateral MCA branches, again potentially related to artifact rather than atherosclerosis. IMPRESSION: 1. Subacute right lateral medullary infarct with questionable increased extent compared to 04/28/2017 MRI. Subacute infarcts in the left cerebellum and bilateral cerebral cortex are fading. No new site of ischemia; no hemorrhagic conversion. 2. Motion degraded MRA which may account for apparent newly narrowed bilateral M2 and right P2 segments when compared to CTA 04/30/2017. 3. Advanced atrophy and chronic white matter disease. Remote lobar micro hemorrhages may reflect underlying amyloid angiography. Electronically Signed   By: Monte Fantasia M.D.   On: 05/07/2017 10:31   Mr Brain Wo Contrast  Result Date: 04/28/2017 CLINICAL DATA:  Found unresponsive. Syncope and collapse. Prior stroke. EXAM: MRI HEAD WITHOUT CONTRAST TECHNIQUE: Multiplanar, multiecho pulse sequences of the brain and surrounding structures were obtained without intravenous contrast. COMPARISON:  Head CT 04/27/2017 and MRI 03/30/2015 FINDINGS: Brain: Small acute infarcts are present in the right medulla and posteroinferior left cerebellar hemisphere. There are also punctate acute cortical infarcts in the high posterior frontal lobes bilaterally. Scattered chronic cerebral and cerebellar microhemorrhages are again seen. A small chronic right occipital cortical infarct is new or larger than on the prior MRI. There also may be a  tiny left occipital chronic cortical infarct. Patchy to confluent cerebral white matter T2 hyperintensities are similar to the prior MRI and nonspecific but compatible with extensive chronic small vessel ischemic disease.  There are numerous small chronic infarcts in the cerebellum bilaterally. Chronic lacunar infarcts are also again seen in the anterior left centrum semiovale and right thalamus. There is marked cerebral atrophy. No mass, midline shift, or extra-axial fluid collection is seen. Vascular: Unchanged poor visualization of the distal right vertebral artery, possibly occluded. Dominant appearing left vertebral artery with grossly preserved vascular flow voids elsewhere. Skull and upper cervical spine: Unremarkable bone marrow signal. Sinuses/Orbits: Bilateral cataract extraction. Paranasal sinuses and mastoid air cells are clear. Other: None. IMPRESSION: 1. 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 2. Advanced cerebral atrophy and chronic small vessel ischemic disease with chronic infarcts as above. Electronically Signed   By: Logan Bores M.D.   On: 04/28/2017 12:13   US Carotid Bilateral (at [facility] And [facility] Only)  Result Date: 04/29/2017 CLINICAL DATA:  Stroke.  Syncopal episode. EXAM: BILATERAL CAROTID DUPLEX ULTRASOUND TECHNIQUE: Pearline Cables scale imaging, color Doppler and duplex ultrasound were performed of bilateral carotid and vertebral arteries in the neck. COMPARISON:  None. FINDINGS: Criteria: Quantification of carotid stenosis is based on velocity parameters that correlate the residual internal carotid diameter with NASCET-based stenosis levels, using the diameter of the distal internal carotid lumen as the denominator for stenosis measurement. The following velocity measurements were obtained: RIGHT ICA:  45/6 cm/sec CCA:  109/32 cm/sec SYSTOLIC ICA/CCA RATIO:  0.4 DIASTOLIC ICA/CCA RATIO:  0.4 ECA:  105 cm/sec LEFT ICA:  78/6 cm/sec CCA:  355/73 cm/sec SYSTOLIC ICA/CCA RATIO:  0.7 DIASTOLIC ICA/CCA RATIO:  0.6 ECA:  102 cm/sec RIGHT CAROTID ARTERY: There is a very minimal amount of intimal thickening within the right carotid bulb (image 17)), extending to involve the origin  and proximal aspects of the right internal carotid artery (image 25), not resulting in elevated peak systolic velocities within the interrogated course the right internal carotid artery to suggest a hemodynamically significant stenosis. RIGHT VERTEBRAL ARTERY:  Antegrade flow LEFT CAROTID ARTERY: There is a moderate amount of eccentric mixed echogenic plaque within the left carotid bulb (images 49 and 51), not resulting in elevated peak systolic velocities within the interrogated course of the left internal carotid artery to suggest a hemodynamically significant stenosis. LEFT VERTEBRAL ARTERY:  Antegrade flow IMPRESSION: 1. Moderate amount of left-sided atherosclerotic plaque, not resulting in a hemodynamically significant stenosis. 2. Minimal amount of right-sided intimal wall thickening, not resulting in a hemodynamically significant stenosis. Electronically Signed   By: Sandi Mariscal M.D.   On: 04/29/2017 11:56   Dg Chest Port 1 View  Result Date: 05/08/2017 CLINICAL DATA:  Rhonchi.  Congestion. EXAM: PORTABLE CHEST 1 VIEW COMPARISON:  05/06/2017 FINDINGS: Upright AP view of the chest was obtained. There is no focal airspace disease or pulmonary edema. There is a skin fold overlying the lateral right chest. Negative for a pneumothorax. Heart and mediastinum are with normal limits and stable. No acute bone abnormality. IMPRESSION: No active disease. Electronically Signed   By: Markus Daft M.D.   On: 05/08/2017 10:58   Dg Chest Port 1 View  Result Date: 05/06/2017 CLINICAL DATA:  Hypoxia EXAM: PORTABLE CHEST 1 VIEW COMPARISON:  April 27, 2017 FINDINGS: The heart size and mediastinal contours are stable. The aorta is tortuous. Both lungs are clear. Degenerative joint changes of the shoulders are noted. IMPRESSION: No  active cardiopulmonary disease. Electronically Signed   By: Abelardo Diesel M.D.   On: 05/06/2017 18:18   Dg Swallowing Func-speech Pathology  Result Date: 05/10/2017 Clinical Impression              Pt currently with a moderate- severe oropharyngeal/ cervical esophageal phase dysphagia. Oral phase of swallow is disorganized with tongue pumping, piecemeal swallowing, delayed oral transit, and premature spill to the level of the pyriform sinuses. Pt also with reduced base of tongue retraction and occasional reduced airway closure resulting in silent aspiration of nectar-thick liquids- trace with teaspoon sip, frank with straw sips; pt able to clear most of aspirated material when cued to clear throat. Pt also with decreased UES opening and moderate-severe residuals in the pharynx; pt had hiccups during the study and there was backflow from the upper esophagus into the pharynx as well. Suspect that pt aspirated some of the residuals/ backflow as pt developed a wet vocal quality during the study; pt also expectorated significant amounts of what appeared to be barium mixed with saliva throughout the study. Pt appeared to have greater success with puree bolus, likely from the increased sensory input with a heavier material which resulted in fewer residuals and no aspiration of puree material was observed. The quality of the study was also limited by the pt's positioning (head leaning on wall, shoulder obstructing view at times). Aspiration risk is high at this time. If plan is to continue with PO intake, the safest would be puree and pudding-thick liquids, meds crushed in puree with oral care before and after meal. Would highly recommend for the study to be repeated as the pt's hiccups appeared to increase backflow from the esophagus; additionally pt seemed more lethargic compared to visit this morning. Will continue to follow.  Swallow Evaluation Recommendations      SLP Diet Recommendations: NPO vs Dysphagia 1 (Puree) solids;Pudding thick liquid      Medication Administration: Crushed with puree   Supervision: Full assist for feeding;Full supervision/cueing for compensatory strategies   Compensations:  Minimize environmental distractions;Slow rate;Small sips/bites   Postural Changes: Remain semi-upright after after feeds/meals (Comment);Seated upright at 90 degrees   Oral Care Recommendations: Oral care before and after PO       Amy Dionicia Abler, MA, CCC-SLP 05/10/2017,1:37 PM [phone]   Electronically signed by Kern Reap, CCC-SLP at 05/10/2017 1:45 PM Electronically signed by Kern Reap, CCC-SLP at 05/10/2017 1:48 PM      Microbiology: Recent Results (from the past 240 hour(s))  Blood Culture (routine x 2)     Status: None   Collection Time: 05/06/17  5:56 PM  Result Value Ref Range Status   Specimen Description BLOOD RIGHT HAND  Final   Special Requests   Final    BOTTLES DRAWN AEROBIC AND ANAEROBIC Blood Culture adequate volume   Culture NO GROWTH 5 DAYS  Final   Report Status 05/11/2017 FINAL  Final  Blood Culture (routine x 2)     Status: None   Collection Time: 05/06/17  6:00 PM  Result Value Ref Range Status   Specimen Description BLOOD LEFT ARM  Final   Special Requests   Final    BOTTLES DRAWN AEROBIC AND ANAEROBIC Blood Culture adequate volume   Culture NO GROWTH 5 DAYS  Final   Report Status 05/11/2017 FINAL  Final  MRSA PCR Screening     Status: None   Collection Time: 05/06/17 11:10 PM  Result Value Ref Range Status   MRSA  by PCR NEGATIVE NEGATIVE Final    Comment:        The GeneXpert MRSA Assay (FDA approved for NASAL specimens only), is one component of a comprehensive MRSA colonization surveillance program. It is not intended to diagnose MRSA infection nor to guide or monitor treatment for MRSA infections.   Culture, blood (Routine X 2) w Reflex to ID Panel     Status: None (Preliminary result)   Collection Time: 05/12/17 12:26 PM  Result Value Ref Range Status   Specimen Description   Final    BLOOD RIGHT ANTECUBITAL BOTTLES DRAWN AEROBIC AND ANAEROBIC   Special Requests   Final    Blood Culture results may not be optimal due to an inadequate  volume of blood received in culture bottles   Culture NO GROWTH 1 DAY  Final   Report Status PENDING  Incomplete  Culture, blood (Routine X 2) w Reflex to ID Panel     Status: None (Preliminary result)   Collection Time: 05/12/17 12:34 PM  Result Value Ref Range Status   Specimen Description   Final    BLOOD BLOOD RIGHT WRIST BOTTLES DRAWN AEROBIC AND ANAEROBIC   Special Requests Blood Culture adequate volume  Final   Culture  Setup Time   Final    GRAM POSITIVE COCCI Gram Stain Report Called to,Read Back By and Verified With: ELLINGTON,M @ 0900 ON 7.9.18 BY BOWMAN,L ANAEROBIC BOTTLE ONLY CRITICAL RESULT CALLED TO, READ BACK BY AND VERIFIED WITH: L SEAY, PHARMD 05/13/17 1515 L CHAMPION    Culture GRAM POSITIVE COCCI  Final   Report Status PENDING  Incomplete  Blood Culture ID Panel (Reflexed)     Status: Abnormal   Collection Time: 05/12/17 12:34 PM  Result Value Ref Range Status   Enterococcus species NOT DETECTED NOT DETECTED Final   Listeria monocytogenes NOT DETECTED NOT DETECTED Final   Staphylococcus species DETECTED (A) NOT DETECTED Final    Comment: Methicillin (oxacillin) resistant coagulase negative staphylococcus. Possible blood culture contaminant (unless isolated from more than one blood culture draw or clinical case suggests pathogenicity). No antibiotic treatment is indicated for blood  culture contaminants. CRITICAL RESULT CALLED TO, READ BACK BY AND VERIFIED WITH: L SEAY, PHARMD 05/13/17 1515 L CHAMPION    Staphylococcus aureus NOT DETECTED NOT DETECTED Final   Methicillin resistance DETECTED (A) NOT DETECTED Final    Comment: CRITICAL RESULT CALLED TO, READ BACK BY AND VERIFIED WITH: L SEAY, PHARMD 05/13/17 1515 L CHAMPION    Streptococcus species NOT DETECTED NOT DETECTED Final   Streptococcus agalactiae NOT DETECTED NOT DETECTED Final   Streptococcus pneumoniae NOT DETECTED NOT DETECTED Final   Streptococcus pyogenes NOT DETECTED NOT DETECTED Final   Acinetobacter  baumannii NOT DETECTED NOT DETECTED Final   Enterobacteriaceae species NOT DETECTED NOT DETECTED Final   Enterobacter cloacae complex NOT DETECTED NOT DETECTED Final   Escherichia coli NOT DETECTED NOT DETECTED Final   Klebsiella oxytoca NOT DETECTED NOT DETECTED Final   Klebsiella pneumoniae NOT DETECTED NOT DETECTED Final   Proteus species NOT DETECTED NOT DETECTED Final   Serratia marcescens NOT DETECTED NOT DETECTED Final   Haemophilus influenzae NOT DETECTED NOT DETECTED Final   Neisseria meningitidis NOT DETECTED NOT DETECTED Final   Pseudomonas aeruginosa NOT DETECTED NOT DETECTED Final   Candida albicans NOT DETECTED NOT DETECTED Final   Candida glabrata NOT DETECTED NOT DETECTED Final   Candida krusei NOT DETECTED NOT DETECTED Final   Candida parapsilosis NOT DETECTED NOT DETECTED Final  Candida tropicalis NOT DETECTED NOT DETECTED Final     Labs: Basic Metabolic Panel:  Recent Labs Lab 05/06/17 1756 05/07/17 0300 05/08/17 0555 05/09/17 0442 05/12/17 1226 05/13/17 0730  NA 137 139 138 138 143  --   K 3.7 3.7 3.9 3.9 3.6  --   CL 104 106 102 104 110  --   CO2 27 26 27 26 26   --   GLUCOSE 112* 89 93 90 101*  --   BUN 19 14 13 12 13   --   CREATININE 0.75 0.88 1.08 1.06 1.02 1.04  CALCIUM 9.1 8.7* 8.9 8.9 9.0  --   MG  --   --  1.8  --   --   --    Liver Function Tests:  Recent Labs Lab 05/06/17 1756 05/12/17 1226  AST 44* 20  ALT 64* 36  ALKPHOS 82 67  BILITOT 0.6 1.0  PROT 6.6 6.3*  ALBUMIN 3.3* 3.0*    Recent Labs Lab 05/06/17 1756 05/12/17 1226  LIPASE 25 28    Recent Labs Lab 05/12/17 1226  AMMONIA 14   CBC:  Recent Labs Lab 05/06/17 1756 05/07/17 0300 05/09/17 0531 05/12/17 1226  WBC 6.4 5.3 8.9 12.8*  NEUTROABS 5.0  --   --   --   HGB 12.9* 11.8* 12.4* 12.0*  HCT 37.6* 34.7* 37.6* 35.9*  MCV 91.5 91.6 93.3 92.8  PLT 269 243 251 242   Cardiac Enzymes: No results for input(s): CKTOTAL, CKMB, CKMBINDEX, TROPONINI in the last  168 hours. BNP: Invalid input(s): POCBNP CBG: No results for input(s): GLUCAP in the last 168 hours.  Time coordinating discharge:  Greater than 30 minutes  Signed:  Waylynn Benefiel, DO [facility] Pager: [phone] 05/13/2017, 5:18 PM

## 2017-05-13 NOTE — Progress Notes (Signed)
Physical Therapy Treatment Patient Details Name: [name] MRN: [id number] DOB: 1932/02/23 Today's Date: 05/13/2017    History of Present Illness [name] is a 83 y.o. male with medical history significant of remote prostate CA; dementia; glaucoma; and recent admission (6/23-28) for CVAs who was discharged to SNF.  [place] called the family this afternoon, they were concerned that temperature dropped, BP was low, and he was throwing up blood.  Brother was there about 3pm and he was doing fine.  Facility called him about 430.  They did not specifiy how low BP was or how low temperature.  They also reported throwing up blood but did not quantify (Dr. Rogene Houston reports only 1 episode).  No emesis in the ER.    PT Comments    Pt is very limited in standing and bed mob due to L side neglect, but with cues and redirection of head will look L and talk about what is there.  He is fatiguing quickly in sitting up in the chair, and requires assistance for all ADL's such as his meal which arrived during therapy.  He is set to continue PT daily as much as possible, and to work on control of mobility, awareness of midline in sitting and standing and to help with transitional movement as he is able.   Follow Up Recommendations  SNF     Equipment Recommendations  None recommended by PT    Recommendations for Other Services       Precautions / Restrictions Precautions Precautions: Fall Precaution Comments: R hemiparesis with neglect of L side Restrictions Weight Bearing Restrictions: No    Mobility  Bed Mobility Overal bed mobility: Needs Assistance Bed Mobility: Supine to Sit Rolling: Min assist;Mod assist Sidelying to sit: Mod assist;Max assist Supine to sit: Mod assist;Max assist     General bed mobility comments: Pt is following limited instructions with repetition needed to focus and use L side  Transfers Overall transfer level: Needs assistance Equipment used: 1 person hand  held assist (2 needed) Transfers: Sit to/from [employer] Sit to Stand: Max assist Stand pivot transfers: Total assist;From elevated surface       General transfer comment: pt did not follow instructions consistently  Ambulation/Gait             General Gait Details: cannot step   Stairs            Wheelchair Mobility    Modified Rankin (Stroke Patients Only) Modified Rankin (Stroke Patients Only) Pre-Morbid Rankin Score: Moderate disability Modified Rankin: Severe disability     Balance Overall balance assessment: Needs assistance Sitting-balance support: Feet supported;Bilateral upper extremity supported Sitting balance-Leahy Scale: Fair Sitting balance - Comments: listing to R but can maintain with dense cues Postural control: Right lateral lean Standing balance support: During functional activity;Bilateral upper extremity supported Standing balance-Leahy Scale: Poor                              Cognition Arousal/Alertness: Lethargic (strong L side neglect) Behavior During Therapy: Flat affect Overall Cognitive Status: Impaired/Different from baseline Area of Impairment: Attention;Following commands;Safety/judgement;Awareness;Problem solving                 Orientation Level: Situation Current Attention Level: Divided Memory: Decreased recall of precautions;Decreased short-term memory Following Commands: Follows one step commands with increased time;Follows one step commands inconsistently Safety/Judgement: Decreased awareness of safety;Decreased awareness of deficits Awareness: Intellectual Problem Solving: Slow processing;Decreased  initiation;Difficulty sequencing;Requires verbal cues;Requires tactile cues        Exercises      General Comments        Pertinent Vitals/Pain Pain Assessment: No/denies pain    Home Living                      Prior Function            PT Goals (current goals  can now be found in the care plan section) Acute Rehab PT Goals Patient Stated Goal: none stated Progress towards PT goals: Progressing toward goals    Frequency    Min 5X/week      PT Plan Current plan remains appropriate    Co-evaluation              AM-PAC PT "6 Clicks" Daily Activity  Outcome Measure  Difficulty turning over in bed (including adjusting bedclothes, sheets and blankets)?: A Lot Difficulty moving from lying on back to sitting on the side of the bed? : A Lot Difficulty sitting down on and standing up from a chair with arms (e.g., wheelchair, bedside commode, etc,.)?: A Lot Help needed moving to and from a bed to chair (including a wheelchair)?: A Lot Help needed walking in hospital room?: Total Help needed climbing 3-5 steps with a railing? : Total 6 Click Score: 10    End of Session Equipment Utilized During Treatment: Gait belt Activity Tolerance: Patient tolerated treatment well;Other (comment) (limited by L side neglect) Patient left: in chair;with call bell/phone within reach;with chair alarm set;with nursing/sitter in room Nurse Communication: Mobility status;Need for lift equipment PT Visit Diagnosis: Muscle weakness (generalized) (M62.81);Difficulty in walking, not elsewhere classified (R26.2);Other abnormalities of gait and mobility (R26.89)     Time: 4818-5631 PT Time Calculation (min) (ACUTE ONLY): 32 min  Charges:  $Therapeutic Activity: 23-37 mins                    G Codes:  Functional Assessment Tool Used: AM-PAC 6 Clicks Basic Mobility     Ramond Dial 05/13/2017, 6:29 PM   6:31 PM, 05/13/17 Mee Hives, PT, MS Physical Therapist - [place] [phone] (Office)

## 2017-05-13 NOTE — Progress Notes (Signed)
Pharmacy Antibiotic Note  [name] is a 83 y.o. male admitted on 05/06/2017, now with bacteremia.  Pharmacy has been consulted for vancomycin dosing.He has gpc in 1/2 [facility]  Plan: Vancomycin 1250 mg IV X 1 then Vancomycin 1000 IV every 24 hours.  Goal trough 15-20 mcg/mL.  F/u renal function, cultures and clinical course  Height: 5\' 10"  (177.8 cm) Weight: 145 lb 1 oz (65.8 kg) IBW/kg (Calculated) : 73  Temp (24hrs), Avg:98.4 F (36.9 C), Min:97.5 F (36.4 C), Max:99.5 F (37.5 C)   Recent Labs Lab 05/06/17 1756 05/06/17 1800 05/06/17 2117 05/07/17 0005 05/07/17 0300 05/08/17 0555 05/09/17 0442 05/09/17 0531 05/12/17 1226 05/13/17 0730  WBC 6.4  --   --   --  5.3  --   --  8.9 12.8*  --   CREATININE 0.75  --   --   --  0.88 1.08 1.06  --  1.02 1.04  LATICACIDVEN  --  0.8 0.6 0.6 0.7  --   --   --  1.0  --     Estimated Creatinine Clearance: 50.1 mL/min (by C-G formula based on SCr of 1.04 mg/dL).    No Known Allergies  Antimicrobials this admission: Vanc 7/2 >> 7/3, 7/9>> Zosyn 7/2 >> 7/6  Microbiology results: 7/8 BCx: 1/2 gpc 7/2  BC X 2>>ng final 7/2 MRSA PCR: (-)  Thank you for allowing pharmacy to be a part of this patient's care.  Excell Seltzer [place] 05/13/2017 11:46 AM

## 2017-05-14 DIAGNOSIS — F0390 Unspecified dementia without behavioral disturbance: Secondary | ICD-10-CM

## 2017-05-14 LAB — CBC
HCT: 35.7 % — ABNORMAL LOW (ref 39.0–52.0)
Hemoglobin: 12 g/dL — ABNORMAL LOW (ref 13.0–17.0)
MCH: 31.2 pg (ref 26.0–34.0)
MCHC: 33.6 g/dL (ref 30.0–36.0)
MCV: 92.7 fL (ref 78.0–100.0)
PLATELETS: 255 10*3/uL (ref 150–400)
RBC: 3.85 MIL/uL — ABNORMAL LOW (ref 4.22–5.81)
RDW: 15.3 % (ref 11.5–15.5)
WBC: 5.7 10*3/uL (ref 4.0–10.5)

## 2017-05-14 LAB — BASIC METABOLIC PANEL
ANION GAP: 3 — AB (ref 5–15)
BUN: 16 mg/dL (ref 6–20)
CALCIUM: 8.9 mg/dL (ref 8.9–10.3)
CO2: 29 mmol/L (ref 22–32)
CREATININE: 0.87 mg/dL (ref 0.61–1.24)
Chloride: 109 mmol/L (ref 101–111)
GFR calc non Af Amer: >60 mL/min (ref >60)
Glucose, Bld: 84 mg/dL (ref 65–99)
Potassium: 3.7 mmol/L (ref 3.5–5.1)
SODIUM: 141 mmol/L (ref 135–145)

## 2017-05-14 LAB — URINE CULTURE: Culture: NO GROWTH

## 2017-05-14 MED ORDER — PANTOPRAZOLE SODIUM 40 MG PO TBEC: 40.0000 mg | DELAYED_RELEASE_TABLET | Freq: Every day | ORAL | 0 refills | Status: DC

## 2017-05-14 NOTE — Care Management Note (Signed)
Case Management Note  Patient Details  Name: [name] MRN: [id number] Date of Birth: [birth year]  If discussed at [place] Length of Stay Meetings, dates discussed:  05/14/2017   Sherald Barge, RN 05/14/2017, 11:45 AM

## 2017-05-14 NOTE — Progress Notes (Signed)
Report called to [place] 

## 2017-05-14 NOTE — Consult Note (Signed)
   [facility] CM Inpatient Consult   05/14/2017  [name] [birth year] [id number]   Patient screened for potential [place] Management services. Patient is on the [facility] registry as a benefit of their [employer] . Electronic medical record reveals patient's discharge plan is to return to long term care facility and there were no identifiable [facility] care management needs. [facility] Care Management services not appropriate at this time. If patient's post hospital needs change please place a [facility] Care Management consult. For questions please contact:   Kylin Dubs RN, [place] [facility] Liaison  [phone]) [employer] Mobile [phone]) Toll free office

## 2017-05-14 NOTE — Progress Notes (Signed)
Physical Therapy Treatment Patient Details Name: [name] MRN: [id number] DOB: 1932/10/20 Today's Date: 05/14/2017    History of Present Illness [name] is a 83 y.o. male with medical history significant of remote prostate CA; dementia; glaucoma; and recent admission (6/23-28) for CVAs who was discharged to SNF.  [place] called the family this afternoon, they were concerned that temperature dropped, BP was low, and he was throwing up blood.  Brother was there about 3pm and he was doing fine.  Facility called him about 430.  They did not specifiy how low BP was or how low temperature.  They also reported throwing up blood but did not quantify (Dr. Rogene Houston reports only 1 episode).  No emesis in the ER.    PT Comments    Pt was seen for evaluation of his current mobility and has demonstrated better control of his L side today.  Actively assisted to bring LLE to side of bed although this is unpredictable.  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  He is propped up with pillow to support midline then had to reassist to bed with CNA due to pending transport out of the hospital.  Will follow acutely if his DC is delayed, focusing on balance and standing control.   Follow Up Recommendations  SNF     Equipment Recommendations  None recommended by PT    Recommendations for Other Services OT consult     Precautions / Restrictions Precautions Precautions: Fall Precaution Comments: L hemiparesis with neglect Restrictions Weight Bearing Restrictions: No    Mobility  Bed Mobility Overal bed mobility: Needs Assistance Bed Mobility: Supine to Sit;Sit to Supine Rolling: Min assist;Mod assist Sidelying to sit: Mod assist Supine to sit: Mod assist Sit to supine: Mod assist   General bed mobility comments: once sitting needs continual prompting to maintain unsupported midline  Transfers Overall transfer  level: Needs assistance Equipment used: 1 person hand held assist;2 person hand held assist (2 better, using bed pad due to rapid return to bed ) Transfers: Sit to/from [employer] Sit to Stand: Max assist;Mod assist Stand pivot transfers: Mod assist;Max assist Squat pivot transfers: Mod assist;+2 physical assistance;+2 safety/equipment     General transfer comment: pt using bed pad with CNA and PT was optimal for safety for all  Ambulation/Gait             General Gait Details: cannot step   Stairs            Wheelchair Mobility    Modified Rankin (Stroke Patients Only) Modified Rankin (Stroke Patients Only) Pre-Morbid Rankin Score: Moderate disability Modified Rankin: Moderately severe disability     Balance Overall balance assessment: Needs assistance Sitting-balance support: Feet supported;Bilateral upper extremity supported Sitting balance-Leahy Scale: Poor Sitting balance - Comments: Leaning to the R and corrected to L with limited success, pt very resistant to staying there, poor midline awareness Postural control: Right lateral lean Standing balance support: Bilateral upper extremity supported;During functional activity Standing balance-Leahy Scale: Poor Standing balance comment: max assist 2                            Cognition Arousal/Alertness: Lethargic Behavior During Therapy: Flat affect Overall Cognitive Status: Impaired/Different from baseline Area of Impairment: Attention;Following commands;Safety/judgement;Awareness;Problem solving                 Orientation  Level: Situation Current Attention Level: Selective Memory: Decreased recall of precautions;Decreased short-term memory Following Commands: Follows one step commands inconsistently;Follows one step commands with increased time Safety/Judgement: Decreased awareness of safety;Decreased awareness of deficits Awareness: Intellectual Problem Solving: Slow  processing;Decreased initiation;Difficulty sequencing;Requires verbal cues        Exercises      General Comments        Pertinent Vitals/Pain Pain Assessment: No/denies pain    Home Living                      Prior Function            PT Goals (current goals can now be found in the care plan section) Progress towards PT goals: Progressing toward goals    Frequency    Min 5X/week      PT Plan Current plan remains appropriate    Co-evaluation              AM-PAC PT "6 Clicks" Daily Activity  Outcome Measure  Difficulty turning over in bed (including adjusting bedclothes, sheets and blankets)?: A Little Difficulty moving from lying on back to sitting on the side of the bed? : A Lot Difficulty sitting down on and standing up from a chair with arms (e.g., wheelchair, bedside commode, etc,.)?: A Lot Help needed moving to and from a bed to chair (including a wheelchair)?: A Lot Help needed walking in hospital room?: Total Help needed climbing 3-5 steps with a railing? : Total 6 Click Score: 11    End of Session Equipment Utilized During Treatment: Gait belt Activity Tolerance: Other (comment) (L side neglect) Patient left: in chair;with call bell/phone within reach;with chair alarm set;with nursing/sitter in room Nurse Communication: Mobility status;Need for lift equipment PT Visit Diagnosis: Muscle weakness (generalized) (M62.81);Difficulty in walking, not elsewhere classified (R26.2);Other abnormalities of gait and mobility (R26.89)     Time: 0034-9179 PT Time Calculation (min) (ACUTE ONLY): 40 min  Charges:  $Therapeutic Activity: 23-37 mins $Neuromuscular Re-education: 8-22 mins                    G Codes:  Functional Assessment Tool Used: AM-PAC 6 Clicks Basic Mobility     Ramond Dial 05/14/2017, 1:01 PM   1:05 PM, 05/14/17 Mee Hives, PT, MS Physical Therapist - [place] [phone] (Office)

## 2017-05-14 NOTE — Clinical Social Work Note (Signed)
LCSW notified Lynnae Sandhoff and patient's brother of patient's discharge.  LCSW sent clinicals to facility via epic hub.   LCSW signing off.      Jervon Ream, Clydene Pugh, LCSW

## 2017-05-14 NOTE — Care Management Note (Signed)
Case Management Note  Patient Details  Name: [name] MRN: [id number] Date of Birth: 06-18-1932  Expected Discharge Date:  05/14/17               Expected Discharge Plan:  [place]  In-House Referral:  Clinical Social Work  Discharge planning Services  CM Consult  Post Acute Care Choice:  NA Choice offered to:  NA  Status of Service:  Completed, signed off  Additional Comments: Pt from [place]. discharging today returning to LTC. CSW to make arrangements for return to facility.   Sherald Barge, RN 05/14/2017, 11:42 AM

## 2017-05-14 NOTE — Care Management Important Message (Signed)
Important Message  Patient Details  Name: Verle Wheeling MRN: [id number] Date of Birth: 1932-09-24   Medicare Important Message Given:  Yes    Sherald Barge, RN 05/14/2017, 11:41 AM

## 2017-05-15 LAB — VITAMIN B1: Vitamin B1 (Thiamine): 129.6 nmol/L (ref 66.5–200.0)

## 2017-05-15 LAB — CULTURE, BLOOD (ROUTINE X 2): Special Requests: ADEQUATE

## 2017-05-17 LAB — CULTURE, BLOOD (ROUTINE X 2): Culture: NO GROWTH

## 2017-05-29 ENCOUNTER — Inpatient Hospital Stay (HOSPITAL_COMMUNITY)
Admission: EM | Admit: 2017-05-29 | Discharge: 2017-06-03 | DRG: 871 | Disposition: A | Discharge status: Hospice | Payer: Medicare Other | Attending: Internal Medicine | Admitting: Internal Medicine

## 2017-05-29 ENCOUNTER — Emergency Department (HOSPITAL_COMMUNITY): Payer: Medicare Other

## 2017-05-29 ENCOUNTER — Encounter (HOSPITAL_COMMUNITY): Payer: Self-pay | Admitting: *Deleted

## 2017-05-29 DIAGNOSIS — T68XXXA Hypothermia, initial encounter: Secondary | ICD-10-CM | POA: Diagnosis not present

## 2017-05-29 DIAGNOSIS — E870 Hyperosmolality and hypernatremia: Secondary | ICD-10-CM | POA: Diagnosis not present

## 2017-05-29 DIAGNOSIS — I639 Cerebral infarction, unspecified: Secondary | ICD-10-CM | POA: Diagnosis present

## 2017-05-29 DIAGNOSIS — H409 Unspecified glaucoma: Secondary | ICD-10-CM | POA: Diagnosis present

## 2017-05-29 DIAGNOSIS — J189 Pneumonia, unspecified organism: Secondary | ICD-10-CM | POA: Diagnosis present

## 2017-05-29 DIAGNOSIS — Y95 Nosocomial condition: Secondary | ICD-10-CM | POA: Diagnosis present

## 2017-05-29 DIAGNOSIS — Z8546 Personal history of malignant neoplasm of prostate: Secondary | ICD-10-CM

## 2017-05-29 DIAGNOSIS — Z79899 Other long term (current) drug therapy: Secondary | ICD-10-CM

## 2017-05-29 DIAGNOSIS — J690 Pneumonitis due to inhalation of food and vomit: Secondary | ICD-10-CM | POA: Diagnosis not present

## 2017-05-29 DIAGNOSIS — Z515 Encounter for palliative care: Secondary | ICD-10-CM | POA: Diagnosis present

## 2017-05-29 DIAGNOSIS — G9341 Metabolic encephalopathy: Secondary | ICD-10-CM | POA: Diagnosis present

## 2017-05-29 DIAGNOSIS — E860 Dehydration: Secondary | ICD-10-CM | POA: Diagnosis not present

## 2017-05-29 DIAGNOSIS — Z8249 Family history of ischemic heart disease and other diseases of the circulatory system: Secondary | ICD-10-CM

## 2017-05-29 DIAGNOSIS — F0390 Unspecified dementia without behavioral disturbance: Secondary | ICD-10-CM | POA: Diagnosis present

## 2017-05-29 DIAGNOSIS — A419 Sepsis, unspecified organism: Secondary | ICD-10-CM | POA: Diagnosis not present

## 2017-05-29 DIAGNOSIS — E46 Unspecified protein-calorie malnutrition: Secondary | ICD-10-CM | POA: Diagnosis present

## 2017-05-29 DIAGNOSIS — J9601 Acute respiratory failure with hypoxia: Secondary | ICD-10-CM | POA: Diagnosis present

## 2017-05-29 DIAGNOSIS — Z66 Do not resuscitate: Secondary | ICD-10-CM | POA: Diagnosis present

## 2017-05-29 DIAGNOSIS — I69391 Dysphagia following cerebral infarction: Secondary | ICD-10-CM

## 2017-05-29 DIAGNOSIS — Z681 Body mass index (BMI) 19 or less, adult: Secondary | ICD-10-CM

## 2017-05-29 LAB — BLOOD GAS, ARTERIAL
ACID-BASE EXCESS: 1.1 mmol/L (ref 0.0–2.0)
Bicarbonate: 25.1 mmol/L (ref 20.0–28.0)
Drawn by: 234301
O2 CONTENT: 1 L/min
O2 SAT: 92.2 %
PATIENT TEMPERATURE: 37
PCO2 ART: 43.1 mmHg (ref 32.0–48.0)
pH, Arterial: 7.39 (ref 7.350–7.450)
pO2, Arterial: 67.5 mmHg — ABNORMAL LOW (ref 83.0–108.0)

## 2017-05-29 LAB — URINALYSIS, ROUTINE W REFLEX MICROSCOPIC
Bilirubin Urine: NEGATIVE
Glucose, UA: NEGATIVE mg/dL
HGB URINE DIPSTICK: NEGATIVE
Ketones, ur: 5 mg/dL — AB
LEUKOCYTES UA: NEGATIVE
Nitrite: NEGATIVE
PROTEIN: 30 mg/dL — AB
SPECIFIC GRAVITY, URINE: 1.025 (ref 1.005–1.030)
SQUAMOUS EPITHELIAL / LPF: NONE SEEN
pH: 5 (ref 5.0–8.0)

## 2017-05-29 LAB — CBC WITH DIFFERENTIAL/PLATELET
BASOS ABS: 0 10*3/uL (ref 0.0–0.1)
BASOS PCT: 0 %
EOS PCT: 0 %
Eosinophils Absolute: 0 10*3/uL (ref 0.0–0.7)
HCT: 40.5 % (ref 39.0–52.0)
Hemoglobin: 13.5 g/dL (ref 13.0–17.0)
LYMPHS PCT: 2 %
Lymphs Abs: 0.2 10*3/uL — ABNORMAL LOW (ref 0.7–4.0)
MCH: 30.8 pg (ref 26.0–34.0)
MCHC: 33.3 g/dL (ref 30.0–36.0)
MCV: 92.5 fL (ref 78.0–100.0)
MONO ABS: 0.2 10*3/uL (ref 0.1–1.0)
Monocytes Relative: 3 %
NEUTROS ABS: 8.3 10*3/uL — AB (ref 1.7–7.7)
Neutrophils Relative %: 95 %
PLATELETS: 263 10*3/uL (ref 150–400)
RBC: 4.38 MIL/uL (ref 4.22–5.81)
RDW: 15.3 % (ref 11.5–15.5)
WBC: 8.7 10*3/uL (ref 4.0–10.5)

## 2017-05-29 LAB — COMPREHENSIVE METABOLIC PANEL
ALBUMIN: 3.3 g/dL — AB (ref 3.5–5.0)
ALT: 44 U/L (ref 17–63)
AST: 31 U/L (ref 15–41)
Alkaline Phosphatase: 95 U/L (ref 38–126)
Anion gap: 10 (ref 5–15)
BUN: 19 mg/dL (ref 6–20)
CHLORIDE: 105 mmol/L (ref 101–111)
CO2: 27 mmol/L (ref 22–32)
CREATININE: 1.06 mg/dL (ref 0.61–1.24)
Calcium: 9.9 mg/dL (ref 8.9–10.3)
GFR calc Af Amer: >60 mL/min (ref >60)
GLUCOSE: 87 mg/dL (ref 65–99)
POTASSIUM: 4.6 mmol/L (ref 3.5–5.1)
Sodium: 142 mmol/L (ref 135–145)
Total Bilirubin: 1.3 mg/dL — ABNORMAL HIGH (ref 0.3–1.2)
Total Protein: 7.4 g/dL (ref 6.5–8.1)

## 2017-05-29 LAB — I-STAT CG4 LACTIC ACID, ED: LACTIC ACID, VENOUS: 1.07 mmol/L (ref 0.5–1.9)

## 2017-05-29 MED ORDER — PIPERACILLIN-TAZOBACTAM 3.375 G IVPB
3.3750 g | Freq: Three times a day (TID) | INTRAVENOUS | Status: DC
  Administered 2017-05-30 (×2): 3.375 g via INTRAVENOUS
  Filled 2017-05-29 (×2): qty 50

## 2017-05-29 MED ORDER — ALBUTEROL SULFATE (2.5 MG/3ML) 0.083% IN NEBU: 2.5000 mg | INHALATION_SOLUTION | Freq: Four times a day (QID) | RESPIRATORY_TRACT | Status: DC

## 2017-05-29 MED ORDER — IPRATROPIUM-ALBUTEROL 0.5-2.5 (3) MG/3ML IN SOLN
3.0000 mL | Freq: Three times a day (TID) | RESPIRATORY_TRACT | Status: DC
  Administered 2017-05-30 – 2017-06-03 (×13): 3 mL via RESPIRATORY_TRACT
  Filled 2017-05-29 (×13): qty 3

## 2017-05-29 MED ORDER — ENOXAPARIN SODIUM 40 MG/0.4ML ~~LOC~~ SOLN
40.0000 mg | SUBCUTANEOUS | Status: DC
  Administered 2017-05-29 – 2017-06-02 (×5): 40 mg via SUBCUTANEOUS
  Filled 2017-05-29 (×5): qty 0.4

## 2017-05-29 MED ORDER — ONDANSETRON HCL 4 MG PO TABS: 4.0000 mg | ORAL_TABLET | Freq: Four times a day (QID) | ORAL | Status: DC | PRN

## 2017-05-29 MED ORDER — ASPIRIN 300 MG RE SUPP
300.0000 mg | Freq: Every day | RECTAL | Status: DC
  Administered 2017-05-30 – 2017-06-03 (×5): 300 mg via RECTAL
  Filled 2017-05-29 (×5): qty 1

## 2017-05-29 MED ORDER — ORAL CARE MOUTH RINSE
15.0000 mL | Freq: Two times a day (BID) | OROMUCOSAL | Status: DC
  Administered 2017-05-29 – 2017-06-03 (×8): 15 mL via OROMUCOSAL

## 2017-05-29 MED ORDER — IPRATROPIUM BROMIDE 0.02 % IN SOLN: 0.5000 mg | Freq: Four times a day (QID) | RESPIRATORY_TRACT | Status: DC

## 2017-05-29 MED ORDER — VANCOMYCIN HCL IN DEXTROSE 1-5 GM/200ML-% IV SOLN
1000.0000 mg | Freq: Once | INTRAVENOUS | Status: AC
  Administered 2017-05-29: 1000 mg via INTRAVENOUS
  Filled 2017-05-29: qty 200

## 2017-05-29 MED ORDER — PIPERACILLIN-TAZOBACTAM 3.375 G IVPB 30 MIN
3.3750 g | Freq: Once | INTRAVENOUS | Status: AC
  Administered 2017-05-29: 3.375 g via INTRAVENOUS
  Filled 2017-05-29: qty 50

## 2017-05-29 MED ORDER — PIPERACILLIN-TAZOBACTAM 3.375 G IVPB: 3.3750 g | Freq: Three times a day (TID) | INTRAVENOUS | Status: DC

## 2017-05-29 MED ORDER — SODIUM CHLORIDE 0.9 % IV SOLN
INTRAVENOUS | Status: DC
  Administered 2017-05-29 – 2017-06-02 (×6): via INTRAVENOUS

## 2017-05-29 MED ORDER — IPRATROPIUM-ALBUTEROL 0.5-2.5 (3) MG/3ML IN SOLN
3.0000 mL | Freq: Four times a day (QID) | RESPIRATORY_TRACT | Status: DC
  Administered 2017-05-29: 3 mL via RESPIRATORY_TRACT
  Filled 2017-05-29: qty 3

## 2017-05-29 MED ORDER — ONDANSETRON HCL 4 MG/2ML IJ SOLN: 4.0000 mg | Freq: Four times a day (QID) | INTRAMUSCULAR | Status: DC | PRN

## 2017-05-29 MED ORDER — LATANOPROST 0.005 % OP SOLN
1.0000 [drp] | Freq: Every day | OPHTHALMIC | Status: DC
  Administered 2017-05-29 – 2017-06-02 (×5): 1 [drp] via OPHTHALMIC
  Filled 2017-05-29 (×2): qty 2.5

## 2017-05-29 MED ORDER — VANCOMYCIN HCL 500 MG IV SOLR
500.0000 mg | Freq: Two times a day (BID) | INTRAVENOUS | Status: DC
  Administered 2017-05-30: 500 mg via INTRAVENOUS
  Filled 2017-05-29 (×3): qty 500

## 2017-05-29 NOTE — Progress Notes (Signed)
Pharmacy Antibiotic Note  [name] is a 83 y.o. male admitted on 05/29/2017 with pneumonia.  Pharmacy has been consulted for vancomycin and zosyn dosing.  Plan: Vancomycin 500 IV every 12 hours.  Goal trough 15-20 mcg/mL. Zosyn 3.375g IV q8h (4 hour infusion).  Height: 5\' 10"  (177.8 cm) Weight: 136 lb 14.4 oz (62.1 kg) IBW/kg (Calculated) : 73  Temp (24hrs), Avg:94.1 F (34.5 C), Min:92.2 F (33.4 C), Max:97.5 F (36.4 C)   Recent Labs Lab 05/29/17 1710 05/29/17 1729  WBC 8.7  --   CREATININE 1.06  --   LATICACIDVEN  --  1.07    Estimated Creatinine Clearance: 46.4 mL/min (by C-G formula based on SCr of 1.06 mg/dL).    No Known Allergies  Antimicrobials this admission: Vanc 1gm IV  x 1 at 1830 Zosyn 3.375gm IV x 1 at 1755  Dose adjustments this admission:   Microbiology results: Thank you for allowing pharmacy to be a part of this patient's care.  Jerrye Beavers, PharmD, BCPS Clinical Pharmacist  Pager: [phone]  05/29/2017 11:05 PM

## 2017-05-29 NOTE — ED Provider Notes (Signed)
[name]   CSN: [id number] Arrival date & time: 05/29/17  1650     History   Chief Complaint Chief Complaint  Patient presents with  . Dysphagia    HPI [name] is a 83 y.o. male.She complaint is hypoxemia, weakness, difficulty breathing  HPI: 83 year old male admitted here 7-3 through 7-9 with hypothermia, possible aspiration, recent stroke, dysphasia.  After rehabilitation team evaluation thickened/honey insistency feeds were recommended. He completed a course of antibiotics including Augmentin for possible aspiration pneumonia.  Staff at his facility today noticed he was having difficulty breathing. Found to have saturations of 84%. Staff described difficulty with his thickened feeds over the last 2 days as well he was transferred here. Past Medical History:  Diagnosis Date  . CVA (cerebral vascular accident) ([place]) 04/2017  . Glaucoma   . Memory loss   . Prostate cancer ([place]) 2004  . Tubular adenoma polyp of rectum 10/08/2013    Patient Active Problem List   Diagnosis Date Noted  . Acute metabolic encephalopathy 17/40/8144  . Encounter for hospice care discussion   . Palliative care encounter   . Goals of care, counseling/discussion   . Aspiration pneumonitis ([place])   . Hypothermia 05/06/2017  . Episode of unresponsiveness   . CVA (cerebral vascular accident) ([place]) 04/30/2017  . UTI (urinary tract infection) 04/28/2017  . Renal failure syndrome   . Syncope and collapse 04/27/2017  . Dehydration 04/27/2017  . Dementia 04/27/2017  . Rhabdomyolysis 04/27/2017  . Femur fracture, left ([place]) 07/03/2014  . Bradycardia 07/03/2014  . Abdominal pain, other specified site 10/14/2013  . Nausea & vomiting 10/14/2013    Past Surgical History:  Procedure Laterality Date  . FEMUR IM NAIL Left 07/05/2014   Procedure: INTRAMEDULLARY (IM) NAIL FEMORAL;  Surgeon: Marianna Payment, MD;  Location: [facility];  Service: Orthopedics;  Laterality: Left;  .  prostate seed implant  2004       Home Medications    Prior to Admission medications   Medication Sig Start Date End Date Taking? Authorizing Provider  aspirin 325 MG tablet Take 1 tablet (325 mg total) by mouth daily. 05/03/17  Yes Debbe Odea, MD  Cholecalciferol (VITAMIN D) 2000 units CAPS Take 1 capsule by mouth daily.   Yes [provider]  donepezil (ARICEPT) 10 MG tablet Take 1 tablet (10 mg total) by mouth at bedtime. 08/22/16  Yes Dennie Bible, NP  furosemide (LASIX) 20 MG tablet Take 20 mg by mouth daily as needed for fluid.  01/21/17  Yes [provider]  latanoprost (XALATAN) 0.005 % ophthalmic solution Place 1 drop into both eyes at bedtime.   Yes [provider]  Maltodextrin-Xanthan Gum ([place]) POWD Please use to thicken liquid to honey thickened consistency 05/11/17  Yes Tat, Shanon Brow, MD  memantine (NAMENDA) 10 MG tablet Take 1 tablet (10 mg total) by mouth 2 (two) times daily. 08/22/16  Yes Dennie Bible, NP  pantoprazole (PROTONIX) 40 MG tablet Take 1 tablet (40 mg total) by mouth daily. X 30 days 05/14/17  Yes Tat, Shanon Brow, MD  thiamine 100 MG tablet Take 1 tablet (100 mg total) by mouth daily. 05/14/17  Yes TatShanon Brow, MD    Family History Family History  Problem Relation Age of Onset  . Ovarian cancer Mother   . Heart disease Father   . Aneurysm Father     Social History Social History  Substance Use Topics  . Smoking status: Never Smoker  .  Smokeless tobacco: Never Used  . Alcohol use No     Comment: 1 drink a month     Allergies   Patient has no known allergies.   Review of Systems Review of Systems  Unable to perform ROS: Patient nonverbal  Constitutional: Negative for appetite change, chills, diaphoresis, fatigue and fever.  HENT: Negative for mouth sores, sore throat and trouble swallowing.   Eyes: Negative for visual disturbance.  Respiratory: Negative for cough, chest tightness,  shortness of breath and wheezing.   Cardiovascular: Negative for chest pain.  Gastrointestinal: Negative for abdominal distention, abdominal pain, diarrhea, nausea and vomiting.  Endocrine: Negative for polydipsia, polyphagia and polyuria.  Genitourinary: Negative for dysuria, frequency and hematuria.  Musculoskeletal: Negative for gait problem.  Skin: Negative for color change, pallor and rash.  Neurological: Negative for dizziness, syncope, light-headedness and headaches.  Hematological: Does not bruise/bleed easily.  Psychiatric/Behavioral: Negative for behavioral problems and confusion.     Physical Exam Updated Vital Signs BP (!) 77/56 (BP Location: Right Arm)   Pulse 85   Temp (!) 92.7 F (33.7 C) (Rectal)   Resp 18   Ht 5\' 10"  (1.778 m)   Wt 65.8 kg (145 lb)   SpO2 96%   BMI 20.81 kg/m   Physical Exam  Constitutional: No distress.  Elderly, thin, frail appearing. No frank distress  HENT:  Head: Normocephalic.  No conjunctival injection. Conjunctiva not pale. No scleral icterus  Eyes: Pupils are equal, round, and reactive to light. Conjunctivae are normal. No scleral icterus.  Neck: Normal range of motion. Neck supple. No thyromegaly present.  Cardiovascular: Normal rate and regular rhythm.  Exam reveals no gallop and no friction rub.   No murmur heard. Pulmonary/Chest: Effort normal and breath sounds normal. No respiratory distress. He has no wheezes. He has no rales.  Rhonchorous bilateral breath sounds.  Abdominal: Soft. Bowel sounds are normal. He exhibits no distension. There is no tenderness. There is no rebound.  Musculoskeletal: Normal range of motion.  Neurological:  Awake. Follow simple commands such as opening mouth. No verbal responses. Moves all 4 extremities  Skin: Skin is warm and dry. No rash noted.  Psychiatric: He has a normal mood and affect. His behavior is normal.     ED Treatments / Results  Labs (all labs ordered are listed, but only  abnormal results are displayed) Labs Reviewed  CBC WITH DIFFERENTIAL/PLATELET - Abnormal; Notable for the following:       Result Value   Neutro Abs 8.3 (*)    Lymphs Abs 0.2 (*)    All other components within normal limits  COMPREHENSIVE METABOLIC PANEL - Abnormal; Notable for the following:    Albumin 3.3 (*)    Total Bilirubin 1.3 (*)    All other components within normal limits  URINALYSIS, ROUTINE W REFLEX MICROSCOPIC - Abnormal; Notable for the following:    APPearance HAZY (*)    Ketones, ur 5 (*)    Protein, ur 30 (*)    Bacteria, UA RARE (*)    All other components within normal limits  BLOOD GAS, ARTERIAL - Abnormal; Notable for the following:    pO2, Arterial 67.5 (*)    All other components within normal limits  CULTURE, BLOOD (ROUTINE X 2)  CULTURE, BLOOD (ROUTINE X 2)  URINE CULTURE  I-STAT CG4 LACTIC ACID, ED    EKG  EKG Interpretation None       Radiology Dg Chest Port 1 View  Result Date: 05/29/2017 CLINICAL  DATA:  Difficulty swallowing for several days, initial encounter EXAM: PORTABLE CHEST 1 VIEW COMPARISON:  05/08/2017 FINDINGS: Cardiac shadow is within normal limits. The lungs are well aerated bilaterally. Mild increased basilar opacities are noted bilaterally which may be related to aspiration given the clinical history. No acute bony abnormality is seen. IMPRESSION: New bibasilar opacities which may be related to aspiration Electronically Signed   By: Inez Catalina M.D.   On: 05/29/2017 17:31    Procedures Procedures (including critical care time)  Medications Ordered in ED Medications  piperacillin-tazobactam (ZOSYN) IVPB 3.375 g (0 g Intravenous Stopped 05/29/17 1825)  vancomycin (VANCOCIN) IVPB 1000 mg/200 mL premix (0 mg Intravenous Stopped 05/29/17 1934)     Initial Impression / Assessment and Plan / ED Course  I have reviewed the triage vital signs and the nursing notes.  Pertinent labs & imaging results that were available during my care  of the patient were reviewed by me and considered in my medical decision making (see chart for details).   hypothermia, and hypoxemic. Similar presentation improved with treatment for aspiration and warming. Placed on bear hugger. Cultures obtained. Chest x-ray shows basilar infiltrate and possible aspiration. He is saturating well on 92%. He is not hypercarbic. Lactate normal. Given vancomycin, and Zosyn for possible aspiration, and recent hospitalization associated pneumonia.  Will discuss with hospitalist regarding admission  Final Clinical Impressions(s) / ED Diagnoses   Final diagnoses:  Hypothermia, initial encounter  Aspiration pneumonia of lower lobe, unspecified aspiration pneumonia type, unspecified laterality [facility])    New Prescriptions New Prescriptions   No medications on file     Tanna Furry, MD 05/29/17 2023

## 2017-05-29 NOTE — ED Triage Notes (Signed)
Pt comes in for difficulty swallowing over the last 2 days. Pt is from [facility]. They noticed he has been becoming more weak over the last day. Pt has hx of dementia.

## 2017-05-29 NOTE — Progress Notes (Signed)
Pharmacy Note:  Initial antibiotics for Vancomycin ordered by EDP for pneumonida.  MD also also ordered Zosyn x 1.  Estimated Creatinine Clearance: 49.1 mL/min (by C-G formula based on SCr of 1.06 mg/dL).   No Known Allergies  Vitals:   05/29/17 1654  BP: 125/86  Pulse: 79  Resp: 18  Temp: (!) 92.2 F (33.4 C)    Anti-infectives    Start     Dose/Rate Route Frequency Ordered Stop   05/29/17 1800  piperacillin-tazobactam (ZOSYN) IVPB 3.375 g     3.375 g 100 mL/hr over 30 Minutes Intravenous  Once 05/29/17 1753        Plan: Initial doses of Vancomycin X 1 ordered. F/U admission orders for further dosing if therapy continued.  Pricilla Larsson, [facility] 05/29/2017 6:09 PM

## 2017-05-29 NOTE — ED Notes (Signed)
Bair Hugger applied

## 2017-05-29 NOTE — ED Notes (Signed)
Pt as 85% on Room Air. Placed on 2L

## 2017-05-29 NOTE — H&P (Signed)
TRH H&P    Patient Demographics:    [name], is a 83 y.o. male  MRN: [id number]  DOB - 1932-08-30  Admit Date - 05/29/2017  Referring MD/NP/PA: Dr. Jeneen Rinks  Outpatient Primary MD for the patient is Dione Housekeeper, MD  Patient coming from: *Skilled nursing facility  Chief Complaint  Patient presents with  . Dysphagia      HPI:    [name]  is a 83 y.o. male, With history of prostate cancer, dementia, glaucoma, recent CVA, dysphagia, aspiration pneumonia who was recently discharged from hospital on 05/13/2017 after patient was treated for hypothermia likely from aspiration pneumonia. Patient was made DO NOT RESUSCITATE and was discharged skilled facility with palliative care follow-up. Speech therapy evaluation at that time recommended dysphagia 1 diet with honey thick liquids.  The patient was brought to hospital with hypoxemia, difficulty breathing. He was found to have oxygen saturation of 84%. Nursing staff had skilled facility describe difficulty with thickened feeds over the past 2 days. Patient is alert, but nonverbal. Not able to provide any significant history.  In the ED patient was found to be hypothermic with temperature 92.2, bair hugger was provided and temperature is slowly improving. Initial blood pressure was 77/56 which improved to 142/82. Patient started on vancomycin and Zosyn for healthcare associated pneumonia. Chest x-ray showed new bibasilar opacities, likely related to aspiration. Lactic acid 1.07. WBC 8.7  No history of nausea vomiting or diarrhea. No history of chest pain or shortness of breath     Review of systems:      As in history of present illness, other review of systems unobtainable as patient is currently nonverbal.   With Past History of the following :    Past Medical History:  Diagnosis Date  . CVA (cerebral vascular accident) ([place]) 04/2017  .  Glaucoma   . Memory loss   . Prostate cancer ([place]) 2004  . Tubular adenoma polyp of rectum 10/08/2013      Past Surgical History:  Procedure Laterality Date  . FEMUR IM NAIL Left 07/05/2014   Procedure: INTRAMEDULLARY (IM) NAIL FEMORAL;  Surgeon: Marianna Payment, MD;  Location: [facility];  Service: Orthopedics;  Laterality: Left;  . prostate seed implant  2004      Social History:      Social History  Substance Use Topics  . Smoking status: Never Smoker  . Smokeless tobacco: Never Used  . Alcohol use No     Comment: 1 drink a month       Family History :     Family History  Problem Relation Age of Onset  . Ovarian cancer Mother   . Heart disease Father   . Aneurysm Father       Home Medications:   Prior to Admission medications   Medication Sig Start Date End Date Taking? Authorizing Provider  aspirin 325 MG tablet Take 1 tablet (325 mg total) by mouth daily. 05/03/17  Yes Debbe Odea, MD  Cholecalciferol (VITAMIN D) 2000 units CAPS Take 1 capsule by mouth daily.  Yes [provider]  donepezil (ARICEPT) 10 MG tablet Take 1 tablet (10 mg total) by mouth at bedtime. 08/22/16  Yes Dennie Bible, NP  furosemide (LASIX) 20 MG tablet Take 20 mg by mouth daily as needed for fluid.  01/21/17  Yes [provider]  latanoprost (XALATAN) 0.005 % ophthalmic solution Place 1 drop into both eyes at bedtime.   Yes [provider]  Maltodextrin-Xanthan Gum ([place]) POWD Please use to thicken liquid to honey thickened consistency 05/11/17  Yes Tat, Shanon Brow, MD  memantine (NAMENDA) 10 MG tablet Take 1 tablet (10 mg total) by mouth 2 (two) times daily. 08/22/16  Yes Dennie Bible, NP  pantoprazole (PROTONIX) 40 MG tablet Take 1 tablet (40 mg total) by mouth daily. X 30 days 05/14/17  Yes Tat, Shanon Brow, MD  thiamine 100 MG tablet Take 1 tablet (100 mg total) by mouth daily. 05/14/17  Yes TatShanon Brow, MD     Allergies:    No Known  Allergies   Physical Exam:   Vitals  Blood pressure (!) 142/82, pulse 69, temperature (!) 92.7 F (33.7 C), temperature source Rectal, resp. rate 14, height 5\' 10"  (1.778 m), weight 65.8 kg (145 lb), SpO2 100 %.  1.  General: Alert, not  FOLLOWING commands  2. Psychiatric: Nonverbal  3. Neurologic: Alert, not following commands  4. Eyes :  anicteric sclerae, moist conjunctivae with no lid lag.   5. ENMT:  Mucous membranes moist  6. Neck:  supple, no cervical lymphadenopathy appriciated, No thyromegaly  7. Respiratory : Normal respiratory effort, good air movement bilaterally,clear to  auscultation bilaterally  8. Cardiovascular : RRR, no gallops, rubs or murmurs, no leg edema  9. Gastrointestinal:  Positive bowel sounds, abdomen soft, non-tender to palpation,no hepatosplenomegaly, no rigidity or guarding       10. Skin:  No cyanosis, normal texture and turgor, no rash, lesions or ulcers  11.Musculoskeletal:  Good muscle tone,  joints appear normal , no effusions,  normal range of motion    Data Review:    CBC  Recent Labs Lab 05/29/17 1710  WBC 8.7  HGB 13.5  HCT 40.5  PLT 263  MCV 92.5  MCH 30.8  MCHC 33.3  RDW 15.3  LYMPHSABS 0.2*  MONOABS 0.2  EOSABS 0.0  BASOSABS 0.0   ------------------------------------------------------------------------------------------------------------------  Chemistries   Recent Labs Lab 05/29/17 1710  NA 142  K 4.6  CL 105  CO2 27  GLUCOSE 87  BUN 19  CREATININE 1.06  CALCIUM 9.9  AST 31  ALT 44  ALKPHOS 95  BILITOT 1.3*   ------------------------------------------------------------------------------------------------------------------  ------------------------------------------------------------------------------------------------------------------ GFR: Estimated Creatinine Clearance: 49.1 mL/min (by C-G formula based on SCr of 1.06 mg/dL). Liver Function Tests:  Recent Labs Lab 05/29/17 1710    AST 31  ALT 44  ALKPHOS 95  BILITOT 1.3*  PROT 7.4  ALBUMIN 3.3*    --------------------------------------------------------------------------------------------------------------- Urine analysis:    Component Value Date/Time   COLORURINE YELLOW 05/29/2017 1925   APPEARANCEUR HAZY (A) 05/29/2017 1925   LABSPEC 1.025 05/29/2017 1925   PHURINE 5.0 05/29/2017 1925   GLUCOSEU NEGATIVE 05/29/2017 1925   HGBUR NEGATIVE 05/29/2017 1925   BILIRUBINUR NEGATIVE 05/29/2017 1925   KETONESUR 5 (A) 05/29/2017 1925   PROTEINUR 30 (A) 05/29/2017 1925   UROBILINOGEN 1.0 12/22/2007 1456   NITRITE NEGATIVE 05/29/2017 1925   LEUKOCYTESUR NEGATIVE 05/29/2017 1925      Imaging Results:    Dg Chest Port 1 View  Result  Date: 05/29/2017 CLINICAL DATA:  Difficulty swallowing for several days, initial encounter EXAM: PORTABLE CHEST 1 VIEW COMPARISON:  05/08/2017 FINDINGS: Cardiac shadow is within normal limits. The lungs are well aerated bilaterally. Mild increased basilar opacities are noted bilaterally which may be related to aspiration given the clinical history. No acute bony abnormality is seen. IMPRESSION: New bibasilar opacities which may be related to aspiration Electronically Signed   By: Inez Catalina M.D.   On: 05/29/2017 17:31    My personal review of EKG: Rhythm NSR   Assessment & Plan:    Active Problems:   Dementia   CVA (cerebral vascular accident) ([place])   HCAP (healthcare-associated pneumonia)   1. Healthcare associated pneumonia- chest x-ray shows bibasilar opacities likely from aspiration pneumonia, patient started on vancomycin and Zosyn. Follow blood culture results. 2. Dysphagia- patient has dysphagia from recent stroke, swallow evaluation and previous admission recommended dysphagia 1 diet with honey thick liquid. Repeat swallow evaluation in a.m. Keep nothing by mouth. 3. Dementia- stable, mobile disturbance. Hold Namenda, Aricept until swallow examination is  complete. 4. Recent CVA-stable, hold aspirin by mouth. We'll start aspirin suppository from tomorrow morning. 5. Goals of care discussion-patient is DO NOT RESUSCITATE, also confirmed with the family that they don't want artificial means of nutrition including PEG tube. Will continue antibiotics for next 48 hours, if no improvement consider hospice evaluation for end-of-life care. This was confirmed with patient's brother and sister in law at bedside.   DVT Prophylaxis-   Lovenox   AM Labs Ordered, also please review Full Orders  Family Communication: Admission, patients condition and plan of care including tests being ordered have been discussed with the patient'sBrother and sister-in-law at bedside  who indicate understanding and agree with the plan and Code Status.  Code Status:  DO NOT RESUSCITATE  Admission status -observation    Time spent in minutes :  60 minutes   Krysti Hickling S M.D on 05/29/2017 at 9:37 PM  Between 7am to 7pm - Pager - [phone]. After 7pm go to [email or web address] - password [facility]  [facility] - Office  [phone]

## 2017-05-30 DIAGNOSIS — J9601 Acute respiratory failure with hypoxia: Secondary | ICD-10-CM | POA: Diagnosis not present

## 2017-05-30 DIAGNOSIS — Z515 Encounter for palliative care: Secondary | ICD-10-CM

## 2017-05-30 DIAGNOSIS — J690 Pneumonitis due to inhalation of food and vomit: Secondary | ICD-10-CM | POA: Diagnosis present

## 2017-05-30 DIAGNOSIS — Z8249 Family history of ischemic heart disease and other diseases of the circulatory system: Secondary | ICD-10-CM | POA: Diagnosis not present

## 2017-05-30 DIAGNOSIS — F0390 Unspecified dementia without behavioral disturbance: Secondary | ICD-10-CM | POA: Diagnosis not present

## 2017-05-30 DIAGNOSIS — J189 Pneumonia, unspecified organism: Secondary | ICD-10-CM | POA: Diagnosis not present

## 2017-05-30 DIAGNOSIS — E46 Unspecified protein-calorie malnutrition: Secondary | ICD-10-CM | POA: Diagnosis present

## 2017-05-30 DIAGNOSIS — E860 Dehydration: Secondary | ICD-10-CM | POA: Diagnosis not present

## 2017-05-30 DIAGNOSIS — Z681 Body mass index (BMI) 19 or less, adult: Secondary | ICD-10-CM | POA: Diagnosis not present

## 2017-05-30 DIAGNOSIS — I6300 Cerebral infarction due to thrombosis of unspecified precerebral artery: Secondary | ICD-10-CM | POA: Diagnosis not present

## 2017-05-30 DIAGNOSIS — F0150 Vascular dementia without behavioral disturbance: Secondary | ICD-10-CM | POA: Diagnosis not present

## 2017-05-30 DIAGNOSIS — Z7189 Other specified counseling: Secondary | ICD-10-CM

## 2017-05-30 DIAGNOSIS — Z8546 Personal history of malignant neoplasm of prostate: Secondary | ICD-10-CM | POA: Diagnosis not present

## 2017-05-30 DIAGNOSIS — H409 Unspecified glaucoma: Secondary | ICD-10-CM | POA: Diagnosis present

## 2017-05-30 DIAGNOSIS — A419 Sepsis, unspecified organism: Secondary | ICD-10-CM | POA: Diagnosis present

## 2017-05-30 DIAGNOSIS — E870 Hyperosmolality and hypernatremia: Secondary | ICD-10-CM | POA: Diagnosis not present

## 2017-05-30 DIAGNOSIS — Y95 Nosocomial condition: Secondary | ICD-10-CM | POA: Diagnosis present

## 2017-05-30 DIAGNOSIS — Z79899 Other long term (current) drug therapy: Secondary | ICD-10-CM | POA: Diagnosis not present

## 2017-05-30 DIAGNOSIS — I69391 Dysphagia following cerebral infarction: Secondary | ICD-10-CM | POA: Diagnosis not present

## 2017-05-30 DIAGNOSIS — Z66 Do not resuscitate: Secondary | ICD-10-CM | POA: Diagnosis present

## 2017-05-30 DIAGNOSIS — T68XXXA Hypothermia, initial encounter: Secondary | ICD-10-CM | POA: Diagnosis not present

## 2017-05-30 DIAGNOSIS — G9341 Metabolic encephalopathy: Secondary | ICD-10-CM | POA: Diagnosis present

## 2017-05-30 LAB — MRSA PCR SCREENING: MRSA by PCR: NEGATIVE

## 2017-05-30 LAB — CBC
HCT: 38.4 % — ABNORMAL LOW (ref 39.0–52.0)
HEMOGLOBIN: 12.7 g/dL — AB (ref 13.0–17.0)
MCH: 30.6 pg (ref 26.0–34.0)
MCHC: 33.1 g/dL (ref 30.0–36.0)
MCV: 92.5 fL (ref 78.0–100.0)
PLATELETS: 234 10*3/uL (ref 150–400)
RBC: 4.15 MIL/uL — ABNORMAL LOW (ref 4.22–5.81)
RDW: 15.5 % (ref 11.5–15.5)
WBC: 9.7 10*3/uL (ref 4.0–10.5)

## 2017-05-30 LAB — COMPREHENSIVE METABOLIC PANEL
ALK PHOS: 80 U/L (ref 38–126)
ALT: 37 U/L (ref 17–63)
ANION GAP: 9 (ref 5–15)
AST: 26 U/L (ref 15–41)
Albumin: 2.9 g/dL — ABNORMAL LOW (ref 3.5–5.0)
BUN: 16 mg/dL (ref 6–20)
CALCIUM: 9.3 mg/dL (ref 8.9–10.3)
CO2: 26 mmol/L (ref 22–32)
Chloride: 107 mmol/L (ref 101–111)
Creatinine, Ser: 0.97 mg/dL (ref 0.61–1.24)
GFR calc non Af Amer: >60 mL/min (ref >60)
Glucose, Bld: 92 mg/dL (ref 65–99)
Potassium: 3.9 mmol/L (ref 3.5–5.1)
SODIUM: 142 mmol/L (ref 135–145)
TOTAL PROTEIN: 6.5 g/dL (ref 6.5–8.1)
Total Bilirubin: 1.3 mg/dL — ABNORMAL HIGH (ref 0.3–1.2)

## 2017-05-30 MED ORDER — SODIUM CHLORIDE 0.9 % IV SOLN
1.5000 g | Freq: Four times a day (QID) | INTRAVENOUS | Status: DC
  Administered 2017-05-30 – 2017-06-03 (×16): 1.5 g via INTRAVENOUS
  Filled 2017-05-30 (×21): qty 1.5

## 2017-05-30 MED ORDER — VANCOMYCIN HCL 500 MG IV SOLR
INTRAVENOUS | Status: AC
  Filled 2017-05-30: qty 500

## 2017-05-30 NOTE — Progress Notes (Signed)
Pharmacy Antibiotic Note  [name] is a 83 y.o. male admitted on 05/29/2017 with pneumonia.  Pharmacy has been consulted for Unasyn dosing.  Vancomycin and Zosyn stopped.  Plan: Unasyn 1.5gm IV q6hrs Monitor labs, progress, c/s  Height: 5\' 10"  (177.8 cm) Weight: 136 lb 14.4 oz (62.1 kg) IBW/kg (Calculated) : 73  Temp (24hrs), Avg:94.1 F (34.5 C), Min:92.2 F (33.4 C), Max:97.5 F (36.4 C)   Recent Labs Lab 05/29/17 1710 05/29/17 1729 05/30/17 0541  WBC 8.7  --  9.7  CREATININE 1.06  --  0.97  LATICACIDVEN  --  1.07  --     Estimated Creatinine Clearance: 50.7 mL/min (by C-G formula based on SCr of 0.97 mg/dL).    No Known Allergies  Antimicrobials this admission: Vanc 7/25 >7/26 Zosyn 7/25>7/26 Unasyn 7/26 >>  Dose adjustments this admission:  Recent Results (from the past 240 hour(s))  Culture, blood (Routine X 2) w Reflex to ID Panel     Status: None (Preliminary result)   Collection Time: 05/29/17  5:10 PM  Result Value Ref Range Status   Specimen Description RIGHT ANTECUBITAL  Final   Special Requests   Final    BOTTLES DRAWN AEROBIC AND ANAEROBIC Blood Culture adequate volume   Culture NO GROWTH < 24 HOURS  Final   Report Status PENDING  Incomplete  Culture, blood (Routine X 2) w Reflex to ID Panel     Status: None (Preliminary result)   Collection Time: 05/29/17  5:26 PM  Result Value Ref Range Status   Specimen Description BLOOD LEFT HAND  Final   Special Requests   Final    BOTTLES DRAWN AEROBIC AND ANAEROBIC Blood Culture adequate volume   Culture NO GROWTH < 24 HOURS  Final   Report Status PENDING  Incomplete  MRSA PCR Screening     Status: None   Collection Time: 05/29/17 11:26 PM  Result Value Ref Range Status   MRSA by PCR NEGATIVE NEGATIVE Final    Comment:        The GeneXpert MRSA Assay (FDA approved for NASAL specimens only), is one component of a comprehensive MRSA colonization surveillance program. It is not intended to diagnose  MRSA infection nor to guide or monitor treatment for MRSA infections.    Thank you for allowing pharmacy to be a part of this patient's care.  Hart Robinsons, PharmD Clinical Pharmacist Pager:  [phone] 05/30/2017   05/30/2017 12:17 PM

## 2017-05-30 NOTE — Progress Notes (Signed)
Daily Progress Note   Patient Name: [name]       Date: 05/30/2017 DOB: 1932-07-22  Age: 83 y.o. MRN#: [id number] Attending Physician: Isaac Bliss, Olam Idler* Primary Care Physician: Dione Housekeeper, MD Admit Date: 05/29/2017  Reason for Consultation/Follow-up: Establishing goals of care and Psychosocial/spiritual support  Subjective: [name] is resting quietly. I meet with [name] outside the room. We talk about [name]'s pneumonia, treating what we can, and seeing how he is able to improve. We talk about his nutritional status, his stroke. Call to [name]'s healthcare power of attorney/legal guardian, his brother [name]. [name] shares that his preferences 24 to 48 hours for outcomes. [name] shares that he feels like his brother has improved over the last day. No further questions at this time.  Length of Stay: 0  Current Medications: Scheduled Meds:  . aspirin  300 mg Rectal Daily  . enoxaparin (LOVENOX) injection  40 mg Subcutaneous Q24H  . ipratropium-albuterol  3 mL Nebulization TID  . latanoprost  1 drop Both Eyes QHS  . mouth rinse  15 mL Mouth Rinse BID    Continuous Infusions: . sodium chloride 100 mL/hr at 05/29/17 2330  . ampicillin-sulbactam (UNASYN) IV 1.5 g (05/30/17 1654)    PRN Meds: ondansetron **OR** ondansetron (ZOFRAN) IV  Physical Exam  Constitutional: No distress.  HENT:  Head: Atraumatic.  Cardiovascular: Normal rate.   Abdominal: Soft. He exhibits no distension.  Musculoskeletal: He exhibits no edema.  Skin: Skin is warm and dry.  Nursing note and vitals reviewed.           Vital Signs: BP (!) 134/52   Pulse 86   Temp 99.5 F (37.5 C) (Axillary) Comment (Src): turned off bair hugger  Resp 16   Ht 5\' 10"  (1.778 m)   Wt 62.1 kg  (136 lb 14.4 oz)   SpO2 95%   BMI 19.64 kg/m  SpO2: SpO2: 95 % O2 Device: O2 Device: Nasal Cannula O2 Flow Rate: O2 Flow Rate (L/min): 2 L/min  Intake/output summary:  Intake/Output Summary (Last 24 hours) at 05/30/17 1708 Last data filed at 05/30/17 1600  Gross per 24 hour  Intake             1950 ml  Output  0 ml  Net             1950 ml   LBM: Last BM Date: 05/29/17 Baseline Weight: Weight: 65.8 kg (145 lb) Most recent weight: Weight: 62.1 kg (136 lb 14.4 oz)       Palliative Assessment/Data:      Patient Active Problem List   Diagnosis Date Noted  . Aspiration pneumonia ([place]) 05/30/2017  . HCAP (healthcare-associated pneumonia) 05/29/2017  . Acute metabolic encephalopathy 00/17/4944  . Encounter for hospice care discussion   . Palliative care encounter   . Goals of care, counseling/discussion   . Aspiration pneumonitis ([place])   . Hypothermia 05/06/2017  . Episode of unresponsiveness   . CVA (cerebral vascular accident) ([place]) 04/30/2017  . UTI (urinary tract infection) 04/28/2017  . Renal failure syndrome   . Syncope and collapse 04/27/2017  . Dehydration 04/27/2017  . Dementia 04/27/2017  . Rhabdomyolysis 04/27/2017  . Femur fracture, left ([place]) 07/03/2014  . Bradycardia 07/03/2014  . Abdominal pain, other specified site 10/14/2013  . Nausea & vomiting 10/14/2013    Palliative Care Assessment & Plan   Patient Profile: 83 year old man admitted from skilled nursing facility on 7/25 due to hypoxemia and difficulty breathing. oxygen saturation of 84% on room air. He had a recent CVA with dysphagia (family elecets no PEG tube) recently hospitalized for aspiration pneumonia and hypothermia (3 weeks ago). He presents again with the same today.  Assessment: Aspiration pneumonia; recurrent aspiration pneumonia, currently being treated with IV antibiotics. Likely to continue to aspirate. Hypothermia; related to underlying infection versus sequela of  stroke. [name] has had a workup for infection including urinalysis, blood cultures, chest x-ray.  Possibly his hypothermia is related to stroke. recent stroke; [name] had been living in his own home with a 5 hour day caregiver until a recent stroke. He has had dysphasia and mobility problems since. He was transferred to [place] home for rehab, but found to have low body temperature, returned to [facility] hospital for evaluation.  Recommendations/Plan:  24 to 48 hours for outcomes.   at this point, continue to treat the treatable but no extraordinary measures such as CPR or intubation. Return to [facility] upon discharge if improved versus hospice if unable to improve.  Goals of Care and Additional Recommendations:  Limitations on Scope of Treatment: Treat the treatable but no extraordinary measures.  Code Status:    Code Status Orders        Start     Ordered   05/29/17 2229  Do not attempt resuscitation (DNR)  Continuous    Question Answer Comment  In the event of cardiac or respiratory ARREST Do not call a "code blue"   In the event of cardiac or respiratory ARREST Do not perform Intubation, CPR, defibrillation or ACLS   In the event of cardiac or respiratory ARREST Use medication by any route, position, wound care, and other measures to relive pain and suffering. May use oxygen, suction and manual treatment of airway obstruction as needed for comfort.      05/29/17 2228    Code Status History    Date Active Date Inactive Code Status Order ID Comments User Context   05/06/2017 10:57 PM 05/14/2017  4:58 PM DNR [id number]  Karmen Bongo, MD Inpatient   05/06/2017  6:20 PM 05/06/2017 10:57 PM DNR [id number]  Fredia Sorrow, MD ED   04/27/2017 11:18 PM 05/02/2017  8:31 PM DNR [id number]  Orvan Falconer, MD Inpatient  07/05/2014  8:46 PM 07/07/2014  8:14 PM Full Code [id number]  Marianna Payment, MD Inpatient   07/03/2014 11:41 PM 07/05/2014  8:46 PM Full Code [id number]   Leone Haven, MD Inpatient   07/03/2014 11:07 PM 07/03/2014 11:41 PM Full Code [id number]  Janora Norlander, DO Inpatient    Advance Directive Documentation     Most Recent Value  Type of Advance Directive  Healthcare Power of Attorney  Pre-existing out of facility DNR order (yellow form or pink MOST form)  -  "MOST" Form in Place?  -       Prognosis:   < 3 months or less would not be surprising based on recent stroke, decreased functional status, possible evolution or extension of stroke, poor PO intake, unable to maintain core body temperature.  Discharge Planning:  likely return to [facility] for rehab  Care plan was discussed with nursing staff, case manager, social worker, and Dr. Jerilee Hoh.   Thank you for allowing the Palliative Medicine Team to assist in the care of this patient.   Time In: 1640 Time Out: 1710 Total Time 30 minutes Prolonged Time Billed  no       Greater than 50%  of this time was spent counseling and coordinating care related to the above assessment and plan.  Drue Novel, NP  Please contact Palliative Medicine Team phone at [phone] for questions and concerns.

## 2017-05-30 NOTE — Progress Notes (Signed)
PROGRESS NOTE    [name]  KKX:[id number] DOB: Mar 16, 1932 DOA: 05/29/2017 PCP: Dione Housekeeper, MD     Brief Narrative:  83 year old man admitted from skilled nursing facility on 7/25 due to hypoxemia and difficulty breathing. This out to have an oxygen saturation of 84% on room air. He had a recent CVA with dysphagia and was recently hospitalized for aspiration pneumonia. He presents again with the same today.   Assessment & Plan:   Active Problems:   Dementia   CVA (cerebral vascular accident) ([place])   HCAP (healthcare-associated pneumonia)   Aspiration pneumonia ([place])   Aspiration pneumonia -MRSA PCR negative, will discontinue vancomycin. -Start Unasyn for better coverage of aspiration pathogens. -Pending repeat swallow evaluation. -As this is his second admission for aspiration pneumonia in 3 weeks will request a palliative care consultation to further clarify goals of care.  Dementia -At baseline, currently no behavioral disturbances, restart Aricept and Namenda once swallow evaluation is complete.  Recent CVA -Stable, with significant dysphagia.   DVT prophylaxis: Lovenox Code Status: Full code Family Communication: Patient only Disposition Plan: SNF versus hospice home  Consultants:   Palliative care  Procedures:   None  Antimicrobials:  Anti-infectives    Start     Dose/Rate Route Frequency Ordered Stop   05/30/17 1200  ampicillin-sulbactam (UNASYN) 1.5 g in sodium chloride 0.9 % 50 mL IVPB     1.5 g 100 mL/hr over 30 Minutes Intravenous Every 6 hours 05/30/17 1146     05/30/17 0600  vancomycin (VANCOCIN) 500 mg in sodium chloride 0.9 % 100 mL IVPB  Status:  Discontinued     500 mg 100 mL/hr over 60 Minutes Intravenous Every 12 hours 05/29/17 2310 05/30/17 1111   05/30/17 0200  piperacillin-tazobactam (ZOSYN) IVPB 3.375 g  Status:  Discontinued     3.375 g 12.5 mL/hr over 240 Minutes Intravenous Every 8 hours 05/29/17 2310 05/30/17 1111   05/29/17  1815  vancomycin (VANCOCIN) IVPB 1000 mg/200 mL premix     1,000 mg 200 mL/hr over 60 Minutes Intravenous  Once 05/29/17 1810 05/29/17 1934   05/29/17 1800  piperacillin-tazobactam (ZOSYN) IVPB 3.375 g     3.375 g 100 mL/hr over 30 Minutes Intravenous  Once 05/29/17 1753 05/29/17 1825   05/29/17 0200  piperacillin-tazobactam (ZOSYN) IVPB 3.375 g  Status:  Discontinued     3.375 g 12.5 mL/hr over 240 Minutes Intravenous Every 8 hours 05/29/17 2310 05/29/17 2311       Subjective: Lying in bed, eyes open, nonconversant  Objective: Vitals:   05/29/17 2351 05/30/17 0556 05/30/17 0807 05/30/17 1436  BP:  (!) 154/82    Pulse:  69    Resp:  13    Temp:      TempSrc:      SpO2: 93% 100% 97% 95%  Weight:      Height:        Intake/Output Summary (Last 24 hours) at 05/30/17 1512 Last data filed at 05/30/17 0300  Gross per 24 hour  Intake              600 ml  Output                0 ml  Net              600 ml   Filed Weights   05/29/17 1653 05/29/17 2254  Weight: 65.8 kg (145 lb) 62.1 kg (136 lb 14.4 oz)    Examination:  General exam: Awake, unable  to assess orientation as he is currently nonverbal Respiratory system: Coarse bilateral breath sounds, no wheezing Cardiovascular system:RRR. No murmurs, rubs, gallops. Gastrointestinal system: Abdomen is nondistended, soft and nontender. No organomegaly or masses felt. Normal bowel sounds heard. Central nervous system: Unable to fully assess given current mental state Extremities: No C/C/E, +pedal pulses Skin: No rashes, lesions or ulcers Psychiatry: Unable to fully assess given current mental state    Data Reviewed: I have personally reviewed following labs and imaging studies  CBC:  Recent Labs Lab 05/29/17 1710 05/30/17 0541  WBC 8.7 9.7  NEUTROABS 8.3*  --   HGB 13.5 12.7*  HCT 40.5 38.4*  MCV 92.5 92.5  PLT 263 132   Basic Metabolic Panel:  Recent Labs Lab 05/29/17 1710 05/30/17 0541  NA 142 142  K 4.6  3.9  CL 105 107  CO2 27 26  GLUCOSE 87 92  BUN 19 16  CREATININE 1.06 0.97  CALCIUM 9.9 9.3   GFR: Estimated Creatinine Clearance: 50.7 mL/min (by C-G formula based on SCr of 0.97 mg/dL). Liver Function Tests:  Recent Labs Lab 05/29/17 1710 05/30/17 0541  AST 31 26  ALT 44 37  ALKPHOS 95 80  BILITOT 1.3* 1.3*  PROT 7.4 6.5  ALBUMIN 3.3* 2.9*   No results for input(s): LIPASE, AMYLASE in the last 168 hours. No results for input(s): AMMONIA in the last 168 hours. Coagulation Profile: No results for input(s): INR, PROTIME in the last 168 hours. Cardiac Enzymes: No results for input(s): CKTOTAL, CKMB, CKMBINDEX, TROPONINI in the last 168 hours. BNP (last 3 results) No results for input(s): PROBNP in the last 8760 hours. HbA1C: No results for input(s): HGBA1C in the last 72 hours. CBG: No results for input(s): GLUCAP in the last 168 hours. Lipid Profile: No results for input(s): CHOL, HDL, LDLCALC, TRIG, CHOLHDL, LDLDIRECT in the last 72 hours. Thyroid Function Tests: No results for input(s): TSH, T4TOTAL, FREET4, T3FREE, THYROIDAB in the last 72 hours. Anemia Panel: No results for input(s): VITAMINB12, FOLATE, FERRITIN, TIBC, IRON, RETICCTPCT in the last 72 hours. Urine analysis:    Component Value Date/Time   COLORURINE YELLOW 05/29/2017 1925   APPEARANCEUR HAZY (A) 05/29/2017 1925   LABSPEC 1.025 05/29/2017 1925   PHURINE 5.0 05/29/2017 1925   GLUCOSEU NEGATIVE 05/29/2017 1925   HGBUR NEGATIVE 05/29/2017 1925   BILIRUBINUR NEGATIVE 05/29/2017 1925   KETONESUR 5 (A) 05/29/2017 1925   PROTEINUR 30 (A) 05/29/2017 1925   UROBILINOGEN 1.0 12/22/2007 1456   NITRITE NEGATIVE 05/29/2017 1925   LEUKOCYTESUR NEGATIVE 05/29/2017 1925   Sepsis Labs: @LABRCNTIP (procalcitonin:4,lacticidven:4)  ) Recent Results (from the past 240 hour(s))  Culture, blood (Routine X 2) w Reflex to ID Panel     Status: None (Preliminary result)   Collection Time: 05/29/17  5:10 PM  Result  Value Ref Range Status   Specimen Description RIGHT ANTECUBITAL  Final   Special Requests   Final    BOTTLES DRAWN AEROBIC AND ANAEROBIC Blood Culture adequate volume   Culture NO GROWTH < 24 HOURS  Final   Report Status PENDING  Incomplete  Culture, blood (Routine X 2) w Reflex to ID Panel     Status: None (Preliminary result)   Collection Time: 05/29/17  5:26 PM  Result Value Ref Range Status   Specimen Description BLOOD LEFT HAND  Final   Special Requests   Final    BOTTLES DRAWN AEROBIC AND ANAEROBIC Blood Culture adequate volume   Culture NO GROWTH < 24 HOURS  Final   Report Status PENDING  Incomplete  MRSA PCR Screening     Status: None   Collection Time: 05/29/17 11:26 PM  Result Value Ref Range Status   MRSA by PCR NEGATIVE NEGATIVE Final    Comment:        The GeneXpert MRSA Assay (FDA approved for NASAL specimens only), is one component of a comprehensive MRSA colonization surveillance program. It is not intended to diagnose MRSA infection nor to guide or monitor treatment for MRSA infections.          Radiology Studies: Dg Chest Port 1 View  Result Date: 05/29/2017 CLINICAL DATA:  Difficulty swallowing for several days, initial encounter EXAM: PORTABLE CHEST 1 VIEW COMPARISON:  05/08/2017 FINDINGS: Cardiac shadow is within normal limits. The lungs are well aerated bilaterally. Mild increased basilar opacities are noted bilaterally which may be related to aspiration given the clinical history. No acute bony abnormality is seen. IMPRESSION: New bibasilar opacities which may be related to aspiration Electronically Signed   By: Inez Catalina M.D.   On: 05/29/2017 17:31        Scheduled Meds: . aspirin  300 mg Rectal Daily  . enoxaparin (LOVENOX) injection  40 mg Subcutaneous Q24H  . ipratropium-albuterol  3 mL Nebulization TID  . latanoprost  1 drop Both Eyes QHS  . mouth rinse  15 mL Mouth Rinse BID   Continuous Infusions: . sodium chloride 100 mL/hr at  05/29/17 2330  . ampicillin-sulbactam (UNASYN) IV       LOS: 0 days    Time spent: 35 minutes. Greater than 50% of this time was spent in direct contact with the patient coordinating care.     Lelon Frohlich, MD [facility] Pager [phone]  If 7PM-7AM, please contact night-coverage [email or web address] Password [email or web address] 05/30/2017, 3:12 PM

## 2017-05-30 NOTE — Care Management Obs Status (Signed)
[place] NOTIFICATION   Patient Details  Name: [name] MRN: [id number] Date of Birth: 10/14/1932   Medicare Observation Status Notification Given:  Yes    Ellenie Salome, Chauncey Reading, RN 05/30/2017, 1:04 PM

## 2017-05-30 NOTE — Evaluation (Signed)
Clinical/Bedside Swallow Evaluation Patient Details  Name: [name] MRN: [id number] Date of Birth: Aug 18, 1932  Today's Date: 05/30/2017 Time: SLP Start Time (ACUTE ONLY): 3 SLP Stop Time (ACUTE ONLY): 1147 SLP Time Calculation (min) (ACUTE ONLY): 17 min  Past Medical History:  Past Medical History:  Diagnosis Date  . CVA (cerebral vascular accident) ([place]) 04/2017  . Glaucoma   . Memory loss   . Prostate cancer ([place]) 2004  . Tubular adenoma polyp of rectum 10/08/2013   Past Surgical History:  Past Surgical History:  Procedure Laterality Date  . FEMUR IM NAIL Left 07/05/2014   Procedure: INTRAMEDULLARY (IM) NAIL FEMORAL;  Surgeon: Marianna Payment, MD;  Location: [facility];  Service: Orthopedics;  Laterality: Left;  . prostate seed implant  2004   HPI:  83 y.o. male, With history of prostate cancer, dementia, glaucoma, recent CVA, dysphagia, aspiration pneumonia who was recently discharged from hospital on 05/13/2017 after patient was treated for hypothermia likely from aspiration pneumonia. Patient was made DO NOT RESUSCITATE and was discharged skilled facility with palliative care follow-up. Speech therapy evaluation at that time recommended dysphagia 1 diet with honey thick liquids; previous MBS completed on 05/10/17 indicating need for NPO vs. Pudding-thick liquid diet; CXR 05/29/17 revealed new bibasilar opacities which may be related to aspiration.   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 paired with decreased overall expressive/receptive language function/impaired cognition at baseline; after limited oral care, inability to produce a cough and/or swallow reflex volitionally, pt presented with a weak throat clearing response and eventually refused any PO intake; per previous hospitalization and extensive dysphagia hx, pt is at a  severe risk for aspiration at this time d/t MBS results on 05/10/17 indicating NPO vs pudding-thick and Dysphagia 1 diet with palliative following at SNF and this admission with PNA despite modified diet at last discharge; ST will f/u for PO readiness for pudding-thick/Dysphagia 1 is appropriate and/or non-oral nutrition is the best route to decrease risk for aspiration at this time. SLP Visit Diagnosis: Dysphagia, unspecified (R13.10)    Aspiration Risk  Severe aspiration risk;Risk for inadequate nutrition/hydration    Diet Recommendation   NPO  Medication Administration: Via alternative means    Other  Recommendations Oral Care Recommendations: Oral care QID   Follow up Recommendations Skilled Nursing facility      Frequency and Duration min 2x/week  1 week       Prognosis Prognosis for Safe Diet Advancement: Guarded Barriers to Reach Goals: Cognitive deficits;Severity of deficits      Swallow Study   General Date of Onset: 05/29/17 HPI: 83 y.o. male, With history of prostate cancer, dementia, glaucoma, recent CVA, dysphagia, aspiration pneumonia who was recently discharged from hospital on 05/13/2017 after patient was treated for hypothermia likely from aspiration pneumonia. Patient was made DO NOT RESUSCITATE and was discharged skilled facility with palliative care follow-up. Speech therapy evaluation at that time recommended dysphagia 1 diet with honey thick liquids. Type of Study: Bedside Swallow Evaluation Previous Swallow Assessment: MBS 05/10/17 recommended pudding-thick/puree vs NPO Diet Prior to this Study: NPO Temperature Spikes Noted: No Respiratory Status: Nasal cannula History of Recent Intubation: No Behavior/Cognition: Alert;Cooperative;Requires cueing Oral Cavity Assessment: Other (comment) (DTA d/t limited oral movement/hyperactive gag reflex) Oral Care Completed by SLP: Yes Oral Cavity - Dentition: Poor condition;Missing dentition Self-Feeding Abilities: Needs  assist;Needs set up  Patient Positioning: Upright in bed Baseline Vocal Quality: Breathy;Low vocal intensity Volitional Cough: Cognitively unable to elicit Volitional Swallow: Unable to elicit    Oral/Motor/Sensory Function Overall Oral Motor/Sensory Function: Other (comment) (DTA; ? overall receptive/expressive language skills; limited)   Ice Chips Ice chips: Not tested   Thin Liquid Thin Liquid: Not tested    Nectar Thick Nectar Thick Liquid: Not tested   Honey Thick Honey Thick Liquid: Not tested   Puree Puree: Not tested   Solid      Solid: Not tested    Functional Assessment Tool Used: NOMS; clinical judgment Functional Limitations: Swallowing Swallow Current Status ([id number]): At least 80 percent but less than 100 percent impaired, limited or restricted Swallow Goal Status [phone]): At least 60 percent but less than 80 percent impaired, limited or restricted   Elvina Sidle, M.S., CCC-SLP 05/30/2017,12:13 PM

## 2017-05-31 LAB — BASIC METABOLIC PANEL
Anion gap: 8 (ref 5–15)
BUN: 16 mg/dL (ref 6–20)
CALCIUM: 9 mg/dL (ref 8.9–10.3)
CHLORIDE: 111 mmol/L (ref 101–111)
CO2: 26 mmol/L (ref 22–32)
CREATININE: 1.17 mg/dL (ref 0.61–1.24)
GFR calc Af Amer: >60 mL/min (ref >60)
GFR calc non Af Amer: 56 mL/min — ABNORMAL LOW (ref >60)
Glucose, Bld: 76 mg/dL (ref 65–99)
Potassium: 3.7 mmol/L (ref 3.5–5.1)
Sodium: 145 mmol/L (ref 135–145)

## 2017-05-31 LAB — URINE CULTURE: CULTURE: NO GROWTH

## 2017-05-31 LAB — CBC
HCT: 35.3 % — ABNORMAL LOW (ref 39.0–52.0)
Hemoglobin: 12.1 g/dL — ABNORMAL LOW (ref 13.0–17.0)
MCH: 31.3 pg (ref 26.0–34.0)
MCHC: 34.3 g/dL (ref 30.0–36.0)
MCV: 91.5 fL (ref 78.0–100.0)
PLATELETS: 213 10*3/uL (ref 150–400)
RBC: 3.86 MIL/uL — ABNORMAL LOW (ref 4.22–5.81)
RDW: 15.6 % — AB (ref 11.5–15.5)
WBC: 11.3 10*3/uL — ABNORMAL HIGH (ref 4.0–10.5)

## 2017-05-31 MED ORDER — METHYLPREDNISOLONE SODIUM SUCC 40 MG IJ SOLR
40.0000 mg | Freq: Two times a day (BID) | INTRAMUSCULAR | Status: DC
  Administered 2017-05-31 – 2017-06-02 (×5): 40 mg via INTRAVENOUS
  Filled 2017-05-31 (×5): qty 1

## 2017-05-31 NOTE — Clinical Social Work Note (Signed)
Patient admitted from [facility]. Patient was recently admitted from and there has been no change from clinical assessment completed on 05/08/17 remains accurate.    LCSW will continue to follow for discharge needs.      Volney Reierson, Clydene Pugh, LCSW

## 2017-05-31 NOTE — Progress Notes (Signed)
  Speech Language Pathology Treatment: Dysphagia  Patient Details Name: [name] MRN: [id number] DOB: 17-Jan-1932 Today's Date: 05/31/2017 Time: 5631-4970 SLP Time Calculation (min) (ACUTE ONLY): 25 min  Assessment / Plan / Recommendation Clinical Impression  Dysphagia treatment provided for PO trials. This SLP is familiar with this pt from previous hospital admission at beginning of July. Unfortunately pt's swallow function is appearing worse at bedside on this admission as pt is not managing secretions; upon entering room pt had saliva pooling out of mouth onto pillow and oral cavity was also full of saliva. 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 Provided oral care with suction. Attempted ice chip and puree trials; pt unable to propel bolus posteriorly to initiate a pharyngeal swallow and ultimately required suctioning for all attempts. Pt's vocal quality wet throughout session and was unable to initiate a cough or throat clear. Aspiration risk remains high. Recommend that pt remain NPO with meds via alternative means with frequent oral care. Will continue to follow for PO trials. If comfort feeds are considered, it would be best to provide after oral care and keep suction readily available in case pt unable to transit bolus.    HPI HPI: 83 y.o. male, With history of prostate cancer, dementia, glaucoma, recent CVA, dysphagia, aspiration pneumonia who was recently discharged from hospital on 05/13/2017 after patient was treated for hypothermia likely from aspiration pneumonia. Patient was made DO NOT RESUSCITATE and was discharged skilled facility with palliative care follow-up. Speech therapy evaluation at that time recommended dysphagia 1 diet with honey thick liquids.      SLP Plan  Continue with current plan of care       Recommendations  Diet  recommendations: NPO Medication Administration: Via alternative means                Oral Care Recommendations: Oral care QID Follow up Recommendations: Skilled Nursing facility SLP Visit Diagnosis: Dysphagia, unspecified (R13.10) Plan: Continue with current plan of care       [place], [place], [place] 05/31/2017, 10:51 AM [id number]

## 2017-05-31 NOTE — Care Management Important Message (Signed)
Important Message  Patient Details  Name: [name] MRN: [id number] Date of Birth: [birth year]   Medicare Important Message Given:  Yes    Giovanie Lefebre, Chauncey Reading, RN 05/31/2017, 2:54 PM

## 2017-05-31 NOTE — Progress Notes (Signed)
[facility] Progress Note  Patient: [name] JJK:[id number]   PCP: Dione Housekeeper, MD DOB: December 17, 1931   DOA: 05/29/2017   DOS: 05/31/2017   Date of Service: the patient was seen and examined on 05/31/2017  Subjective: Patient has dysarthria therefore ROS is difficult. Continues to deny any acute complaint but repeats only one sentence again and again. No acute events overnight. Still on bare hugger.   Brief hospital course: Pt. with PMH of CVA, dementia, dysphagia, prostate cancer; admitted on 05/29/2017, presented with complaint of cough and shortness of breath, was found to have recurrent aspiration pneumonia. Currently further plan is current treatment and discussion of costal care.  Assessment and Plan: 1. Recurrent aspiration pneumonia. Acute hypoxic respiratory failure. Sepsis with hypothermia. Initially started on broad-spectrum antibiotics. Currently on vancomycin. Blood cultures negative to date. Speech therapy consulted, continues to recommend remaining nothing by mouth. Mentation still not clear as well. Still requiring oxygen and has hypothermia. Adding IV Solu-Medrol to see if that will improve patient's respiratory status.  2. Goals of care discussion. Dysphagia. Recent CVA. Discussed at bedside with patient's brother Gwyndolyn Saxon. Palliative care continues to follow-up with the patient. Informed family that the patient remains at high risk for aspiration after his recent CVA and with his dementia as well as mentation change even though if it gets better with pneumonia will remain high risk for recurrent aspiration event down the road. Currently family wants to treat what is treatable, but informed family that aspiration due to dysarthria and acute CVA in the present of dementia would likely be a terminal event. Patient still remains on Bair hugger as well as in respiratory distress with significant hypoxia. Awaiting further discussion with palliative care as well as  family.  3. Recent CVA. On aspirin suppository since the patient remains nothing by mouth Monitor.  4. Protein calorie malnutrition. In the setting of acute illness. Guarded. We'll monitor. Not candidate for PEG tube due to aspiration and dysphagia as well as dementia.  Diet: Remains nothing by mouth DVT Prophylaxis: subcutaneous Heparin  Advance goals of care discussion: DNR DNI  Family Communication: family was present at bedside, at the time of interview. The pt provided permission to discuss medical plan with the family. Opportunity was given to ask question and all questions were answered satisfactorily.   Disposition:  Discharge to be determined.  Consultants: palliaive care Procedures: none  Antibiotics: Anti-infectives    Start     Dose/Rate Route Frequency Ordered Stop   05/30/17 1200  ampicillin-sulbactam (UNASYN) 1.5 g in sodium chloride 0.9 % 50 mL IVPB     1.5 g 100 mL/hr over 30 Minutes Intravenous Every 6 hours 05/30/17 1146     05/30/17 0600  vancomycin (VANCOCIN) 500 mg in sodium chloride 0.9 % 100 mL IVPB  Status:  Discontinued     500 mg 100 mL/hr over 60 Minutes Intravenous Every 12 hours 05/29/17 2310 05/30/17 1111   05/30/17 0200  piperacillin-tazobactam (ZOSYN) IVPB 3.375 g  Status:  Discontinued     3.375 g 12.5 mL/hr over 240 Minutes Intravenous Every 8 hours 05/29/17 2310 05/30/17 1111   05/29/17 1815  vancomycin (VANCOCIN) IVPB 1000 mg/200 mL premix     1,000 mg 200 mL/hr over 60 Minutes Intravenous  Once 05/29/17 1810 05/29/17 1934   05/29/17 1800  piperacillin-tazobactam (ZOSYN) IVPB 3.375 g     3.375 g 100 mL/hr over 30 Minutes Intravenous  Once 05/29/17 1753 05/29/17 1825   05/29/17 0200  piperacillin-tazobactam (ZOSYN) IVPB  3.375 g  Status:  Discontinued     3.375 g 12.5 mL/hr over 240 Minutes Intravenous Every 8 hours 05/29/17 2310 05/29/17 2311       Objective: Physical Exam: Vitals:   05/30/17 1959 05/30/17 2257 05/31/17 0533  05/31/17 0748  BP:  135/81 139/78   Pulse:  93 83   Resp:  13 12   Temp:  97.7 F (36.5 C) 97.9 F (36.6 C)   TempSrc:  Oral Oral   SpO2: 96% 97% 99% 90%  Weight:      Height:        Intake/Output Summary (Last 24 hours) at 05/31/17 1230 Last data filed at 05/31/17 0900  Gross per 24 hour  Intake             2675 ml  Output              750 ml  Net             1925 ml   Filed Weights   05/29/17 1653 05/29/17 2254  Weight: 65.8 kg (145 lb) 62.1 kg (136 lb 14.4 oz)   General: Alert, Awake, follows commands. Appear in moderate distress, affect difficult to assess  Eyes: PERRL, Conjunctiva normal ENT: Oral Mucosa clear moist. Neck: difficult to assess JVD, no Abnormal Mass Or lumps Cardiovascular: S1 and S2 Present, no Murmur, Peripheral Pulses Present Respiratory: increased respiratory effort, Bilateral Air entry equal and Decreased, positive use of accessory muscle, bilateral Crackles, no wheezes Abdomen: Bowel Sound present, Soft and no tenderness, no hernia Skin: no redness, no Rash, no induration Extremities: no Pedal edema, no calf tenderness  Data Reviewed: CBC:  Recent Labs Lab 05/29/17 1710 05/30/17 0541 05/31/17 0606  WBC 8.7 9.7 11.3*  NEUTROABS 8.3*  --   --   HGB 13.5 12.7* 12.1*  HCT 40.5 38.4* 35.3*  MCV 92.5 92.5 91.5  PLT 263 234 725   Basic Metabolic Panel:  Recent Labs Lab 05/29/17 1710 05/30/17 0541 05/31/17 0606  NA 142 142 145  K 4.6 3.9 3.7  CL 105 107 111  CO2 27 26 26   GLUCOSE 87 92 76  BUN 19 16 16   CREATININE 1.06 0.97 1.17  CALCIUM 9.9 9.3 9.0    Liver Function Tests:  Recent Labs Lab 05/29/17 1710 05/30/17 0541  AST 31 26  ALT 44 37  ALKPHOS 95 80  BILITOT 1.3* 1.3*  PROT 7.4 6.5  ALBUMIN 3.3* 2.9*   No results for input(s): LIPASE, AMYLASE in the last 168 hours. No results for input(s): AMMONIA in the last 168 hours. Coagulation Profile: No results for input(s): INR, PROTIME in the last 168 hours. Cardiac  Enzymes: No results for input(s): CKTOTAL, CKMB, CKMBINDEX, TROPONINI in the last 168 hours. BNP (last 3 results) No results for input(s): PROBNP in the last 8760 hours. CBG: No results for input(s): GLUCAP in the last 168 hours. Studies: No results found.  Scheduled Meds: . aspirin  300 mg Rectal Daily  . enoxaparin (LOVENOX) injection  40 mg Subcutaneous Q24H  . ipratropium-albuterol  3 mL Nebulization TID  . latanoprost  1 drop Both Eyes QHS  . mouth rinse  15 mL Mouth Rinse BID  . methylPREDNISolone (SOLU-MEDROL) injection  40 mg Intravenous Q12H   Continuous Infusions: . sodium chloride 100 mL/hr at 05/31/17 0542  . ampicillin-sulbactam (UNASYN) IV 1.5 g (05/31/17 1211)   PRN Meds: ondansetron **OR** ondansetron (ZOFRAN) IV  Time spent: 40 minutes  Author: Berle Mull, MD  [facility] Pager: [phone] 05/31/2017 12:30 PM  If 7PM-7AM, please contact night-coverage at [email or web address], password [email or web address]

## 2017-06-01 DIAGNOSIS — I6300 Cerebral infarction due to thrombosis of unspecified precerebral artery: Secondary | ICD-10-CM

## 2017-06-01 LAB — CBC
HEMATOCRIT: 35.2 % — AB (ref 39.0–52.0)
HEMOGLOBIN: 12 g/dL — AB (ref 13.0–17.0)
MCH: 31.3 pg (ref 26.0–34.0)
MCHC: 34.1 g/dL (ref 30.0–36.0)
MCV: 91.7 fL (ref 78.0–100.0)
Platelets: 186 10*3/uL (ref 150–400)
RBC: 3.84 MIL/uL — AB (ref 4.22–5.81)
RDW: 15.6 % — ABNORMAL HIGH (ref 11.5–15.5)
WBC: 11.4 10*3/uL — AB (ref 4.0–10.5)

## 2017-06-01 LAB — BASIC METABOLIC PANEL
Anion gap: 10 (ref 5–15)
BUN: 21 mg/dL — AB (ref 6–20)
CALCIUM: 9.2 mg/dL (ref 8.9–10.3)
CHLORIDE: 113 mmol/L — AB (ref 101–111)
CO2: 24 mmol/L (ref 22–32)
Creatinine, Ser: 1.15 mg/dL (ref 0.61–1.24)
GFR calc non Af Amer: 57 mL/min — ABNORMAL LOW (ref >60)
GLUCOSE: 98 mg/dL (ref 65–99)
Potassium: 3.9 mmol/L (ref 3.5–5.1)
Sodium: 147 mmol/L — ABNORMAL HIGH (ref 135–145)

## 2017-06-01 LAB — MAGNESIUM: Magnesium: 2.1 mg/dL (ref 1.7–2.4)

## 2017-06-01 NOTE — Progress Notes (Signed)
Pt temp elevated bear hugger removed, will monitor pt temp.

## 2017-06-01 NOTE — Progress Notes (Addendum)
[facility] Progress Note  Patient: [name] DTO:[id number]   PCP: Dione Housekeeper, MD DOB: 09/06/1932   DOA: 05/29/2017   DOS: 06/01/2017   Date of Service: the patient was seen and examined on 06/01/2017  Subjective: Severe dysarthria noted.  No family by the bedside.   Brief hospital course: 83 year old with PMH of CVA, dementia, dysphagia, prostate cancer; admitted on 05/29/2017, presented with complaint of cough and shortness of breath, was found to have recurrent aspiration pneumonia. Currently further plan is current treatment and discussion of costal care.  Assessment and Plan: 1. Recurrent aspiration pneumonia. Acute hypoxic respiratory failure. Sepsis with hypothermia. Initially started on broad-spectrum antibiotics vancomycin/Zosyn, now on Unasyn.  . Blood cultures negative to date. Speech therapy consulted, continues to recommend remaining nothing by mouth. Mentation still not clear as well. Still requiring oxygen   Continue IV Solu-Medrol to see if that will improve patient's respiratory status. Bear hugger removed , hypothermia resolved   2. Goals of care discussion. Dysphagia. Recent CVA. Discussed at bedside with patient's brother Gwyndolyn Saxon. Palliative care continues to follow-up with the patient. Informed family that the patient remains at high risk for aspiration after his recent CVA and with his dementia as well as mentation change even though if it gets better with pneumonia will remain high risk for recurrent aspiration event down the road. Currently family wants to treat what is treatable,  monitor for another 24 hours if no improvement, consider transition to comfort care    3. Recent CVA. On aspirin suppository since the patient remains nothing by mouth Monitor.  4. Protein calorie malnutrition. In the setting of acute illness. Patient is not a candidate for PEG tube placement due to overall poor prognosis    Diet: Remains nothing by mouth DVT  Prophylaxis: subcutaneous Heparin  Advance goals of care discussion: DNR DNI  Family Communication: family was present at bedside, at the time of interview. The pt provided permission to discuss medical plan with the family. Opportunity was given to ask question and all questions were answered satisfactorily.   Disposition:  Discharge to be determined.  Consultants: palliaive care Procedures: none  Antibiotics: Anti-infectives    Start     Dose/Rate Route Frequency Ordered Stop   05/30/17 1200  ampicillin-sulbactam (UNASYN) 1.5 g in sodium chloride 0.9 % 50 mL IVPB     1.5 g 100 mL/hr over 30 Minutes Intravenous Every 6 hours 05/30/17 1146     05/30/17 0600  vancomycin (VANCOCIN) 500 mg in sodium chloride 0.9 % 100 mL IVPB  Status:  Discontinued     500 mg 100 mL/hr over 60 Minutes Intravenous Every 12 hours 05/29/17 2310 05/30/17 1111   05/30/17 0200  piperacillin-tazobactam (ZOSYN) IVPB 3.375 g  Status:  Discontinued     3.375 g 12.5 mL/hr over 240 Minutes Intravenous Every 8 hours 05/29/17 2310 05/30/17 1111   05/29/17 1815  vancomycin (VANCOCIN) IVPB 1000 mg/200 mL premix     1,000 mg 200 mL/hr over 60 Minutes Intravenous  Once 05/29/17 1810 05/29/17 1934   05/29/17 1800  piperacillin-tazobactam (ZOSYN) IVPB 3.375 g     3.375 g 100 mL/hr over 30 Minutes Intravenous  Once 05/29/17 1753 05/29/17 1825   05/29/17 0200  piperacillin-tazobactam (ZOSYN) IVPB 3.375 g  Status:  Discontinued     3.375 g 12.5 mL/hr over 240 Minutes Intravenous Every 8 hours 05/29/17 2310 05/29/17 2311       Objective: Physical Exam: Vitals:   06/01/17 0300 06/01/17 0439 06/01/17 0559  06/01/17 0729  BP:   (!) 150/73   Pulse:   83   Resp:      Temp: 98.6 F (37 C) 98.6 F (37 C) 98.8 F (37.1 C)   TempSrc: Axillary Axillary Rectal   SpO2:   100% 92%  Weight:      Height:        Intake/Output Summary (Last 24 hours) at 06/01/17 1148 Last data filed at 06/01/17 1040  Gross per 24 hour    Intake           992.92 ml  Output             1100 ml  Net          -107.08 ml   Filed Weights   05/29/17 1653 05/29/17 2254  Weight: 65.8 kg (145 lb) 62.1 kg (136 lb 14.4 oz)   General: Alert, Awake, follows commands. Appear in moderate distress, affect difficult to assess  Eyes: PERRL, Conjunctiva normal ENT: Oral Mucosa clear moist. Neck: difficult to assess JVD, no Abnormal Mass Or lumps Cardiovascular: S1 and S2 Present, no Murmur, Peripheral Pulses Present Respiratory: increased respiratory effort, Bilateral Air entry equal and Decreased, positive use of accessory muscle, bilateral Crackles, no wheezes Abdomen: Bowel Sound present, Soft and no tenderness, no hernia Skin: no redness, no Rash, no induration Extremities: no Pedal edema, no calf tenderness  Data Reviewed: CBC:  Recent Labs Lab 05/29/17 1710 05/30/17 0541 05/31/17 0606 06/01/17 0905  WBC 8.7 9.7 11.3* 11.4*  NEUTROABS 8.3*  --   --   --   HGB 13.5 12.7* 12.1* 12.0*  HCT 40.5 38.4* 35.3* 35.2*  MCV 92.5 92.5 91.5 91.7  PLT 263 234 213 660   Basic Metabolic Panel:  Recent Labs Lab 05/29/17 1710 05/30/17 0541 05/31/17 0606 06/01/17 0905  NA 142 142 145 147*  K 4.6 3.9 3.7 3.9  CL 105 107 111 113*  CO2 27 26 26 24   GLUCOSE 87 92 76 98  BUN 19 16 16  21*  CREATININE 1.06 0.97 1.17 1.15  CALCIUM 9.9 9.3 9.0 9.2  MG  --   --   --  2.1    Liver Function Tests:  Recent Labs Lab 05/29/17 1710 05/30/17 0541  AST 31 26  ALT 44 37  ALKPHOS 95 80  BILITOT 1.3* 1.3*  PROT 7.4 6.5  ALBUMIN 3.3* 2.9*   No results for input(s): LIPASE, AMYLASE in the last 168 hours. No results for input(s): AMMONIA in the last 168 hours. Coagulation Profile: No results for input(s): INR, PROTIME in the last 168 hours. Cardiac Enzymes: No results for input(s): CKTOTAL, CKMB, CKMBINDEX, TROPONINI in the last 168 hours. BNP (last 3 results) No results for input(s): PROBNP in the last 8760 hours. CBG: No  results for input(s): GLUCAP in the last 168 hours. Studies: No results found.  Scheduled Meds: . aspirin  300 mg Rectal Daily  . enoxaparin (LOVENOX) injection  40 mg Subcutaneous Q24H  . ipratropium-albuterol  3 mL Nebulization TID  . latanoprost  1 drop Both Eyes QHS  . mouth rinse  15 mL Mouth Rinse BID  . methylPREDNISolone (SOLU-MEDROL) injection  40 mg Intravenous Q12H   Continuous Infusions: . sodium chloride 75 mL/hr at 06/01/17 0439  . ampicillin-sulbactam (UNASYN) IV 1.5 g (06/01/17 1113)   PRN Meds: ondansetron **OR** ondansetron (ZOFRAN) IV  Time spent: 40 minutes  Author:  Reyne Dumas, MD [facility]   06/01/2017 11:48 AM  If 7PM-7AM, please  contact night-coverage at [email or web address], password [email or web address]

## 2017-06-02 DIAGNOSIS — F0150 Vascular dementia without behavioral disturbance: Secondary | ICD-10-CM

## 2017-06-02 DIAGNOSIS — J690 Pneumonitis due to inhalation of food and vomit: Secondary | ICD-10-CM

## 2017-06-02 DIAGNOSIS — E870 Hyperosmolality and hypernatremia: Secondary | ICD-10-CM | POA: Diagnosis present

## 2017-06-02 DIAGNOSIS — J9601 Acute respiratory failure with hypoxia: Secondary | ICD-10-CM

## 2017-06-02 LAB — BASIC METABOLIC PANEL
Anion gap: 10 (ref 5–15)
BUN: 28 mg/dL — ABNORMAL HIGH (ref 6–20)
CALCIUM: 9.3 mg/dL (ref 8.9–10.3)
CO2: 24 mmol/L (ref 22–32)
CREATININE: 1.07 mg/dL (ref 0.61–1.24)
Chloride: 116 mmol/L — ABNORMAL HIGH (ref 101–111)
GFR calc Af Amer: >60 mL/min (ref >60)
GFR calc non Af Amer: >60 mL/min (ref >60)
GLUCOSE: 97 mg/dL (ref 65–99)
Potassium: 3.8 mmol/L (ref 3.5–5.1)
Sodium: 150 mmol/L — ABNORMAL HIGH (ref 135–145)

## 2017-06-02 LAB — CBC
HEMATOCRIT: 37.3 % — AB (ref 39.0–52.0)
HEMOGLOBIN: 12.4 g/dL — AB (ref 13.0–17.0)
MCH: 30.6 pg (ref 26.0–34.0)
MCHC: 33.2 g/dL (ref 30.0–36.0)
MCV: 92.1 fL (ref 78.0–100.0)
Platelets: 188 10*3/uL (ref 150–400)
RBC: 4.05 MIL/uL — ABNORMAL LOW (ref 4.22–5.81)
RDW: 15.5 % (ref 11.5–15.5)
WBC: 9.6 10*3/uL (ref 4.0–10.5)

## 2017-06-02 MED ORDER — METHYLPREDNISOLONE SODIUM SUCC 40 MG IJ SOLR
40.0000 mg | INTRAMUSCULAR | Status: DC
  Administered 2017-06-03 (×2): 40 mg via INTRAVENOUS
  Filled 2017-06-02 (×2): qty 1

## 2017-06-02 MED ORDER — DEXTROSE 5 % IV SOLN
INTRAVENOUS | Status: AC
  Administered 2017-06-02 – 2017-06-03 (×2): via INTRAVENOUS

## 2017-06-02 NOTE — Progress Notes (Addendum)
PROGRESS NOTE                                                                                                                                                                                                             Patient Demographics:    [name], is a 83 y.o. male, DOB - 11-Nov-1931, AYT:[id number]  Admit date - 05/29/2017   Admitting Physician Oswald Hillock, MD  Outpatient Primary MD for the patient is Dione Housekeeper, MD  LOS - 3  Outpatient Specialists:None  Chief Complaint  Patient presents with  . Dysphagia       Brief Narrative   83 year old male with history of CVA, severe dementia, dysphagia, prostate cancer admitted with cough and shortness of breath and found to have recurrent aspiration pneumonia. Recently hospitalized for aspiration pneumonia about 2 weeks back and also treated for hypothermia. He was discharged to skilled nursing facility with palliative care follow-up. Patient on presentation was again hypothermic requiring Bair hugger, hypotensive which improved with IV fluids. Patient started on empiric vancomycin and Zosyn for healthcare associated/aspiration pneumonia. Palliative care on board. Overall prognosis appears poor and discussion about possible hospice.   Subjective:    Very confused. Requiring nasal cannula and remains nothing by mouth   Assessment  & Plan :   Principal problem Recurrent sepsis with aspiration pneumonia Continue empiric Unasyn and Solu-Medrol. Continue scheduled nebs. Overall prognosis is guarded. Patient remains nothing by mouth since he has failed swallow evaluation. Discussed with son at bedside. He was still discussing with his family and come with the decision. They will be able to make a decision by tomorrow. Possibly will benefit from home versus residential hospice.  Acute respiratory failure with hypoxia Continue O2 via nasal cannula. On IV Solu-Medrol and  scheduled nebs. Lung sounds appear clear. Will switch to once a day supplemental.  Recent CVA with ongoing dysphagia Continue aspirin. Overall prognosis is poor. palliative care on board.   Hypernatremia  Secondary to dehydration. Switch fluids to D5.  Severe dementia   Code Status : DO NOT RESUSCITATE  Family Communication  : Son and grandson at bedside  Disposition Plan  : Possibly needs hospice  Barriers For Discharge : Active symptoms and ongoing family discussion  Consults  :  Palliative care  Procedures  : None  DVT Prophylaxis  :  Lovenox -   Lab Results  Component Value Date   PLT 188 06/02/2017    Antibiotics  :    Anti-infectives    Start     Dose/Rate Route Frequency Ordered Stop   05/30/17 1200  ampicillin-sulbactam (UNASYN) 1.5 g in sodium chloride 0.9 % 50 mL IVPB     1.5 g 100 mL/hr over 30 Minutes Intravenous Every 6 hours 05/30/17 1146     05/30/17 0600  vancomycin (VANCOCIN) 500 mg in sodium chloride 0.9 % 100 mL IVPB  Status:  Discontinued     500 mg 100 mL/hr over 60 Minutes Intravenous Every 12 hours 05/29/17 2310 05/30/17 1111   05/30/17 0200  piperacillin-tazobactam (ZOSYN) IVPB 3.375 g  Status:  Discontinued     3.375 g 12.5 mL/hr over 240 Minutes Intravenous Every 8 hours 05/29/17 2310 05/30/17 1111   05/29/17 1815  vancomycin (VANCOCIN) IVPB 1000 mg/200 mL premix     1,000 mg 200 mL/hr over 60 Minutes Intravenous  Once 05/29/17 1810 05/29/17 1934   05/29/17 1800  piperacillin-tazobactam (ZOSYN) IVPB 3.375 g     3.375 g 100 mL/hr over 30 Minutes Intravenous  Once 05/29/17 1753 05/29/17 1825   05/29/17 0200  piperacillin-tazobactam (ZOSYN) IVPB 3.375 g  Status:  Discontinued     3.375 g 12.5 mL/hr over 240 Minutes Intravenous Every 8 hours 05/29/17 2310 05/29/17 2311        Objective:   Vitals:   06/01/17 1949 06/01/17 2032 06/02/17 0413 06/02/17 0743  BP:  136/63 (!) 170/67   Pulse:  86 (!) 47   Resp:  16 16   Temp:  97.7 F (36.5  C) (!) 97.4 F (36.3 C)   TempSrc:  Axillary Axillary   SpO2: 95% 93% 100% 93%  Weight:      Height:        Wt Readings from Last 3 Encounters:  05/29/17 62.1 kg (136 lb 14.4 oz)  05/11/17 65.8 kg (145 lb 1 oz)  04/28/17 65.8 kg (145 lb)     Intake/Output Summary (Last 24 hours) at 06/02/17 1349 Last data filed at 06/02/17 1301  Gross per 24 hour  Intake          3706.25 ml  Output             1450 ml  Net          2256.25 ml     Physical Exam  Gen: Elderly frail male, dysarthric, confused  HEENT: Temporal wasting, poor dentition, no pallor, moist mucosa, supple neck Chest: Diminished bilateral breath sounds, no added sounds CVS: Normal S1 and S2, no murmurs GI: Soft, nondistended, nontender Musculoskeletal musculoskeletal: Warm, no edema CNS: AAO 0,    Data Review:    CBC  Recent Labs Lab 05/29/17 1710 05/30/17 0541 05/31/17 0606 06/01/17 0905 06/02/17 0500  WBC 8.7 9.7 11.3* 11.4* 9.6  HGB 13.5 12.7* 12.1* 12.0* 12.4*  HCT 40.5 38.4* 35.3* 35.2* 37.3*  PLT 263 234 213 186 188  MCV 92.5 92.5 91.5 91.7 92.1  MCH 30.8 30.6 31.3 31.3 30.6  MCHC 33.3 33.1 34.3 34.1 33.2  RDW 15.3 15.5 15.6* 15.6* 15.5  LYMPHSABS 0.2*  --   --   --   --   MONOABS 0.2  --   --   --   --   EOSABS 0.0  --   --   --   --   BASOSABS 0.0  --   --   --   --  Chemistries   Recent Labs Lab 05/29/17 1710 05/30/17 0541 05/31/17 0606 06/01/17 0905 06/02/17 0500  NA 142 142 145 147* 150*  K 4.6 3.9 3.7 3.9 3.8  CL 105 107 111 113* 116*  CO2 27 26 26 24 24   GLUCOSE 87 92 76 98 97  BUN 19 16 16  21* 28*  CREATININE 1.06 0.97 1.17 1.15 1.07  CALCIUM 9.9 9.3 9.0 9.2 9.3  MG  --   --   --  2.1  --   AST 31 26  --   --   --   ALT 44 37  --   --   --   ALKPHOS 95 80  --   --   --   BILITOT 1.3* 1.3*  --   --   --    ------------------------------------------------------------------------------------------------------------------ No results for input(s): CHOL, HDL,  LDLCALC, TRIG, CHOLHDL, LDLDIRECT in the last 72 hours.  Lab Results  Component Value Date   HGBA1C 5.4 04/30/2017   ------------------------------------------------------------------------------------------------------------------ No results for input(s): TSH, T4TOTAL, T3FREE, THYROIDAB in the last 72 hours.  Invalid input(s): FREET3 ------------------------------------------------------------------------------------------------------------------ No results for input(s): VITAMINB12, FOLATE, FERRITIN, TIBC, IRON, RETICCTPCT in the last 72 hours.  Coagulation profile No results for input(s): INR, PROTIME in the last 168 hours.  No results for input(s): DDIMER in the last 72 hours.  Cardiac Enzymes No results for input(s): CKMB, TROPONINI, MYOGLOBIN in the last 168 hours.  Invalid input(s): CK ------------------------------------------------------------------------------------------------------------------ No results found for: BNP  Inpatient Medications  Scheduled Meds: . aspirin  300 mg Rectal Daily  . enoxaparin (LOVENOX) injection  40 mg Subcutaneous Q24H  . ipratropium-albuterol  3 mL Nebulization TID  . latanoprost  1 drop Both Eyes QHS  . mouth rinse  15 mL Mouth Rinse BID  . methylPREDNISolone (SOLU-MEDROL) injection  40 mg Intravenous Q12H   Continuous Infusions: . sodium chloride Stopped (06/02/17 1301)  . ampicillin-sulbactam (UNASYN) IV 1.5 g (06/02/17 1301)   PRN Meds:.ondansetron **OR** ondansetron (ZOFRAN) IV  Micro Results Recent Results (from the past 240 hour(s))  Culture, blood (Routine X 2) w Reflex to ID Panel     Status: None (Preliminary result)   Collection Time: 05/29/17  5:10 PM  Result Value Ref Range Status   Specimen Description RIGHT ANTECUBITAL  Final   Special Requests   Final    BOTTLES DRAWN AEROBIC AND ANAEROBIC Blood Culture adequate volume   Culture NO GROWTH 4 DAYS  Final   Report Status PENDING  Incomplete  Culture, blood  (Routine X 2) w Reflex to ID Panel     Status: None (Preliminary result)   Collection Time: 05/29/17  5:26 PM  Result Value Ref Range Status   Specimen Description BLOOD LEFT HAND  Final   Special Requests   Final    BOTTLES DRAWN AEROBIC AND ANAEROBIC Blood Culture adequate volume   Culture NO GROWTH 4 DAYS  Final   Report Status PENDING  Incomplete  Urine Culture     Status: None   Collection Time: 05/29/17  7:26 PM  Result Value Ref Range Status   Specimen Description URINE, CATHETERIZED  Final   Special Requests NONE  Final   Culture   Final    NO GROWTH Performed at [facility], [place] [address], [place], [place] [address]    Report Status 05/31/2017 FINAL  Final  MRSA PCR Screening     Status: None   Collection Time: 05/29/17 11:26 PM  Result Value Ref Range  Status   MRSA by PCR NEGATIVE NEGATIVE Final    Comment:        The GeneXpert MRSA Assay (FDA approved for NASAL specimens only), is one component of a comprehensive MRSA colonization surveillance program. It is not intended to diagnose MRSA infection nor to guide or monitor treatment for MRSA infections.     Radiology Reports Ct Head Wo Contrast  Result Date: 05/06/2017 CLINICAL DATA:  Hypothermia.  Altered mental status. EXAM: CT HEAD WITHOUT CONTRAST TECHNIQUE: Contiguous axial images were obtained from the base of the skull through the vertex without intravenous contrast. COMPARISON:  Head CT 04/30/2017, 04/27/2017.  Brain MRI 04/28/2017 FINDINGS: Brain: No acute intracranial hemorrhage. Small acute infarcts on prior MRI are not well seen by CT. Stable degree of atrophy and chronic small vessel ischemia. Remote lacunar infarcts in the right thalamus. No extra-axial fluid collection. Vascular: Atherosclerosis of skullbase vasculature without hyperdense vessel or abnormal calcification. Skull: No fracture or focal lesion. Sinuses/Orbits: Paranasal sinuses and mastoid air cells are clear. The visualized orbits are  unremarkable. Bilateral cataract resection. Other: None. IMPRESSION: No acute abnormality. Stable degree of atrophy and chronic small vessel ischemia. Recent small infarcts on MRI are not well seen by CT. Electronically Signed   By: Jeb Levering M.D.   On: 05/06/2017 20:36   Mr Jodene Nam Head Wo Contrast  Result Date: 05/07/2017 CLINICAL DATA:  Hypothermia. Altered mental status. History of dementia. EXAM: MRI HEAD WITHOUT CONTRAST MRA HEAD WITHOUT CONTRAST TECHNIQUE: Multiplanar, multiecho pulse sequences of the brain and surrounding structures were obtained without intravenous contrast. Angiographic images of the head were obtained using MRA technique without contrast. COMPARISON:  04/28/2017 FINDINGS: MRI HEAD FINDINGS Brain: Subacute right lateral medullary infarct appears more extensive than on prior. A subacute left cerebellar infarct on the previous study has decreased in restriction intensity. Cerebral cortical infarcts seen previously are no longer visualized. There are numerous small remote bilateral cerebellar infarcts. Remote lacunar infarct in the right thalamus. Atrophy with ventriculomegaly, especially of the temporal and occipital lobes. There is chronic confluent FLAIR hyperintensity in the cerebral white matter with remote lacunes in the bilateral centrum semiovale. The corpus callosum is thinned the setting of cerebral volume loss. Numerous foci of chronic lobar microhemorrhage. Vascular: Arterial findings below. Normal dural venous sinus flow voids. Skull and upper cervical spine: Negative for marrow lesion. Sinuses/Orbits: No acute finding.  Bilateral cataract resection. MRA HEAD FINDINGS Solitary visible left vertebral artery, stable from CTA 04/28/2017. The right vertebral artery is hypoplastic and may be occluded at the V4 segment. Mild proximal basilar atheromatous type narrowing. The right P1 segment is hypoplastic. Right P2 segment is attenuated and appears diffusely irregular, apparently  new from prior but potentially artifactual on this motion degraded study. No reversible flow limiting stenosis in the anterior circulation. There is moderate irregularity of bilateral MCA branches, again potentially related to artifact rather than atherosclerosis. IMPRESSION: 1. Subacute right lateral medullary infarct with questionable increased extent compared to 04/28/2017 MRI. Subacute infarcts in the left cerebellum and bilateral cerebral cortex are fading. No new site of ischemia; no hemorrhagic conversion. 2. Motion degraded MRA which may account for apparent newly narrowed bilateral M2 and right P2 segments when compared to CTA 04/30/2017. 3. Advanced atrophy and chronic white matter disease. Remote lobar micro hemorrhages may reflect underlying amyloid angiography. Electronically Signed   By: Monte Fantasia M.D.   On: 05/07/2017 10:31   Mr Brain Wo Contrast  Result Date: 05/07/2017 CLINICAL DATA:  Hypothermia.  Altered mental status. History of dementia. EXAM: MRI HEAD WITHOUT CONTRAST MRA HEAD WITHOUT CONTRAST TECHNIQUE: Multiplanar, multiecho pulse sequences of the brain and surrounding structures were obtained without intravenous contrast. Angiographic images of the head were obtained using MRA technique without contrast. COMPARISON:  04/28/2017 FINDINGS: MRI HEAD FINDINGS Brain: Subacute right lateral medullary infarct appears more extensive than on prior. A subacute left cerebellar infarct on the previous study has decreased in restriction intensity. Cerebral cortical infarcts seen previously are no longer visualized. There are numerous small remote bilateral cerebellar infarcts. Remote lacunar infarct in the right thalamus. Atrophy with ventriculomegaly, especially of the temporal and occipital lobes. There is chronic confluent FLAIR hyperintensity in the cerebral white matter with remote lacunes in the bilateral centrum semiovale. The corpus callosum is thinned the setting of cerebral volume loss.  Numerous foci of chronic lobar microhemorrhage. Vascular: Arterial findings below. Normal dural venous sinus flow voids. Skull and upper cervical spine: Negative for marrow lesion. Sinuses/Orbits: No acute finding.  Bilateral cataract resection. MRA HEAD FINDINGS Solitary visible left vertebral artery, stable from CTA 04/28/2017. The right vertebral artery is hypoplastic and may be occluded at the V4 segment. Mild proximal basilar atheromatous type narrowing. The right P1 segment is hypoplastic. Right P2 segment is attenuated and appears diffusely irregular, apparently new from prior but potentially artifactual on this motion degraded study. No reversible flow limiting stenosis in the anterior circulation. There is moderate irregularity of bilateral MCA branches, again potentially related to artifact rather than atherosclerosis. IMPRESSION: 1. Subacute right lateral medullary infarct with questionable increased extent compared to 04/28/2017 MRI. Subacute infarcts in the left cerebellum and bilateral cerebral cortex are fading. No new site of ischemia; no hemorrhagic conversion. 2. Motion degraded MRA which may account for apparent newly narrowed bilateral M2 and right P2 segments when compared to CTA 04/30/2017. 3. Advanced atrophy and chronic white matter disease. Remote lobar micro hemorrhages may reflect underlying amyloid angiography. Electronically Signed   By: Monte Fantasia M.D.   On: 05/07/2017 10:31   Dg Chest Port 1 View  Result Date: 05/29/2017 CLINICAL DATA:  Difficulty swallowing for several days, initial encounter EXAM: PORTABLE CHEST 1 VIEW COMPARISON:  05/08/2017 FINDINGS: Cardiac shadow is within normal limits. The lungs are well aerated bilaterally. Mild increased basilar opacities are noted bilaterally which may be related to aspiration given the clinical history. No acute bony abnormality is seen. IMPRESSION: New bibasilar opacities which may be related to aspiration Electronically Signed    By: Inez Catalina M.D.   On: 05/29/2017 17:31   Dg Chest Port 1 View  Result Date: 05/08/2017 CLINICAL DATA:  Rhonchi.  Congestion. EXAM: PORTABLE CHEST 1 VIEW COMPARISON:  05/06/2017 FINDINGS: Upright AP view of the chest was obtained. There is no focal airspace disease or pulmonary edema. There is a skin fold overlying the lateral right chest. Negative for a pneumothorax. Heart and mediastinum are with normal limits and stable. No acute bone abnormality. IMPRESSION: No active disease. Electronically Signed   By: Markus Daft M.D.   On: 05/08/2017 10:58   Dg Chest Port 1 View  Result Date: 05/06/2017 CLINICAL DATA:  Hypoxia EXAM: PORTABLE CHEST 1 VIEW COMPARISON:  April 27, 2017 FINDINGS: The heart size and mediastinal contours are stable. The aorta is tortuous. Both lungs are clear. Degenerative joint changes of the shoulders are noted. IMPRESSION: No active cardiopulmonary disease. Electronically Signed   By: Abelardo Diesel M.D.   On: 05/06/2017 18:18   Dg Swallowing Func-speech Pathology  Result Date: 05/10/2017 Clinical Impression             Pt currently with a moderate- severe oropharyngeal/ cervical esophageal phase dysphagia. Oral phase of swallow is disorganized with tongue pumping, piecemeal swallowing, delayed oral transit, and premature spill to the level of the pyriform sinuses. Pt also with reduced base of tongue retraction and occasional reduced airway closure resulting in silent aspiration of nectar-thick liquids- trace with teaspoon sip, frank with straw sips; pt able to clear most of aspirated material when cued to clear throat. Pt also with decreased UES opening and moderate-severe residuals in the pharynx; pt had hiccups during the study and there was backflow from the upper esophagus into the pharynx as well. Suspect that pt aspirated some of the residuals/ backflow as pt developed a wet vocal quality during the study; pt also expectorated significant amounts of what appeared to be barium  mixed with saliva throughout the study. Pt appeared to have greater success with puree bolus, likely from the increased sensory input with a heavier material which resulted in fewer residuals and no aspiration of puree material was observed. The quality of the study was also limited by the pt's positioning (head leaning on wall, shoulder obstructing view at times). Aspiration risk is high at this time. If plan is to continue with PO intake, the safest would be puree and pudding-thick liquids, meds crushed in puree with oral care before and after meal. Would highly recommend for the study to be repeated as the pt's hiccups appeared to increase backflow from the esophagus; additionally pt seemed more lethargic compared to visit this morning. Will continue to follow.  Swallow Evaluation Recommendations      SLP Diet Recommendations: NPO vs Dysphagia 1 (Puree) solids;Pudding thick liquid      Medication Administration: Crushed with puree   Supervision: Full assist for feeding;Full supervision/cueing for compensatory strategies   Compensations: Minimize environmental distractions;Slow rate;Small sips/bites   Postural Changes: Remain semi-upright after after feeds/meals (Comment);Seated upright at 90 degrees   Oral Care Recommendations: Oral care before and after PO       Amy Dionicia Abler, MA, CCC-SLP 05/10/2017,1:37 PM [phone]   Electronically signed by Kern Reap, CCC-SLP at 05/10/2017 1:45 PM Electronically signed by Kern Reap, CCC-SLP at 05/10/2017 1:48 PM     Time Spent in minutes  25   Louellen Molder M.D on 06/02/2017 at 1:49 PM  Between 7am to 7pm - Pager - [phone]  After 7pm go to [email or web address] - password [facility]  [facility] -  Office  [phone]

## 2017-06-02 NOTE — Progress Notes (Signed)
Pharmacy Antibiotic Note  [name] is a 83 y.o. male admitted on 05/29/2017 with pneumonia.  Pharmacy has been consulted for Unasyn dosing.  Continue with antibiotics for now. MD discussing options with family. Overall prognosis is poor.Pt remains NPO  Plan: Continue Unasyn 1.5gm IV q6hrs Monitor labs, progress, c/s  Height: 5\' 10"  (177.8 cm) Weight: 136 lb 14.4 oz (62.1 kg) IBW/kg (Calculated) : 73  Temp (24hrs), Avg:97.6 F (36.4 C), Min:97.4 F (36.3 C), Max:97.8 F (36.6 C)   Recent Labs Lab 05/29/17 1710 05/29/17 1729 05/30/17 0541 05/31/17 0606 06/01/17 0905 06/02/17 0500  WBC 8.7  --  9.7 11.3* 11.4* 9.6  CREATININE 1.06  --  0.97 1.17 1.15 1.07  LATICACIDVEN  --  1.07  --   --   --   --     Estimated Creatinine Clearance: 45.9 mL/min (by C-G formula based on SCr of 1.07 mg/dL).    No Known Allergies  Antimicrobials this admission: Vanc 7/25 >7/26 Zosyn 7/25>7/26 Unasyn 7/26 >>  Microbiology: 7/25 Blood Cx: ngtd 7/25 UCx: no growth 7/25 MRSA PCR is negative  Thank you for allowing pharmacy to be a part of this patient's care.  Isac Sarna, BS Pharm D, [place] Clinical Pharmacist Pager [phone] 06/02/2017 2:17 PM

## 2017-06-03 DIAGNOSIS — T68XXXA Hypothermia, initial encounter: Secondary | ICD-10-CM

## 2017-06-03 DIAGNOSIS — J189 Pneumonia, unspecified organism: Secondary | ICD-10-CM

## 2017-06-03 DIAGNOSIS — G9341 Metabolic encephalopathy: Secondary | ICD-10-CM

## 2017-06-03 LAB — CULTURE, BLOOD (ROUTINE X 2)
CULTURE: NO GROWTH
Culture: NO GROWTH
SPECIAL REQUESTS: ADEQUATE
Special Requests: ADEQUATE

## 2017-06-03 LAB — BASIC METABOLIC PANEL
Anion gap: 8 (ref 5–15)
BUN: 24 mg/dL — ABNORMAL HIGH (ref 6–20)
CHLORIDE: 108 mmol/L (ref 101–111)
CO2: 25 mmol/L (ref 22–32)
Calcium: 8.8 mg/dL — ABNORMAL LOW (ref 8.9–10.3)
Creatinine, Ser: 0.86 mg/dL (ref 0.61–1.24)
GFR calc Af Amer: >60 mL/min (ref >60)
GFR calc non Af Amer: >60 mL/min (ref >60)
Glucose, Bld: 106 mg/dL — ABNORMAL HIGH (ref 65–99)
Potassium: 3.4 mmol/L — ABNORMAL LOW (ref 3.5–5.1)
SODIUM: 141 mmol/L (ref 135–145)

## 2017-06-03 NOTE — Progress Notes (Addendum)
LCSW following for disposition and needs  Per pallaitive care team patient and family have opted for hospice home: [facility] Call placed to complete referral, discussed with Jenny Reichmann and sent necessary clinicals for review.  Awaiting review and bed status at hospice home. Will update MD and team once clinicals reviewed.  Completed patient has been accepted to the hospice home for admission today. Patient can be admitted after 2pm. Report:  [phone] RN updated LCSW will arrange for transport Family working with facility to complete paperwork   CSW will sign off at this time  Lane Hacker, MSW Clinical Social Work: System Wide Float Coverage for :  [phone]

## 2017-06-03 NOTE — ACP (Advance Care Planning) (Signed)
Brother, [name], is legal guardian. He elects DNR for his brother, and no PEG tube.

## 2017-06-03 NOTE — Discharge Summary (Signed)
Physician Discharge Summary  [name] QIO:[id number] DOB: [birth year] DOA: 05/29/2017  PCP: Dione Housekeeper, MD  Admit date: 05/29/2017 Discharge date: 06/03/2017  Admitted From: home Disposition: residential hospice  Recommendations for Outpatient Follow-up:  1. Follow up at hospice facility  Equipment/Devices: oxygen 2L for comfort  Discharge Condition: CODE STATUS: DNR Diet recommendation: NPO/ comfort feeds if tolerated   Discharge Diagnoses:  Principal Problem:   HCAP (healthcare-associated pneumonia)  Active Problems:   Dementia   CVA (cerebral vascular accident) ([place])   Acute metabolic encephalopathy   Aspiration pneumonia ([place])   Hypernatremia  Brief narrative/history of present illness 83 year old male with history of CVA, severe dementia, dysphagia, prostate cancer admitted with cough and shortness of breath and found to have recurrent aspiration pneumonia. Recently hospitalized for aspiration pneumonia about 2 weeks back and also treated for hypothermia. He was discharged to skilled nursing facility with palliative care follow-up. Patient on presentation was again hypothermic requiring Bair hugger, hypotensive which improved with IV fluids. Patient started on empiric vancomycin and Zosyn for healthcare associated/aspiration pneumonia. Palliative care on board. Overall prognosis appears poor and discussion about possible hospice.  Principal problem Recurrent sepsis with aspiration pneumonia Received empiric IV Unasyn and scheduled nebs. Overall prognosis is guarded. Patient has failed swallow evaluation and remains nothing by mouth since admission. After discussing prognosis with patient's brother who is the healthcare POA, they are interested in patient worrying too low residential hospice. Palliative care consult appreciated. Patient has not required any pain medications or anxiety anxiety medicine. Rest of his home medications have been discontinued. Does not need  further antibiotics.  Acute respiratory failure with hypoxia On O2 via nasal cannula. Received IV Solu-Medrol and scheduled nebs.   Recent CVA with ongoing dysphagia Continue aspirin. Overall prognosis is poor. palliative care on board.   Hypernatremia  Secondary to dehydration. Received fluids.  Advanced dementia  Was on Namenda at home. Now discontinued for comfort.     Family Communication  :  discussed with brother at bedside  Disposition Plan  :  [facility]  Consults  :  Palliative care  Procedures  : None   Discharge Instructions   Allergies as of 06/03/2017   No Known Allergies     Medication List    STOP taking these medications   aspirin 325 MG tablet   donepezil 10 MG tablet Commonly known as:  ARICEPT   furosemide 20 MG tablet Commonly known as:  LASIX   memantine 10 MG tablet Commonly known as:  NAMENDA   pantoprazole 40 MG tablet Commonly known as:  PROTONIX   RESOURCE THICKENUP CLEAR Powd   thiamine 100 MG tablet   Vitamin D 2000 units Caps     TAKE these medications   latanoprost 0.005 % ophthalmic solution Commonly known as:  XALATAN Place 1 drop into both eyes at bedtime.       No Known Allergies    Procedures/Studies: Ct Head Wo Contrast  Result Date: 05/06/2017 CLINICAL DATA:  Hypothermia.  Altered mental status. EXAM: CT HEAD WITHOUT CONTRAST TECHNIQUE: Contiguous axial images were obtained from the base of the skull through the vertex without intravenous contrast. COMPARISON:  Head CT 04/30/2017, 04/27/2017.  Brain MRI 04/28/2017 FINDINGS: Brain: No acute intracranial hemorrhage. Small acute infarcts on prior MRI are not well seen by CT. Stable degree of atrophy and chronic small vessel ischemia. Remote lacunar infarcts in the right thalamus. No extra-axial fluid collection. Vascular: Atherosclerosis of skullbase vasculature without hyperdense vessel or abnormal  calcification. Skull: No fracture or focal  lesion. Sinuses/Orbits: Paranasal sinuses and mastoid air cells are clear. The visualized orbits are unremarkable. Bilateral cataract resection. Other: None. IMPRESSION: No acute abnormality. Stable degree of atrophy and chronic small vessel ischemia. Recent small infarcts on MRI are not well seen by CT. Electronically Signed   By: Jeb Levering M.D.   On: 05/06/2017 20:36   Mr Jodene Nam Head Wo Contrast  Result Date: 05/07/2017 CLINICAL DATA:  Hypothermia. Altered mental status. History of dementia. EXAM: MRI HEAD WITHOUT CONTRAST MRA HEAD WITHOUT CONTRAST TECHNIQUE: Multiplanar, multiecho pulse sequences of the brain and surrounding structures were obtained without intravenous contrast. Angiographic images of the head were obtained using MRA technique without contrast. COMPARISON:  04/28/2017 FINDINGS: MRI HEAD FINDINGS Brain: Subacute right lateral medullary infarct appears more extensive than on prior. A subacute left cerebellar infarct on the previous study has decreased in restriction intensity. Cerebral cortical infarcts seen previously are no longer visualized. There are numerous small remote bilateral cerebellar infarcts. Remote lacunar infarct in the right thalamus. Atrophy with ventriculomegaly, especially of the temporal and occipital lobes. There is chronic confluent FLAIR hyperintensity in the cerebral white matter with remote lacunes in the bilateral centrum semiovale. The corpus callosum is thinned the setting of cerebral volume loss. Numerous foci of chronic lobar microhemorrhage. Vascular: Arterial findings below. Normal dural venous sinus flow voids. Skull and upper cervical spine: Negative for marrow lesion. Sinuses/Orbits: No acute finding.  Bilateral cataract resection. MRA HEAD FINDINGS Solitary visible left vertebral artery, stable from CTA 04/28/2017. The right vertebral artery is hypoplastic and may be occluded at the V4 segment. Mild proximal basilar atheromatous type narrowing. The right  P1 segment is hypoplastic. Right P2 segment is attenuated and appears diffusely irregular, apparently new from prior but potentially artifactual on this motion degraded study. No reversible flow limiting stenosis in the anterior circulation. There is moderate irregularity of bilateral MCA branches, again potentially related to artifact rather than atherosclerosis. IMPRESSION: 1. Subacute right lateral medullary infarct with questionable increased extent compared to 04/28/2017 MRI. Subacute infarcts in the left cerebellum and bilateral cerebral cortex are fading. No new site of ischemia; no hemorrhagic conversion. 2. Motion degraded MRA which may account for apparent newly narrowed bilateral M2 and right P2 segments when compared to CTA 04/30/2017. 3. Advanced atrophy and chronic white matter disease. Remote lobar micro hemorrhages may reflect underlying amyloid angiography. Electronically Signed   By: Monte Fantasia M.D.   On: 05/07/2017 10:31   Mr Brain Wo Contrast  Result Date: 05/07/2017 CLINICAL DATA:  Hypothermia. Altered mental status. History of dementia. EXAM: MRI HEAD WITHOUT CONTRAST MRA HEAD WITHOUT CONTRAST TECHNIQUE: Multiplanar, multiecho pulse sequences of the brain and surrounding structures were obtained without intravenous contrast. Angiographic images of the head were obtained using MRA technique without contrast. COMPARISON:  04/28/2017 FINDINGS: MRI HEAD FINDINGS Brain: Subacute right lateral medullary infarct appears more extensive than on prior. A subacute left cerebellar infarct on the previous study has decreased in restriction intensity. Cerebral cortical infarcts seen previously are no longer visualized. There are numerous small remote bilateral cerebellar infarcts. Remote lacunar infarct in the right thalamus. Atrophy with ventriculomegaly, especially of the temporal and occipital lobes. There is chronic confluent FLAIR hyperintensity in the cerebral white matter with remote lacunes  in the bilateral centrum semiovale. The corpus callosum is thinned the setting of cerebral volume loss. Numerous foci of chronic lobar microhemorrhage. Vascular: Arterial findings below. Normal dural venous sinus flow voids. Skull and upper  cervical spine: Negative for marrow lesion. Sinuses/Orbits: No acute finding.  Bilateral cataract resection. MRA HEAD FINDINGS Solitary visible left vertebral artery, stable from CTA 04/28/2017. The right vertebral artery is hypoplastic and may be occluded at the V4 segment. Mild proximal basilar atheromatous type narrowing. The right P1 segment is hypoplastic. Right P2 segment is attenuated and appears diffusely irregular, apparently new from prior but potentially artifactual on this motion degraded study. No reversible flow limiting stenosis in the anterior circulation. There is moderate irregularity of bilateral MCA branches, again potentially related to artifact rather than atherosclerosis. IMPRESSION: 1. Subacute right lateral medullary infarct with questionable increased extent compared to 04/28/2017 MRI. Subacute infarcts in the left cerebellum and bilateral cerebral cortex are fading. No new site of ischemia; no hemorrhagic conversion. 2. Motion degraded MRA which may account for apparent newly narrowed bilateral M2 and right P2 segments when compared to CTA 04/30/2017. 3. Advanced atrophy and chronic white matter disease. Remote lobar micro hemorrhages may reflect underlying amyloid angiography. Electronically Signed   By: Monte Fantasia M.D.   On: 05/07/2017 10:31   Dg Chest Port 1 View  Result Date: 05/29/2017 CLINICAL DATA:  Difficulty swallowing for several days, initial encounter EXAM: PORTABLE CHEST 1 VIEW COMPARISON:  05/08/2017 FINDINGS: Cardiac shadow is within normal limits. The lungs are well aerated bilaterally. Mild increased basilar opacities are noted bilaterally which may be related to aspiration given the clinical history. No acute bony abnormality  is seen. IMPRESSION: New bibasilar opacities which may be related to aspiration Electronically Signed   By: Inez Catalina M.D.   On: 05/29/2017 17:31   Dg Chest Port 1 View  Result Date: 05/08/2017 CLINICAL DATA:  R[place].  Congestion. EXAM: PORTABLE CHEST 1 VIEW COMPARISON:  05/06/2017 FINDINGS: Upright AP view of the chest was obtained. There is no focal airspace disease or pulmonary edema. There is a skin fold overlying the lateral right chest. Negative for a pneumothorax. Heart and mediastinum are with normal limits and stable. No acute bone abnormality. IMPRESSION: No active disease. Electronically Signed   By: Markus Daft M.D.   On: 05/08/2017 10:58   Dg Chest Port 1 View  Result Date: 05/06/2017 CLINICAL DATA:  Hypoxia EXAM: PORTABLE CHEST 1 VIEW COMPARISON:  April 27, 2017 FINDINGS: The heart size and mediastinal contours are stable. The aorta is tortuous. Both lungs are clear. Degenerative joint changes of the shoulders are noted. IMPRESSION: No active cardiopulmonary disease. Electronically Signed   By: Abelardo Diesel M.D.   On: 05/06/2017 18:18   Dg Swallowing Func-speech Pathology  Result Date: 05/10/2017 Clinical Impression             Pt currently with a moderate- severe oropharyngeal/ cervical esophageal phase dysphagia. Oral phase of swallow is disorganized with tongue pumping, piecemeal swallowing, delayed oral transit, and premature spill to the level of the pyriform sinuses. Pt also with reduced base of tongue retraction and occasional reduced airway closure resulting in silent aspiration of nectar-thick liquids- trace with teaspoon sip, frank with straw sips; pt able to clear most of aspirated material when cued to clear throat. Pt also with decreased UES opening and moderate-severe residuals in the pharynx; pt had hiccups during the study and there was backflow from the upper esophagus into the pharynx as well. Suspect that pt aspirated some of the residuals/ backflow as pt developed a wet  vocal quality during the study; pt also expectorated significant amounts of what appeared to be barium mixed with saliva throughout the  study. Pt appeared to have greater success with puree bolus, likely from the increased sensory input with a heavier material which resulted in fewer residuals and no aspiration of puree material was observed. The quality of the study was also limited by the pt's positioning (head leaning on wall, shoulder obstructing view at times). Aspiration risk is high at this time. If plan is to continue with PO intake, the safest would be puree and pudding-thick liquids, meds crushed in puree with oral care before and after meal. Would highly recommend for the study to be repeated as the pt's hiccups appeared to increase backflow from the esophagus; additionally pt seemed more lethargic compared to visit this morning. Will continue to follow.  Swallow Evaluation Recommendations      SLP Diet Recommendations: NPO vs Dysphagia 1 (Puree) solids;Pudding thick liquid      Medication Administration: Crushed with puree   Supervision: Full assist for feeding;Full supervision/cueing for compensatory strategies   Compensations: Minimize environmental distractions;Slow rate;Small sips/bites   Postural Changes: Remain semi-upright after after feeds/meals (Comment);Seated upright at 90 degrees   Oral Care Recommendations: Oral care before and after PO       Amy Dionicia Abler, MA, CCC-SLP 05/10/2017,1:37 PM [phone]   Electronically signed by Kern Reap, CCC-SLP at 05/10/2017 1:45 PM Electronically signed by Kern Reap, CCC-SLP at 05/10/2017 1:48 PM        Subjective: Remains confused. Normal acute overnight event  Discharge Exam: Vitals:   06/02/17 2149 06/03/17 0600  BP: 134/79 (!) 155/73  Pulse: 71 (!) 45  Resp: 18 18  Temp: (!) 97.5 F (36.4 C) 97.6 F (36.4 C)   Vitals:   06/02/17 1951 06/02/17 2149 06/03/17 0600 06/03/17 0724  BP:  134/79 (!) 155/73   Pulse:  71 (!)  45   Resp:  18 18   Temp:  (!) 97.5 F (36.4 C) 97.6 F (36.4 C)   TempSrc:  Oral Oral   SpO2: 98% 100% 99% 98%  Weight:      Height:        Gen: Elderly frail male, dysarthric, confused  HEENT: Temporal wasting, poor dentition, no pallor, dry mucosa, supple neck Chest: Diminished bilateral breath sounds, no added sounds CVS: Normal S1 and S2, no murmurs GI: Soft, nondistended, nontender Musculoskeletal : Warm, no edema CNS: AAO 0,  The results of significant diagnostics from this hospitalization (including imaging, microbiology, ancillary and laboratory) are listed below for reference.     Microbiology: Recent Results (from the past 240 hour(s))  Culture, blood (Routine X 2) w Reflex to ID Panel     Status: None   Collection Time: 05/29/17  5:10 PM  Result Value Ref Range Status   Specimen Description RIGHT ANTECUBITAL  Final   Special Requests   Final    BOTTLES DRAWN AEROBIC AND ANAEROBIC Blood Culture adequate volume   Culture NO GROWTH 5 DAYS  Final   Report Status 06/03/2017 FINAL  Final  Culture, blood (Routine X 2) w Reflex to ID Panel     Status: None   Collection Time: 05/29/17  5:26 PM  Result Value Ref Range Status   Specimen Description BLOOD LEFT HAND  Final   Special Requests   Final    BOTTLES DRAWN AEROBIC AND ANAEROBIC Blood Culture adequate volume   Culture NO GROWTH 5 DAYS  Final   Report Status 06/03/2017 FINAL  Final  Urine Culture     Status: None   Collection Time: 05/29/17  7:26 PM  Result Value Ref Range Status   Specimen Description URINE, CATHETERIZED  Final   Special Requests NONE  Final   Culture   Final    NO GROWTH Performed at [facility], [address] [place], [place] [address]    Report Status 05/31/2017 FINAL  Final  MRSA PCR Screening     Status: None   Collection Time: 05/29/17 11:26 PM  Result Value Ref Range Status   MRSA by PCR NEGATIVE NEGATIVE Final    Comment:        The GeneXpert MRSA Assay (FDA approved  for NASAL specimens only), is one component of a comprehensive MRSA colonization surveillance program. It is not intended to diagnose MRSA infection nor to guide or monitor treatment for MRSA infections.      Labs: BNP (last 3 results) No results for input(s): BNP in the last 8760 hours. Basic Metabolic Panel:  Recent Labs Lab 05/29/17 1710 05/30/17 0541 05/31/17 0606 06/01/17 0905 06/02/17 0500  NA 142 142 145 147* 150*  K 4.6 3.9 3.7 3.9 3.8  CL 105 107 111 113* 116*  CO2 27 26 26 24 24   GLUCOSE 87 92 76 98 97  BUN 19 16 16  21* 28*  CREATININE 1.06 0.97 1.17 1.15 1.07  CALCIUM 9.9 9.3 9.0 9.2 9.3  MG  --   --   --  2.1  --    Liver Function Tests:  Recent Labs Lab 05/29/17 1710 05/30/17 0541  AST 31 26  ALT 44 37  ALKPHOS 95 80  BILITOT 1.3* 1.3*  PROT 7.4 6.5  ALBUMIN 3.3* 2.9*   No results for input(s): LIPASE, AMYLASE in the last 168 hours. No results for input(s): AMMONIA in the last 168 hours. CBC:  Recent Labs Lab 05/29/17 1710 05/30/17 0541 05/31/17 0606 06/01/17 0905 06/02/17 0500  WBC 8.7 9.7 11.3* 11.4* 9.6  NEUTROABS 8.3*  --   --   --   --   HGB 13.5 12.7* 12.1* 12.0* 12.4*  HCT 40.5 38.4* 35.3* 35.2* 37.3*  MCV 92.5 92.5 91.5 91.7 92.1  PLT 263 234 213 186 188   Cardiac Enzymes: No results for input(s): CKTOTAL, CKMB, CKMBINDEX, TROPONINI in the last 168 hours. BNP: Invalid input(s): POCBNP CBG: No results for input(s): GLUCAP in the last 168 hours. D-Dimer No results for input(s): DDIMER in the last 72 hours. Hgb A1c No results for input(s): HGBA1C in the last 72 hours. Lipid Profile No results for input(s): CHOL, HDL, LDLCALC, TRIG, CHOLHDL, LDLDIRECT in the last 72 hours. Thyroid function studies No results for input(s): TSH, T4TOTAL, T3FREE, THYROIDAB in the last 72 hours.  Invalid input(s): FREET3 Anemia work up No results for input(s): VITAMINB12, FOLATE, FERRITIN, TIBC, IRON, RETICCTPCT in the last 72  hours. Urinalysis    Component Value Date/Time   COLORURINE YELLOW 05/29/2017 1925   APPEARANCEUR HAZY (A) 05/29/2017 1925   LABSPEC 1.025 05/29/2017 1925   PHURINE 5.0 05/29/2017 1925   GLUCOSEU NEGATIVE 05/29/2017 1925   HGBUR NEGATIVE 05/29/2017 1925   BILIRUBINUR NEGATIVE 05/29/2017 1925   KETONESUR 5 (A) 05/29/2017 1925   PROTEINUR 30 (A) 05/29/2017 1925   UROBILINOGEN 1.0 12/22/2007 1456   NITRITE NEGATIVE 05/29/2017 1925   LEUKOCYTESUR NEGATIVE 05/29/2017 1925   Sepsis Labs Invalid input(s): PROCALCITONIN,  WBC,  LACTICIDVEN Microbiology Recent Results (from the past 240 hour(s))  Culture, blood (Routine X 2) w Reflex to ID Panel     Status: None  Collection Time: 05/29/17  5:10 PM  Result Value Ref Range Status   Specimen Description RIGHT ANTECUBITAL  Final   Special Requests   Final    BOTTLES DRAWN AEROBIC AND ANAEROBIC Blood Culture adequate volume   Culture NO GROWTH 5 DAYS  Final   Report Status 06/03/2017 FINAL  Final  Culture, blood (Routine X 2) w Reflex to ID Panel     Status: None   Collection Time: 05/29/17  5:26 PM  Result Value Ref Range Status   Specimen Description BLOOD LEFT HAND  Final   Special Requests   Final    BOTTLES DRAWN AEROBIC AND ANAEROBIC Blood Culture adequate volume   Culture NO GROWTH 5 DAYS  Final   Report Status 06/03/2017 FINAL  Final  Urine Culture     Status: None   Collection Time: 05/29/17  7:26 PM  Result Value Ref Range Status   Specimen Description URINE, CATHETERIZED  Final   Special Requests NONE  Final   Culture   Final    NO GROWTH Performed at [facility], [place] [address] [place], [place] [address]    Report Status 05/31/2017 FINAL  Final  MRSA PCR Screening     Status: None   Collection Time: 05/29/17 11:26 PM  Result Value Ref Range Status   MRSA by PCR NEGATIVE NEGATIVE Final    Comment:        The GeneXpert MRSA Assay (FDA approved for NASAL specimens only), is one component of a comprehensive  MRSA colonization surveillance program. It is not intended to diagnose MRSA infection nor to guide or monitor treatment for MRSA infections.      Time coordinating discharge: Over 30 minutes  SIGNED:   Louellen Molder, MD  [facility] 06/03/2017, 11:53 AM Pager   If 7PM-7AM, please contact night-coverage [email or web address] Password [email or web address]

## 2017-06-03 NOTE — Progress Notes (Signed)
Patient transported to [facility] via [facility].

## 2017-06-03 NOTE — Progress Notes (Signed)
Called report to [facility]. Removed patient's IV.  Awaiting transport by EMS.

## 2017-06-03 NOTE — Progress Notes (Signed)
Daily Progress Note   Patient Name: [name]       Date: 06/03/2017 DOB: 1932/02/07  Age: 83 y.o. MRN#: [id number] Attending Physician: Louellen Molder, MD Primary Care Physician: Dione Housekeeper, MD Admit Date: 05/29/2017  Reason for Consultation/Follow-up: Establishing goals of care, Inpatient hospice referral and Psychosocial/spiritual support  Subjective: [name] is resting quietly in bed. He is able to make and keep eye contact, and tell me his name, but not where we are. If I ask if he is hungry, and he tells me clearly "no". There is no family of bedside at this time. He call to legal guardian/brother [name]. Spoke with his wife Steward Drone (hospice volunteer for 29 years) who tells me that the family is ready for hospice home if that's the recommendation of the medical team. Call to [facility] on his cell phone. We talk about [name]'s current health concerns, continued aspiration, in PO status for many days now. We talk about PEG tube benefits and burdens. [name] continues to state that PEG tube will not change long-term outcomes for his brother. I share that the medical team recommends comfort and dignity at end-of-life, hospice home of [facility]. We talk about giving pleasure foods, but when aspiration reoccurs, not transferring back to the hospital, [name] will spend his last days in the hospice home. Ted agrees, no further questions at this time.  Call to hospice home of [facility], spoke with [place], [place]. Discuss [name]'s case.   [name] has recurrent aspiration pneumonia, treated with IV antibiotics.  he has filled multiple speech therapy evaluation, swallowing studies and is likely to continue to aspirate. [name] has been in PO for many days now. His  legal guardian/brother [name] continues to decline PEG tube. They are accepting of placement in [facility] home for comfort and dignity at end-of-life.  Length of Stay: 4  Current Medications: Scheduled Meds:  . aspirin  300 mg Rectal Daily  . enoxaparin (LOVENOX) injection  40 mg Subcutaneous Q24H  . ipratropium-albuterol  3 mL Nebulization TID  . latanoprost  1 drop Both Eyes QHS  . mouth rinse  15 mL Mouth Rinse BID  . methylPREDNISolone (SOLU-MEDROL) injection  40 mg Intravenous Q2H    Continuous Infusions: . ampicillin-sulbactam (UNASYN) IV Stopped (06/03/17 0545)  .  dextrose 75 mL/hr at 06/03/17 0515    PRN Meds: ondansetron **OR** ondansetron (ZOFRAN) IV  Physical Exam  Constitutional: No distress.  Makes and keeps eye contact, calm and cooperative  HENT:  Head: Atraumatic.  Cardiovascular: Normal rate and regular rhythm.   Pulmonary/Chest: Effort normal. No respiratory distress.  Abdominal: Soft. He exhibits no distension.  Musculoskeletal: He exhibits no edema.  Neurological: He is alert.  Oriented to self only  Skin: Skin is warm and dry.  Psychiatric:  Known dementia  Nursing note and vitals reviewed.           Vital Signs: BP (!) 155/73 (BP Location: Right Arm)   Pulse (!) 45   Temp 97.6 F (36.4 C) (Oral)   Resp 18   Ht 5\' 10"  (1.778 m)   Wt 62.1 kg (136 lb 14.4 oz)   SpO2 98%   BMI 19.64 kg/m  SpO2: SpO2: 98 % O2 Device: O2 Device: Nasal Cannula O2 Flow Rate: O2 Flow Rate (L/min): 2 L/min  Intake/output summary:  Intake/Output Summary (Last 24 hours) at 06/03/17 1136 Last data filed at 06/03/17 3220  Gross per 24 hour  Intake           753.75 ml  Output              800 ml  Net           -46.25 ml   LBM: Last BM Date: 06/02/17 Baseline Weight: Weight: 65.8 kg (145 lb) Most recent weight: Weight: 62.1 kg (136 lb 14.4 oz)       Palliative Assessment/Data:      Patient Active Problem List   Diagnosis Date Noted  .  Hypernatremia   . Aspiration pneumonia ([place]) 05/30/2017  . HCAP (healthcare-associated pneumonia) 05/29/2017  . Acute metabolic encephalopathy 25/42/7062  . Encounter for hospice care discussion   . Palliative care encounter   . Goals of care, counseling/discussion   . Aspiration pneumonitis ([place])   . Hypothermia 05/06/2017  . Episode of unresponsiveness   . CVA (cerebral vascular accident) ([place]) 04/30/2017  . UTI (urinary tract infection) 04/28/2017  . Renal failure syndrome   . Syncope and collapse 04/27/2017  . Dehydration 04/27/2017  . Dementia 04/27/2017  . Rhabdomyolysis 04/27/2017  . Femur fracture, left ([place]) 07/03/2014  . Bradycardia 07/03/2014  . Abdominal pain, other specified site 10/14/2013  . Nausea & vomiting 10/14/2013    Palliative Care Assessment & Plan   Patient Profile: 83 year old man admitted from skilled nursing facility on 7/25 due to hypoxemia and difficulty breathing. oxygen saturation of 84% on room air. He had a recent CVA with dysphagia (family elecets no PEG tube) recently hospitalized for aspiration pneumonia and hypothermia (3 weeks ago). He presents again with the same today.  Assessment: Aspiration pneumonia; recurrent aspiration pneumonia, currently being treated with IV antibiotics. Likely to continue to aspirate. [name] has failed multiple swallowing studies, he has been in PO for many days now. His legal guardian/brother [name] continues to decline PEG tube. They are accepting of placement in [facility] home for comfort and dignity at end-of-life. Hypothermia;related to underlying infection versus sequela of stroke. [name] has had a workup for infection including urinalysis, blood cultures, chest x-ray.  Possibly his hypothermia is related to stroke. recent stroke;[name] had been living in his own home with a 5 hour day caregiver until a recent stroke. He has had dysphasia and mobility problems since. He was transferred to  [facility] skilled  nursing home for rehab, but found to have low body temperature, returned to [facility] hospital for evaluation.  Recommendations/Plan:  hospice home of [facility] for comfort and dignity at end-of-life  Goals of Care and Additional Recommendations:  Limitations on Scope of Treatment: Full Comfort Care  Code Status:    Code Status Orders        Start     Ordered   05/29/17 2229  Do not attempt resuscitation (DNR)  Continuous    Question Answer Comment  In the event of cardiac or respiratory ARREST Do not call a "code blue"   In the event of cardiac or respiratory ARREST Do not perform Intubation, CPR, defibrillation or ACLS   In the event of cardiac or respiratory ARREST Use medication by any route, position, wound care, and other measures to relive pain and suffering. May use oxygen, suction and manual treatment of airway obstruction as needed for comfort.      05/29/17 2228    Code Status History    Date Active Date Inactive Code Status Order ID Comments User Context   05/06/2017 10:57 PM 05/14/2017  4:58 PM DNR [id number]  Karmen Bongo, MD Inpatient   05/06/2017  6:20 PM 05/06/2017 10:57 PM DNR [id number]  Fredia Sorrow, MD ED   04/27/2017 11:18 PM 05/02/2017  8:31 PM DNR [id number]  Orvan Falconer, MD Inpatient   07/05/2014  8:46 PM 07/07/2014  8:14 PM Full Code [id number]  Marianna Payment, MD Inpatient   07/03/2014 11:41 PM 07/05/2014  8:46 PM Full Code [id number]  Leone Haven, MD Inpatient   07/03/2014 11:07 PM 07/03/2014 11:41 PM Full Code [id number]  Janora Norlander, DO Inpatient    Advance Directive Documentation     Most Recent Value  Type of Advance Directive  Healthcare Power of Attorney  Pre-existing out of facility DNR order (yellow form or pink MOST form)  -  "MOST" Form in Place?  -       Prognosis:   < 2 weeks, likely based on recurrent aspiration, NPO status with no desire for PEG tube, frailty.   Discharge Planning:  [facility] home for comfort and dignity at end-of-life.  Care plan was discussed with nursing staff, case manager, social worker, and Dr. Clementeen Graham  Thank you for allowing the Palliative Medicine Team to assist in the care of this patient.   Time In: 1005 Time Out: 1045 Total Time 40 minutes Prolonged Time Billed  no       Greater than 50%  of this time was spent counseling and coordinating care related to the above assessment and plan.  Drue Novel, NP  Please contact Palliative Medicine Team phone at [phone] for questions and concerns.

## 2017-06-11 ENCOUNTER — Encounter: Payer: Self-pay | Admitting: Neurology

## 2017-07-06 DEATH — deceased

## 2017-07-09 ENCOUNTER — Encounter: Payer: Self-pay | Admitting: Neurology

## 2017-07-23 ENCOUNTER — Encounter: Payer: Self-pay | Admitting: Neurology

## 2017-08-06 ENCOUNTER — Encounter: Payer: Self-pay | Admitting: Neurology

## 2017-08-07 ENCOUNTER — Encounter: Payer: Self-pay | Admitting: Neurology

## 2017-09-05 ENCOUNTER — Ambulatory Visit: Payer: Medicare Other | Admitting: Neurology

## 2018-06-11 IMAGING — CT CT HEAD W/O CM
3 series · 16 of 47 positions shown, 19 images · non-contrast
Comparison: Head CT 04/30/2017, 04/27/2017.  Brain MRI 04/28/2017

CLINICAL DATA: Hypothermia.  Altered mental status.

EXAM:
CT HEAD WITHOUT CONTRAST
TECHNIQUE: Contiguous axial images were obtained from the base of the skull
through the vertex without intravenous contrast.

[Series 2: head wo · axial · 0.47mm/px · z∈[+1469,+1609]mm · 10 of 34 slices shown, 13 images]
[im 3/34  brain]
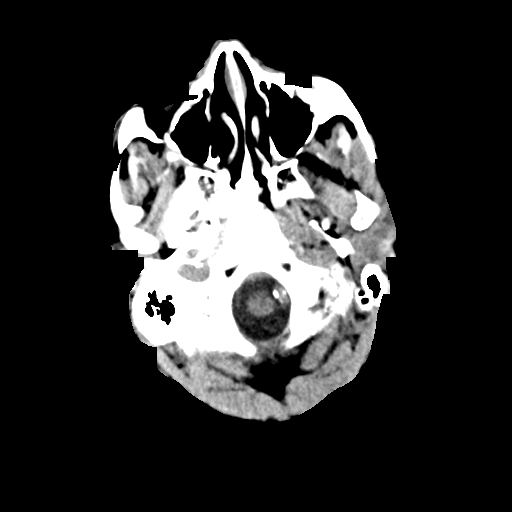
[im 3/34  bone]
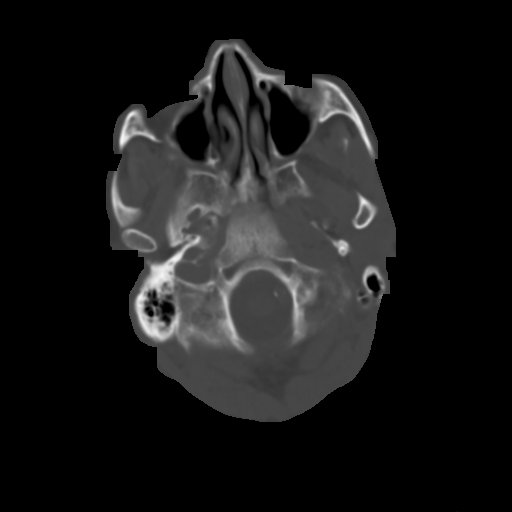
[im 6/34  brain]
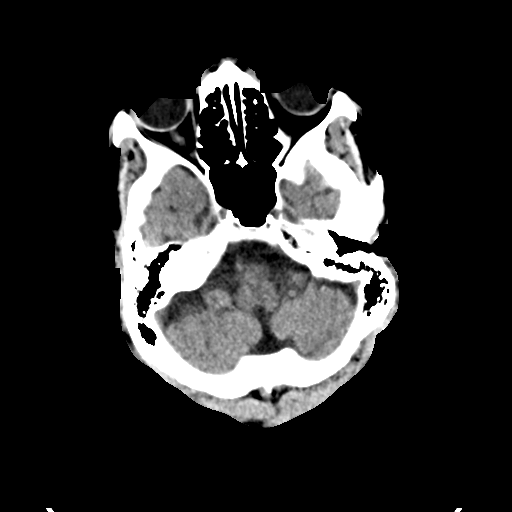
[im 10/34  brain]
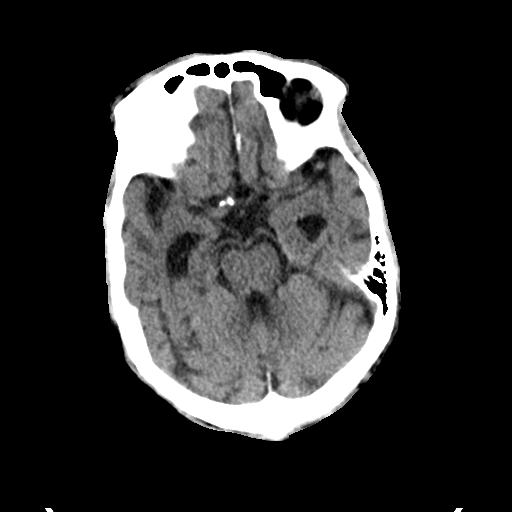
[im 12/34  brain]
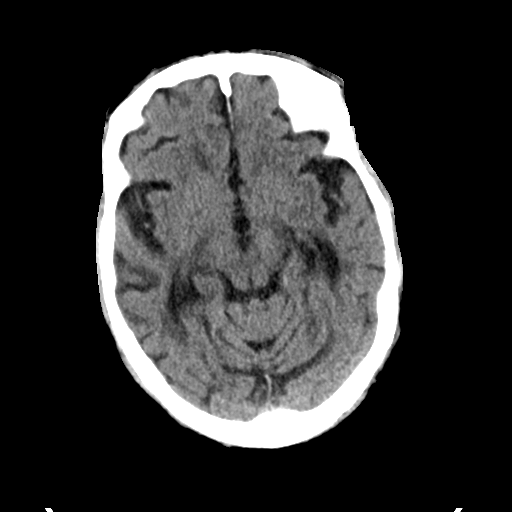
[im 15/34  brain]
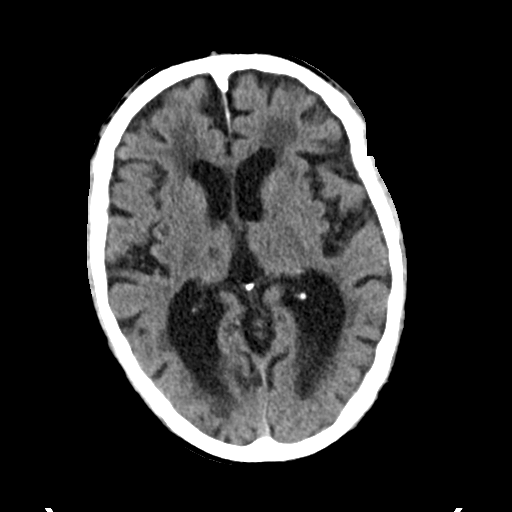
[im 15/34  bone]
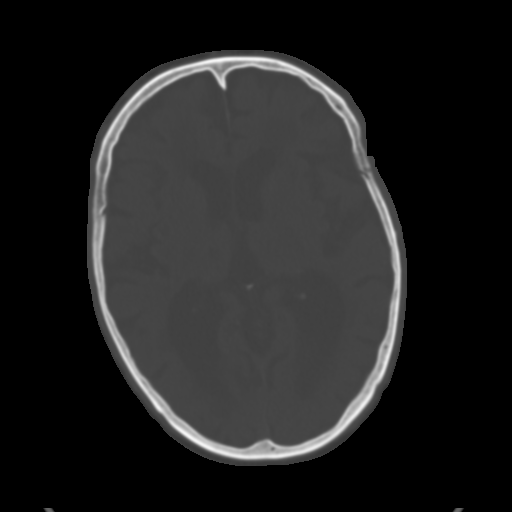
[im 19/34  brain]
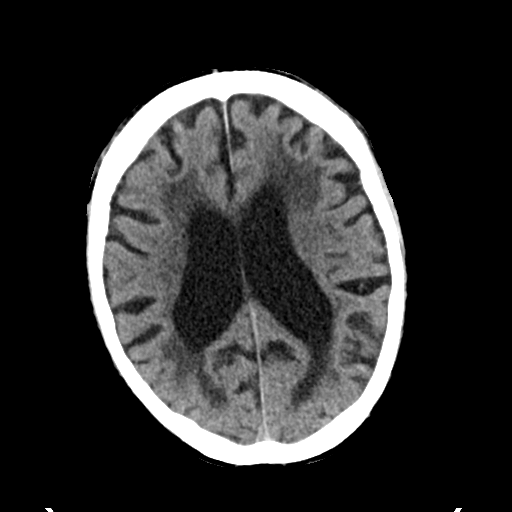
[im 22/34  brain]
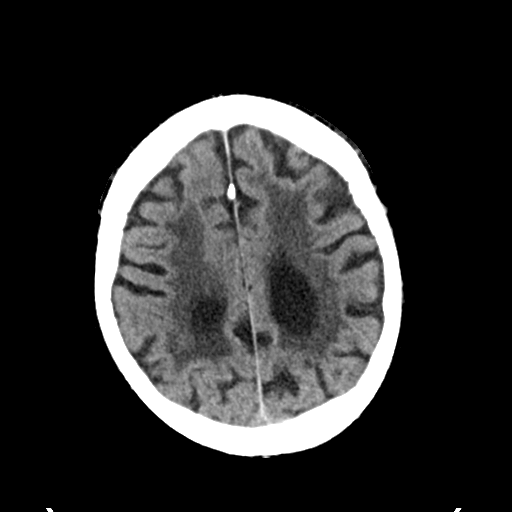
[im 26/34  brain]
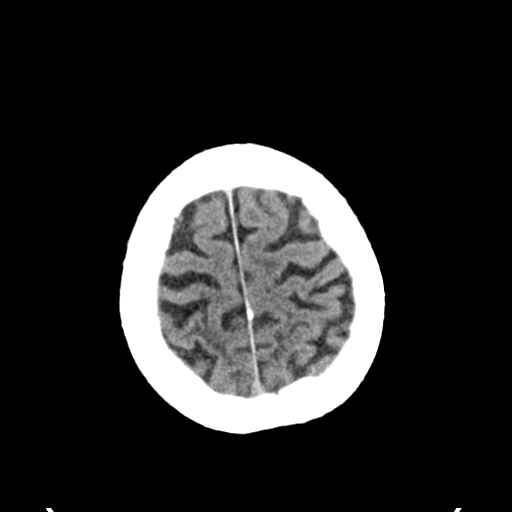
[im 28/34  brain]
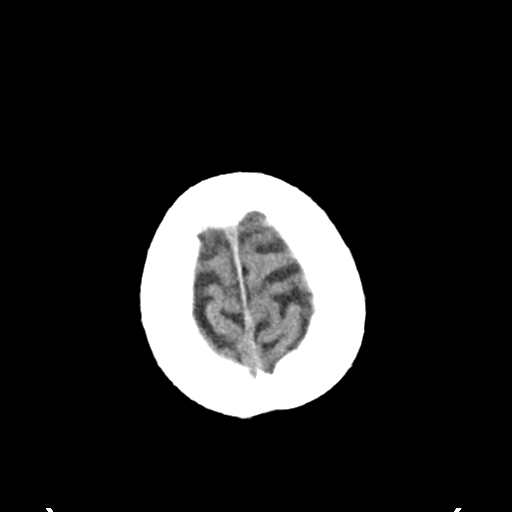
[im 28/34  bone]
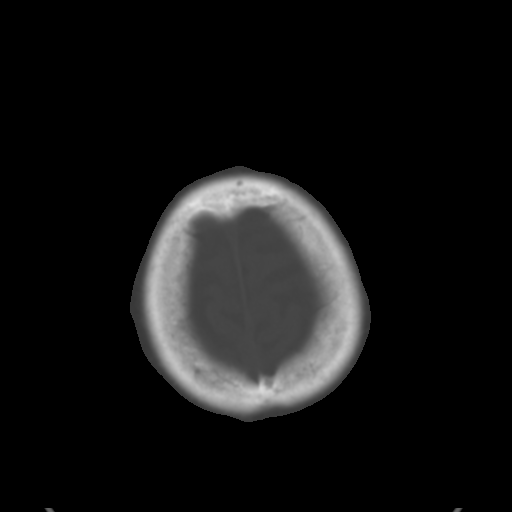
[im 31/34  brain]
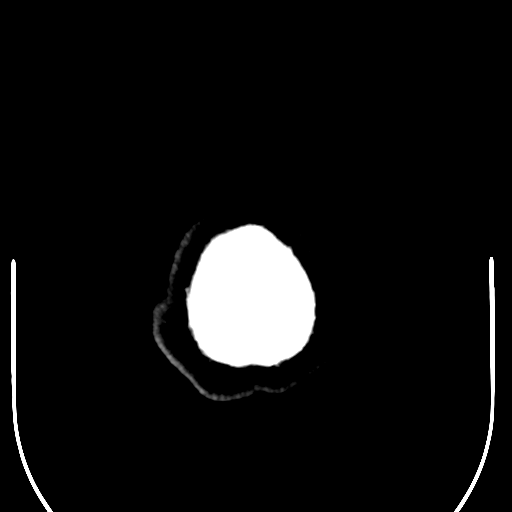

[Series 4: coronal soft tissue · coronal · 0.34mm/px · 3 of 72 slices shown]
[im 24/72  brain]
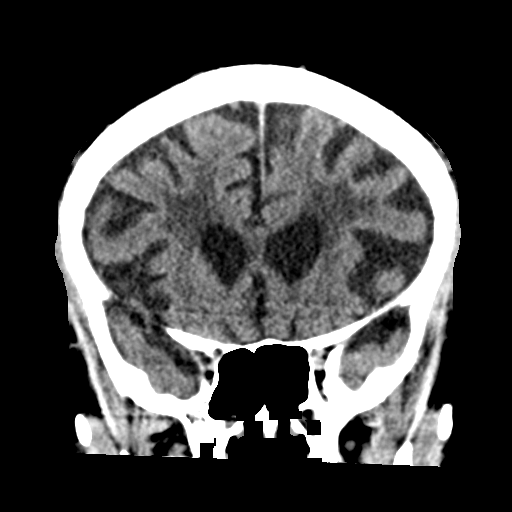
[im 32/72  brain]
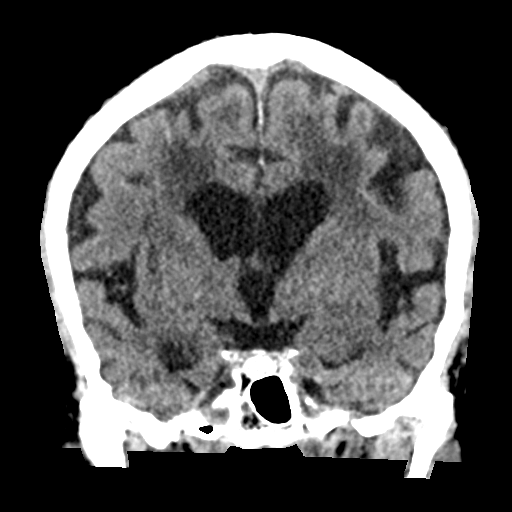
[im 40/72  brain]
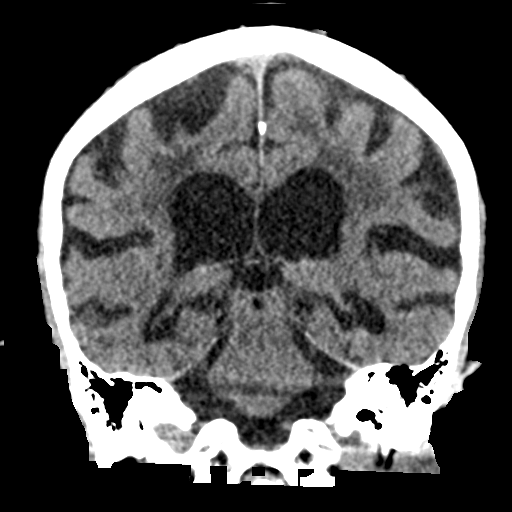

[Series 5: sagittal soft tissue · sagittal · 0.32mm/px · 3 of 57 slices shown]
[im 19/57  brain]
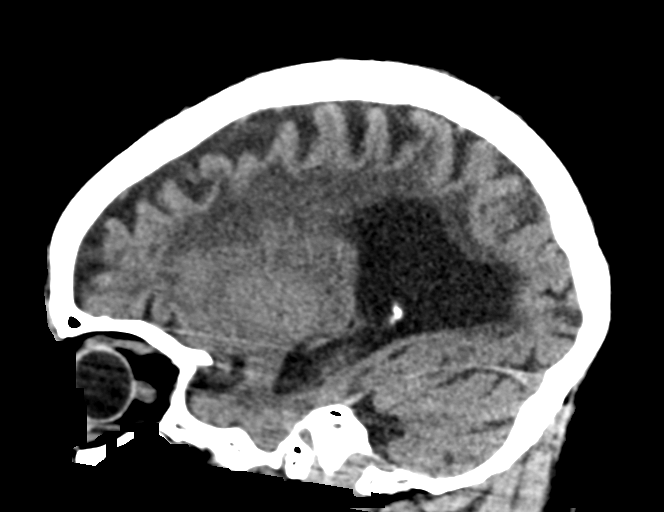
[im 29/57  brain]
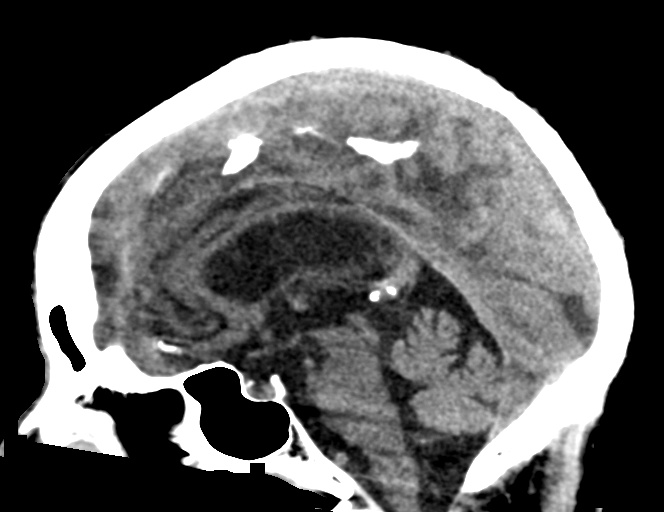
[im 38/57  brain]
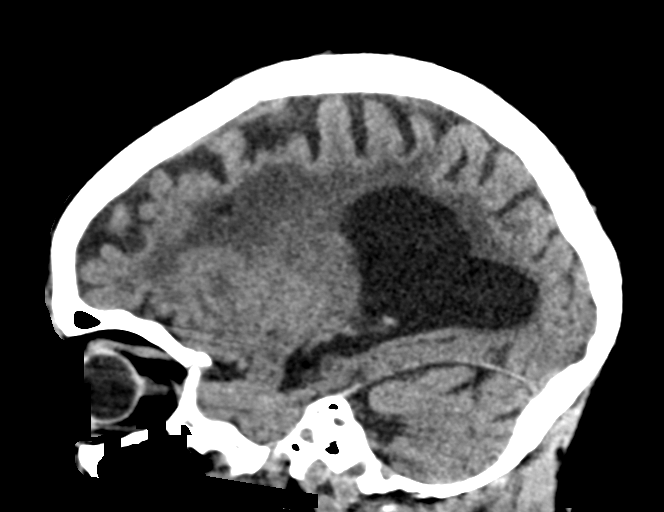

[16 of 47 positions shown; findings below may reference images not displayed]

FINDINGS: Brain: No acute intracranial hemorrhage. Small acute infarcts on
prior MRI are not well seen by CT. Stable degree of atrophy and
chronic small vessel ischemia. Remote lacunar infarcts in the right
thalamus. No extra-axial fluid collection.

Vascular: Atherosclerosis of skullbase vasculature without
hyperdense vessel or abnormal calcification.

Skull: No fracture or focal lesion.

Sinuses/Orbits: Paranasal sinuses and mastoid air cells are clear.
The visualized orbits are unremarkable. Bilateral cataract
resection.

Other: None.
IMPRESSION: No acute abnormality. Stable degree of atrophy and chronic small
vessel ischemia. Recent small infarcts on MRI are not well seen by
CT.
